# Patient Record
Sex: Male | Born: 1965 | Race: Black or African American | Hispanic: No | Marital: Single | Smoking: Former smoker
Health system: Southern US, Community
[De-identification: ages and names within clinical notes are randomized; demographics above are authoritative.]

## PROBLEM LIST (undated history)

## (undated) DIAGNOSIS — S82892A Other fracture of left lower leg, initial encounter for closed fracture: Secondary | ICD-10-CM

## (undated) DIAGNOSIS — Z7901 Long term (current) use of anticoagulants: Secondary | ICD-10-CM

## (undated) DIAGNOSIS — I10 Essential (primary) hypertension: Secondary | ICD-10-CM

## (undated) DIAGNOSIS — I639 Cerebral infarction, unspecified: Secondary | ICD-10-CM

## (undated) DIAGNOSIS — E109 Type 1 diabetes mellitus without complications: Secondary | ICD-10-CM

## (undated) DIAGNOSIS — I6930 Unspecified sequelae of cerebral infarction: Secondary | ICD-10-CM

## (undated) DIAGNOSIS — Z972 Presence of dental prosthetic device (complete) (partial): Secondary | ICD-10-CM

## (undated) DIAGNOSIS — N183 Chronic kidney disease, stage 3 unspecified: Secondary | ICD-10-CM

## (undated) DIAGNOSIS — M86162 Other acute osteomyelitis, left tibia and fibula: Secondary | ICD-10-CM

## (undated) DIAGNOSIS — E785 Hyperlipidemia, unspecified: Secondary | ICD-10-CM

## (undated) DIAGNOSIS — K08109 Complete loss of teeth, unspecified cause, unspecified class: Secondary | ICD-10-CM

## (undated) HISTORY — PX: MULTIPLE TOOTH EXTRACTIONS: SHX2053

---

## 2001-06-13 HISTORY — PX: CLOSED REDUCTION FINGER WITH PERCUTANEOUS PINNING: SHX5612

## 2008-06-13 DIAGNOSIS — I6930 Unspecified sequelae of cerebral infarction: Secondary | ICD-10-CM

## 2008-06-13 HISTORY — DX: Unspecified sequelae of cerebral infarction: I69.30

## 2008-09-09 DIAGNOSIS — I69959 Hemiplegia and hemiparesis following unspecified cerebrovascular disease affecting unspecified side: Secondary | ICD-10-CM | POA: Insufficient documentation

## 2010-07-14 ENCOUNTER — Encounter (INDEPENDENT_AMBULATORY_CARE_PROVIDER_SITE_OTHER): Payer: Self-pay | Admitting: Internal Medicine

## 2010-07-14 ENCOUNTER — Encounter: Payer: Self-pay | Admitting: Internal Medicine

## 2010-07-14 DIAGNOSIS — I10 Essential (primary) hypertension: Secondary | ICD-10-CM | POA: Insufficient documentation

## 2010-07-14 DIAGNOSIS — N189 Chronic kidney disease, unspecified: Secondary | ICD-10-CM | POA: Insufficient documentation

## 2010-07-14 DIAGNOSIS — E785 Hyperlipidemia, unspecified: Secondary | ICD-10-CM | POA: Insufficient documentation

## 2010-07-14 DIAGNOSIS — E109 Type 1 diabetes mellitus without complications: Secondary | ICD-10-CM | POA: Insufficient documentation

## 2010-07-14 LAB — CONVERTED CEMR LAB
Creatinine, Urine: 114.7 mg/dL
Microalb Creat Ratio: 33.1 mg/g — ABNORMAL HIGH (ref 0.0–30.0)

## 2010-07-20 ENCOUNTER — Encounter (INDEPENDENT_AMBULATORY_CARE_PROVIDER_SITE_OTHER): Payer: Self-pay | Admitting: Internal Medicine

## 2010-07-20 LAB — CONVERTED CEMR LAB
ALT: 39 units/L (ref 0–53)
Basophils Absolute: 0.1 10*3/uL (ref 0.0–0.1)
CO2: 23 meq/L (ref 19–32)
Calcium: 9.9 mg/dL (ref 8.4–10.5)
Chloride: 101 meq/L (ref 96–112)
Cholesterol: 229 mg/dL — ABNORMAL HIGH (ref 0–200)
Creatinine, Ser: 1.82 mg/dL — ABNORMAL HIGH (ref 0.40–1.50)
Hemoglobin: 12.5 g/dL — ABNORMAL LOW (ref 13.0–17.0)
Lymphocytes Relative: 18 % (ref 12–46)
Lymphs Abs: 1.6 10*3/uL (ref 0.7–4.0)
Monocytes Absolute: 0.6 10*3/uL (ref 0.1–1.0)
Monocytes Relative: 7 % (ref 3–12)
Neutro Abs: 6.7 10*3/uL (ref 1.7–7.7)
RBC: 4.46 M/uL (ref 4.22–5.81)
RDW: 13.6 % (ref 11.5–15.5)
Total CHOL/HDL Ratio: 4.2
WBC: 9.1 10*3/uL (ref 4.0–10.5)

## 2010-07-28 ENCOUNTER — Telehealth (INDEPENDENT_AMBULATORY_CARE_PROVIDER_SITE_OTHER): Payer: Self-pay | Admitting: Internal Medicine

## 2010-07-28 DIAGNOSIS — E875 Hyperkalemia: Secondary | ICD-10-CM | POA: Insufficient documentation

## 2010-07-28 DIAGNOSIS — D649 Anemia, unspecified: Secondary | ICD-10-CM | POA: Insufficient documentation

## 2010-08-02 ENCOUNTER — Encounter (INDEPENDENT_AMBULATORY_CARE_PROVIDER_SITE_OTHER): Payer: Self-pay | Admitting: Internal Medicine

## 2010-08-02 ENCOUNTER — Encounter: Payer: Self-pay | Admitting: Internal Medicine

## 2010-08-02 LAB — CONVERTED CEMR LAB
Folate: 15 ng/mL
Glucose, Bld: 169 mg/dL — ABNORMAL HIGH (ref 70–99)
Potassium: 5.5 meq/L — ABNORMAL HIGH (ref 3.5–5.3)
Sodium: 134 meq/L — ABNORMAL LOW (ref 135–145)
TIBC: 364 ug/dL (ref 215–435)
UIBC: 292 ug/dL
Vitamin B-12: 649 pg/mL (ref 211–911)

## 2010-08-04 NOTE — Progress Notes (Signed)
Summary: anemia and hyperkalemia follow up  Phone Note Outgoing Call   Summary of Call: Please schedule pt. to come in for repeat BMET for hyperkalemia and for iron studies--Iron, TIBC and %sat plus Vitamin B12 and folic acid.  Would also like him to get a guaiac card packet to return in 2 weeks.  I do not want him fasting, but well hydrated for the blood draw.  Ask him to  bring in his sugars--please make a copy for me-- and check his bp for that visit as well. Check to see if he has seen or will be seeing Susie soon as well. Initial call taken by: Julieanne Manson MD,  July 28, 2010 11:13 AM  Follow-up for Phone Call        Notified pt. of above info. -- Sched. pt. appt. for 08/02/10 -- Hale Drone CMA  July 28, 2010 2:21 PM   New Problems: HYPERKALEMIA (ICD-276.7) ANEMIA (ICD-285.9)   New Problems: HYPERKALEMIA (ICD-276.7) ANEMIA (ICD-285.9)

## 2010-08-04 NOTE — Assessment & Plan Note (Signed)
Summary: New Pt.    Vital Signs:  Patient profile:   45 year old male Height:      71 inches Weight:      178 pounds BMI:     24.92 Temp:     97.4 degrees F oral Pulse rate:   88 / minute Pulse rhythm:   regular Resp:     16 per minute BP sitting:   108 / 68  (left arm) Cuff size:   regular  Vitals Entered By: Hale Drone CMA (July 14, 2010 10:06 AM) CC: Here to establish.  Is Patient Diabetic? Yes Pain Assessment Patient in pain? no      CBG Result 519 CBG Device ID A non fasting  Does patient need assistance? Functional Status Self care Ambulation Normal   CC:  Here to establish. .  History of Present Illness: 45 yo male here to establish.  1. IDDM:  diagnosed at age 16.  Last A1C was 7.1%.  Pt. states was in Sept of 2011, just before released from prison.  States his diet changed a lot after out of prison.  Has tried to get back to a better diet.  Denies peripheral neuropathy hx or symptoms, no gastroparesis.  Pt. denies hx of CAD.  May have some diabetic kidney involvement.  Last eye exam 2010, not clear if any diabetic changes, but has not required any laser treatments.  Sugars not checked much recently as was out of strips.    2.  ?CRI:  states his numbers were a bit up in recent years.  3.  Hypertension:  has been controlled with meds when he does not run out.  Dx in 2005.  Exercises regularly.  Trying to eat well.  4.  4 Strokes in 2010.  3/30, 9/28, 10/4 and 10/5 per pt.  Each time had right sided weakness.  Also with dysarthria, and mild dysphagia.  Does not sound like he had any therapy provided to improve his situation, he just sort of did this on his own.    5.  Hyperlipidemia:  States was at goal when last checked in 2011.  Current Medications (verified): 1)  Triamterene-Hctz 37.5-25 Mg Tabs (Triamterene-Hctz) .Marland Kitchen.. 1 Tab By Mouth Daily --Pr. Sami Hassan 2)  Lovastatin 20 Mg Tabs (Lovastatin) .Marland Kitchen.. 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  2003:  pin of fracture of left ring finger--[place], [place]  Family History: Mother, 19:  Stroke, DM, Htn Father, died 59:  Killed in MVA right after pt. born 37 Brothers:  1 died age 87 of brain cancer.  Others all healthy 2 Sisters:  1 died age 16 of ruptured brain aneurysm.  Other is healthy No children.  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  Occasional MJ in past only.  No IV drugs  Physical Exam  General:  NAD Lungs:  Normal respiratory effort, chest expands symmetrically. Lungs are clear to auscultation, no crackles or wheezes. Heart:  Normal rate and regular rhythm. S1 and S2 normal without gallop, murmur, click, rub or other extra sounds.  Radial pulses normal and equal Extremities:  No edema  Diabetes Management Exam:    Foot Exam (with socks and/or shoes not present):       Sensory-Monofilament:          Left foot: normal          Right foot: normal   Impression & Recommendations:  Problem # 1:  IDDM (ICD-250.01)  A1C pending His updated medication list for this problem includes:    Aspirin 325 Mg Tabs (Aspirin) .Marland Kitchen... 1 tab by mouth daily with food    Accupril 10 Mg Tabs (Quinapril hcl) .Marland Kitchen... 1 tab by mouth daily    Novolin R 100 Unit/ml Soln (Insulin regular human) ..... Before a meal subcutaneously  three times a day  200-250:  2 units   251-300:  4 units 201-350:  6 units 351-400:  8 units 401-450:  10 units etc    Novolin N 100 Unit/ml Susp (Insulin isophane human) .Marland Kitchen... 20 units subcutaneously in morning before breakfast and 10 units subcutaneously in evening before dinner  Orders: Capillary Blood Glucose/CBG (82948) Hemoglobin A1C (83036) T-Urine Microalbumin w/creat. ratio [phone]) Insulin 5 units (J1815)  Problem # 2:  HYPERTENSION (ICD-401.9) Controlled Switching to Accupril so can get free of charge His updated medication list for this problem includes:    Triamterene-hctz 37.5-25 Mg Tabs (Triamterene-hctz) .Marland Kitchen... 1 tab by mouth daily --pr. sami hassan    Accupril 10 Mg Tabs (Quinapril hcl) .Marland Kitchen... 1 tab by mouth daily  Problem # 3:  RENAL INSUFFICIENCY, CHRONIC (ICD-585.9) Return for labs  Problem # 4:  HYPERLIPIDEMIA (ICD-272.4) Switching to Lipitor--but not clear when will be able to get His updated medication list for this problem includes:    Lipitor 10 Mg Tabs (Atorvastatin calcium) .Marland Kitchen... 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 1 tab by mouth daily    Aspirin 325 Mg Tabs (Aspirin) .Marland Kitchen... 1 tab by mouth daily with food  Complete Medication List: 1)  Triamterene-hctz 37.5-25 Mg Tabs (Triamterene-hctz) .Marland Kitchen.. 1 tab by mouth daily --pr. sami hassan 2)  Lipitor 10 Mg Tabs (Atorvastatin calcium) .Marland Kitchen.. 1 tab by mouth daily 3)  Plavix 75 Mg Tabs (Clopidogrel bisulfate) .Marland Kitchen.. 1 tab by mouth daily 4)  Aspirin 325 Mg Tabs (Aspirin) .Marland Kitchen.. 1 tab by mouth daily with food 5)  Accupril 10 Mg Tabs (Quinapril hcl) .Marland Kitchen.. 1 tab by mouth daily 6)  Katheren Puller Presto Test Strp (Glucose blood) .... Two times a day blood sugar checks 7)  Lancets Misc (Lancets) .... Two times a day blood sugar checks 8)  Novolin R 100 Unit/ml Soln  (Insulin regular human) .... Before a meal subcutaneously three times a day  200-250:  2 units   251-300:  4 units 201-350:  6 units 351-400:  8 units 401-450:  10 units etc 9)  Novolin N 100 Unit/ml Susp (Insulin isophane human) .... 20 units subcutaneously in morning before breakfast and 10 units subcutaneously in evening before dinner  Other Orders: Flu Vaccine 48yrs + (29562) Admin 1st Vaccine (13086) Pneumococcal Vaccine (57846) Admin of Any Addtl Vaccine (96295)  Patient Instructions: 1)  Referral to  Susie Piper for diabetic education 2)  Schedule Retasure 3)  Follow up with Dr. Delrae Alfred in 3 months for CPE--address teeth then 4)  Schedule fasting labs in next 2 weeks:  FLP, CMET, CBC Prescriptions: NOVOLIN N 100 UNIT/ML SUSP (INSULIN ISOPHANE HUMAN) 20 units subcutaneously in morning before breakfast and 10 units subcutaneously in evening before dinner  #1 month x 11   Entered and Authorized by:   Julieanne Manson MD   Signed by:   Julieanne Manson MD on 07/14/2010   Method used:   Print then Give to Patient   RxID:   [id number] NOVOLIN R 100 UNIT/ML SOLN (INSULIN REGULAR HUMAN) Before a meal subcutaneously three times a day  200-250:  2 units   251-300:  4 units 201-350:  6 units 351-400:  8 units 401-450:  10 units etc  #1 month x 11   Entered and Authorized by:   Julieanne Manson MD   Signed by:   Julieanne Manson MD on 07/14/2010   Method used:   Print then Give to Patient   RxID:   [id number] TRIAMTERENE-HCTZ 37.5-25 MG TABS (TRIAMTERENE-HCTZ) 1 tab by mouth daily --Pr. Sami Roseanne Reno  #30 x 11   Entered and Authorized by:   Julieanne Manson MD   Signed by:   Julieanne Manson MD on 07/14/2010   Method used:   Print then Give to Patient   RxID:   [id number] LANCETS  MISC (LANCETS) two times a day blood sugar checks  #75 x 11   Entered and Authorized by:   Julieanne Manson MD   Signed by:   Julieanne Manson MD on 07/14/2010   Method used:    Print then Give to Patient   RxID:   [id number] WAVESENSE PRESTO TEST  STRP (GLUCOSE BLOOD) two times a day blood sugar checks  #75 x 11   Entered and Authorized by:   Julieanne Manson MD   Signed by:   Julieanne Manson MD on 07/14/2010   Method used:   Print then Give to Patient   RxID:   [id number] ACCUPRIL 10 MG TABS (QUINAPRIL HCL) 1 tab by mouth daily  #30 x 11   Entered and Authorized by:   Julieanne Manson MD   Signed by:   Julieanne Manson MD on 07/14/2010   Method used:   Print then Give to Patient   RxID:   [id number] ASPIRIN 325 MG TABS (ASPIRIN) 1 tab by mouth daily with food  #30 x 11   Entered and Authorized by:   Julieanne Manson MD   Signed by:   Julieanne Manson MD on 07/14/2010   Method used:   Print then Give to Patient   RxID:   [id number] LIPITOR 10 MG TABS (ATORVASTATIN CALCIUM) 1 tab by mouth daily  #30 x 11   Entered and Authorized by:   Julieanne Manson MD   Signed by:   Julieanne Manson MD on 07/14/2010   Method used:   Print then Give to Patient   RxID:   [id number] PLAVIX 75 MG TABS (CLOPIDOGREL BISULFATE) 1 tab by mouth daily  #30 x 11   Entered and Authorized by:   Julieanne Manson MD   Signed by:   Julieanne Manson MD on 07/14/2010   Method used:   Print then Give to Patient   RxID:   [id number]    Medication Administration  Injection # 1:    Medication: Insulin 5 units    Diagnosis: IDDM (ICD-250.01)    Route: SQ  Site: R thigh    Comments: Pt. gave himself 10 units of Novalin while in the office.     Patient tolerated injection without complications  Orders Added: 1)  Flu Vaccine 56yrs + [90658] 2)  Admin 1st Vaccine [90471] 3)  New Patient Level III [98119] 4)  Capillary Blood Glucose/CBG [82948] 5)  Hemoglobin A1C [83036] 6)  T-Urine Microalbumin w/creat. ratio [82043-[address]] 7)  Insulin 5 units [J1815] 8)  Pneumococcal Vaccine [90732] 9)  Admin of Any Addtl Vaccine  [90472]   Immunization History:  Tetanus/Td Immunization History:    Tetanus/Td:  historical (06/13/2006)  Immunizations Administered:  Influenza Vaccine # 1:    Vaccine Type: Fluvax 3+    Site: left deltoid    Mfr: GlaxoSmithKline    Dose: 0.5 ml    Route: IM    Given by: Hale Drone CMA    Exp. Date: 12/11/2010    Lot #: [id number]    VIS given: 01/05/10 version given July 14, 2010.  Pneumonia Vaccine:    Vaccine Type: Pneumovax    Site: right deltoid    Mfr: Merck    Dose: 0.5 ml    Route: IM    Given by: Hale Drone CMA    Exp. Date: 10/23/2011    Lot #: [id number]    VIS given: 05/18/09 version given July 14, 2010.  Flu Vaccine Consent Questions:    Do you have a history of severe allergic reactions to this vaccine? no    Any prior history of allergic reactions to egg and/or gelatin? no    Do you have a sensitivity to the preservative Thimersol? no    Do you have a past history of Guillan-Barre Syndrome? no    Do you currently have an acute febrile illness? no    Have you ever had a severe reaction to latex? no    Vaccine information given and explained to patient? yes   Immunization History:  Tetanus/Td Immunization History:    Tetanus/Td:  Historical (06/13/2006)  Immunizations Administered:  Influenza Vaccine # 1:    Vaccine Type: Fluvax 3+    Site: left deltoid    Mfr: GlaxoSmithKline    Dose: 0.5 ml    Route: IM    Given by: Hale Drone CMA    Exp. Date: 12/11/2010    Lot #: [id number]    VIS given: 01/05/10 version given July 14, 2010.  Pneumonia Vaccine:    Vaccine Type: Pneumovax    Site: right deltoid    Mfr: Merck    Dose: 0.5 ml    Route: IM    Given by: Hale Drone CMA    Exp. Date: 10/23/2011    Lot #: [id number]    VIS given: 05/18/09 version given July 14, 2010.     Diabetic Foot Exam    10-g (5.07) Semmes-Weinstein Monofilament Test Performed by: Hale Drone CMA          Right Foot          Left Foot Visual  Inspection     normal         normal Test Control      normal         normal Site 1         normal         normal Site 2         normal         normal Site 3  normal         normal Site 4         normal         normal Site 5         normal         normal Site 6         normal         normal Site 7         normal         normal Site 8         normal         normal Site 9         normal         normal Site 10         normal         normal  Impression      normal         normal  Laboratory Results   Blood Tests   Date/Time Received: July 14, 2010 12:05 PM   HGBA1C: 13.3%   (Normal Range: Non-Diabetic - 3-6%   Control Diabetic - 6-8%) CBG Random:: 519mg /dL

## 2010-08-26 ENCOUNTER — Telehealth (INDEPENDENT_AMBULATORY_CARE_PROVIDER_SITE_OTHER): Payer: Self-pay | Admitting: Internal Medicine

## 2010-08-31 NOTE — Assessment & Plan Note (Signed)
Summary: BMET, Iron, TIBC %sat, Vitamin B12, Folic Acid  Nurse Visit   Vital Signs:  Patient profile:   45 year old male BP sitting:   118 / 82  (left arm)  Vitals Entered By: Hale Drone CMA (August 02, 2010 9:35 AM) CC: Pt. came in. Takes his BP med daily in the morning. Did not take it this morning when BP read today. Did not bring in his sugars. He states he saw Sussie on 07/20/10. Has no f/u w/her but states they just talked about eating healthier which he knew for the most part.    Allergies: No Known Drug Allergies  Orders Added: 1)  Est. Patient Level I [99211] 2)  T-Basic Metabolic Panel [80048-22910] 3)  T-Folic Acid; RBC [82747-23360] 4)  T-Iron Binding Capacity (TIBC) [[address]] 5)  T-Vitamin B12 [82607-23330]

## 2010-09-09 NOTE — Progress Notes (Signed)
Summary: Change to Amlodipine  Phone Note Outgoing Call   Summary of Call: Let pt. know that his potassium is better, but still a bit higher than I would like to see it.   Would like to switch out his Accupril for another blood pressure medication and see if it makes a difference with both his kidney function and his potassium.  May also need to consider a change in his water pill--but one change at a time.  Have him stop Accupril and get started on Amlodipine ASAP Schedule bp check with BMET 2 weeks after the change in meds. Make sure he is not using salt substitute that has potassium in it or any other potassium supplement.. Needs to get stool cards in. Also, his cholesterol was a bit high--will address that once bp and potassium is regulated  Initial call taken by: Julieanne Manson MD,  August 26, 2010 4:08 PM  Follow-up for Phone Call        Pt. notified of lab results, change in meds, new Rx, provider recommendations/instructions.  Verbalized understanding and agreement.  Lab appt., scheduled 09/21/10. Follow-up by: Dutch Quint RN,  September 01, 2010 12:18 PM    New/Updated Medications: AMLODIPINE BESYLATE 10 MG TABS (AMLODIPINE BESYLATE) 1 tab by mouth daily Prescriptions: AMLODIPINE BESYLATE 10 MG TABS (AMLODIPINE BESYLATE) 1 tab by mouth daily  #30 x 11   Entered and Authorized by:   Julieanne Manson MD   Signed by:   Julieanne Manson MD on 08/26/2010   Method used:   Printed then faxed to ...       [facility] Department (retail)       [address] [place], [place]  [address]       Ph: [phone]       Fax: [phone]   RxID:   [id number]

## 2011-03-03 ENCOUNTER — Other Ambulatory Visit: Payer: Self-pay | Admitting: Nephrology

## 2011-03-07 ENCOUNTER — Ambulatory Visit
Admission: RE | Admit: 2011-03-07 | Discharge: 2011-03-07 | Disposition: A | Discharge status: Home / Self Care | Payer: Medicaid Other | Source: Ambulatory Visit | Attending: Nephrology | Admitting: Nephrology

## 2011-03-16 ENCOUNTER — Encounter (HOSPITAL_COMMUNITY)
Admission: RE | Admit: 2011-03-16 | Discharge: 2011-03-16 | Disposition: A | Discharge status: Home / Self Care | Payer: Medicaid Other | Source: Ambulatory Visit | Attending: Oral Surgery | Admitting: Oral Surgery

## 2011-03-16 ENCOUNTER — Ambulatory Visit (HOSPITAL_COMMUNITY)
Admission: RE | Admit: 2011-03-16 | Discharge: 2011-03-16 | Disposition: A | Discharge status: Home / Self Care | Payer: Medicaid Other | Source: Ambulatory Visit | Attending: Oral Surgery | Admitting: Oral Surgery

## 2011-03-16 ENCOUNTER — Other Ambulatory Visit (HOSPITAL_COMMUNITY): Payer: Self-pay | Admitting: Oral Surgery

## 2011-03-16 DIAGNOSIS — K0889 Other specified disorders of teeth and supporting structures: Secondary | ICD-10-CM

## 2011-03-16 DIAGNOSIS — Z01811 Encounter for preprocedural respiratory examination: Secondary | ICD-10-CM | POA: Insufficient documentation

## 2011-03-16 DIAGNOSIS — Z01810 Encounter for preprocedural cardiovascular examination: Secondary | ICD-10-CM | POA: Insufficient documentation

## 2011-03-16 DIAGNOSIS — Z01812 Encounter for preprocedural laboratory examination: Secondary | ICD-10-CM | POA: Insufficient documentation

## 2011-03-16 DIAGNOSIS — I10 Essential (primary) hypertension: Secondary | ICD-10-CM | POA: Insufficient documentation

## 2011-03-16 LAB — BASIC METABOLIC PANEL
BUN: 25 mg/dL — ABNORMAL HIGH (ref 6–23)
Calcium: 9.6 mg/dL (ref 8.4–10.5)
Creatinine, Ser: 2.03 mg/dL — ABNORMAL HIGH (ref 0.50–1.35)
GFR calc Af Amer: 44 mL/min — ABNORMAL LOW (ref >90)
GFR calc non Af Amer: 38 mL/min — ABNORMAL LOW (ref >90)

## 2011-03-16 LAB — DIFFERENTIAL
Basophils Absolute: 0.1 10*3/uL (ref 0.0–0.1)
Lymphocytes Relative: 23 % (ref 12–46)
Monocytes Relative: 8 % (ref 3–12)
Neutrophils Relative %: 67 % (ref 43–77)

## 2011-03-16 LAB — CBC
HCT: 36.5 % — ABNORMAL LOW (ref 39.0–52.0)
MCHC: 34 g/dL (ref 30.0–36.0)
MCV: 84.1 fL (ref 78.0–100.0)
Platelets: 317 10*3/uL (ref 150–400)
RDW: 14.1 % (ref 11.5–15.5)

## 2011-03-22 ENCOUNTER — Ambulatory Visit (HOSPITAL_COMMUNITY)
Admission: RE | Admit: 2011-03-22 | Discharge: 2011-03-22 | Disposition: A | Discharge status: Home / Self Care | Payer: Medicaid Other | Source: Ambulatory Visit | Attending: Oral Surgery | Admitting: Oral Surgery

## 2011-03-22 DIAGNOSIS — I129 Hypertensive chronic kidney disease with stage 1 through stage 4 chronic kidney disease, or unspecified chronic kidney disease: Secondary | ICD-10-CM | POA: Insufficient documentation

## 2011-03-22 DIAGNOSIS — E119 Type 2 diabetes mellitus without complications: Secondary | ICD-10-CM | POA: Insufficient documentation

## 2011-03-22 DIAGNOSIS — N189 Chronic kidney disease, unspecified: Secondary | ICD-10-CM | POA: Insufficient documentation

## 2011-03-22 DIAGNOSIS — M278 Other specified diseases of jaws: Secondary | ICD-10-CM | POA: Insufficient documentation

## 2011-03-22 DIAGNOSIS — K069 Disorder of gingiva and edentulous alveolar ridge, unspecified: Secondary | ICD-10-CM | POA: Insufficient documentation

## 2011-03-22 DIAGNOSIS — K056 Periodontal disease, unspecified: Secondary | ICD-10-CM | POA: Insufficient documentation

## 2011-03-22 DIAGNOSIS — I69959 Hemiplegia and hemiparesis following unspecified cerebrovascular disease affecting unspecified side: Secondary | ICD-10-CM | POA: Insufficient documentation

## 2011-03-22 NOTE — Op Note (Signed)
NAME:  Cesar Ali, Cesar Ali NO.:  [id number]  MEDICAL RECORD NO.:  [id number]  LOCATION:  [facility]                         FACILITY:  [facility]  PHYSICIAN:  Georgia Lopes, M.D.  DATE OF BIRTH:  05-18-1966  DATE OF PROCEDURE:  03/22/2011 DATE OF DISCHARGE:                              OPERATIVE REPORT   PREOPERATIVE DIAGNOSIS:  Nonrestorable teeth  numbers 6, 7, 8, 10, 11, H, 17, 21, 22, 23, 26, 27, 28, 29, 30, right mandibular torus.  POSTOPERATIVE DIAGNOSIS:  Nonrestorable teeth numbers 6, 7, 8, 10, 11, H, 17, 21, 22, 23, 26, 27, 28, 29, 30, right mandibular torus.  PROCEDURES:  Removal of teeth numbers per above, alveoplasty maxilla mandible, removal of right mandibular torus.  SURGEON:  Georgia Lopes, MD  ANESTHESIA:  General, Dr. Sondra Come attending nasal intubation.  ASSISTANTS:  Genia Harold.  INDICATIONS FOR PROCEDURE:  The patient is a 45 year old male who is referred to me by his general dentist for removal of all remaining teeth secondary to severe periodontal disease.  Cesar Ali has multiple nonrestorable teeth and is diabetic with status post CVA with renal disease.  Because of the edematous and inflamed condition of the gingiva and multiple teeth to be removed as well as mandibular torus.  General anesthesia was recommended in hospital was chosen because of the patient's medical comorbidities.  PROCEDURE:  The patient was taken to the operating room, placed on the table in supine position.  General anesthesia was administered intravenously and a nasal endotracheal tube was placed and secured.  The eyes were protected.  The patient was draped for the procedure.  The posterior pharynx was suctioned and a throat pack was placed.  2% lidocaine 1:100,000 epinephrine was infiltrated in the inferior and alveolar block on the right and left side and buccal and palatal infiltration in the maxilla and in buccal anterior, filtration in mandible total of 17 mL was  utilized.  A bite block was placed to the right side of the mouth and the left side was operated first.  A 15 blade was used to make a full-thickness incision around tooth #17 extending anteriorly to teeth #21, 22, 23.  Incisions around these teeth included approximately 4-5 mm of edematous inflamed gingival tissue. The periosteum was reflected and then the teeth were removed with the lower universal and Ash forceps.  The sockets were then curetted, gross edematous and inflamed material was removed with curettes and rongeur, then the periosteum was reflected buccally and lingually and an alveoplasty was performed with the egg-shaped bur and a bone file. Then, the area was irrigated and closed with 3-0 chromic in the left maxilla.  A 15 blade was used to make a full-thickness incision around teeth #10, 11, and H.  An elliptical incision was made which included approximately 4-5 mm gingival tissue which was severely inflamed.  This incision was carried out to the other side of the mouth around teeth #6, 7, 8, and the tissue associated with them was inflamed and necrotic. The periosteum was reflected.  The teeth were removed with the rongeur as was the inflamed gingival tissue.  The sockets were curetted.  The alveoplasty was performed with an  egg-shaped bur and the bone file, then the areas were irrigated and closed with 3-0 chromic.  The bite block was exchanged for a larger bite block and placed in the left side of the mouth and sweetheart was replaced to retract the tongue.  A 15 blade was used to make an incision around teeth #26, 27, 28, 29, 30 including the edematous inflammatory gingiva approximately 4-5 mm below the margins of the teeth on the buccal and lingual surfaces.  Then, the teeth were removed with the universal and Ash forceps.  No elevation was necessary for any of the teeth as they were all grossly mobile, with the exception of the 2 lower molars.  The inflamed gingival  tissue was removed.  The sockets were curetted.  The periosteum was further reflected to allow access to the lingual torus and the alveolar bone.  The egg-shaped bur and bone file were used to remove the torus using the Seldin elevator to reflect the lingual tissue to avoid injury and the alveoplasty was performed with the egg-shaped bur and the bone file as well.  The sockets were then irrigated and closed with 3-0 chromic.  The oral cavity was then inspected, found to have good closure contour and hemostasis.  The patient was irrigated and suctioned.  Throat pack was removed.  The patient was awaken and taken to the recovery room, breathing spontaneously in good condition.  EBL:  Minimum.  COMPLICATIONS:  None.  SPECIMENS:  None.  FINDINGS:  Grossly edematous inflamed gingival tissue.     Georgia Lopes, M.D.     SMJ/MEDQ  D:  03/22/2011  T:  03/22/2011  Job:  [id number]  Electronically Signed by Ocie Doyne M.D. on 03/22/2011 08:24:26 PM

## 2011-10-02 ENCOUNTER — Emergency Department (HOSPITAL_COMMUNITY)
Admission: EM | Admit: 2011-10-02 | Discharge: 2011-10-02 | Disposition: A | Discharge status: Home / Self Care | Payer: Medicaid Other | Attending: Emergency Medicine | Admitting: Emergency Medicine

## 2011-10-02 ENCOUNTER — Encounter (HOSPITAL_COMMUNITY): Payer: Self-pay | Admitting: Emergency Medicine

## 2011-10-02 DIAGNOSIS — Z794 Long term (current) use of insulin: Secondary | ICD-10-CM | POA: Insufficient documentation

## 2011-10-02 DIAGNOSIS — E119 Type 2 diabetes mellitus without complications: Secondary | ICD-10-CM | POA: Insufficient documentation

## 2011-10-02 DIAGNOSIS — H539 Unspecified visual disturbance: Secondary | ICD-10-CM | POA: Insufficient documentation

## 2011-10-02 DIAGNOSIS — H4312 Vitreous hemorrhage, left eye: Secondary | ICD-10-CM

## 2011-10-02 DIAGNOSIS — I10 Essential (primary) hypertension: Secondary | ICD-10-CM | POA: Insufficient documentation

## 2011-10-02 DIAGNOSIS — H4310 Vitreous hemorrhage, unspecified eye: Secondary | ICD-10-CM | POA: Insufficient documentation

## 2011-10-02 HISTORY — DX: Essential (primary) hypertension: I10

## 2011-10-02 HISTORY — DX: Cerebral infarction, unspecified: I63.9

## 2011-10-02 NOTE — ED Notes (Signed)
C/o blurred vision to L eye since Friday.

## 2011-10-02 NOTE — ED Provider Notes (Signed)
History     CSN: [id number]  Arrival date & time 10/02/11  2000   First MD Initiated Contact with Patient 10/02/11 2044      Chief Complaint  Patient presents with  . Eye Problem    (Consider location/radiation/quality/duration/timing/severity/associated sxs/prior treatment) HPI Comments: Pt presents with a 3 day hx of worsening, gradual vision loss to left eye.  Says that he feels like his vision is cloudy and sees strands in eye.  No pain.  No drainage.  No known injury to eye.  No headache or vomiting.  Has hx of laser surgery for diabetic retinopathy to both eyes.  The history is provided by the patient.    Past Medical History  Diagnosis Date  . Hypertension   . Stroke   . High cholesterol   . Diabetes mellitus     Past Surgical History  Procedure Date  . Eye surgery   . Multiple tooth extractions     No family history on file.  History  Substance Use Topics  . Smoking status: Former Games developer  . Smokeless tobacco: Not on file  . Alcohol Use: No      Review of Systems  Constitutional: Negative for fever, chills, diaphoresis and fatigue.  HENT: Negative for congestion, rhinorrhea and sneezing.   Eyes: Positive for visual disturbance. Negative for photophobia, pain, discharge, redness and itching.  Respiratory: Negative for cough, chest tightness and shortness of breath.   Cardiovascular: Negative for chest pain and leg swelling.  Gastrointestinal: Negative for nausea, vomiting, abdominal pain, diarrhea and blood in stool.  Genitourinary: Negative for frequency, hematuria, flank pain and difficulty urinating.  Musculoskeletal: Negative for back pain and arthralgias.  Skin: Negative for rash.  Neurological: Negative for dizziness, speech difficulty, weakness, numbness and headaches.    Allergies  Review of patient's allergies indicates no known allergies.  Home Medications   Current Outpatient Rx  Name Route Sig Dispense Refill  . AMLODIPINE BESYLATE 10  MG PO TABS Oral Take 10 mg by mouth daily.    . ATORVASTATIN CALCIUM 10 MG PO TABS Oral Take 10 mg by mouth daily.    Marland Kitchen CLOPIDOGREL BISULFATE 75 MG PO TABS Oral Take 75 mg by mouth daily.    . FUROSEMIDE 40 MG PO TABS Oral Take 40 mg by mouth daily.    . INSULIN ISOPHANE HUMAN 100 UNIT/ML [place] SUSP Subcutaneous Inject 20-25 Units into the skin 2 (two) times daily. Inject 25 units in the morning, and 20 units in the evening.    . INSULIN ISOPHANE HUMAN 100 UNIT/ML [place] SUSP Subcutaneous Inject 2-8 Units into the skin as needed. 200-250= 2 units 251-300= 4 units 301-350= 6 units 351-400= 8 units    . LISINOPRIL 10 MG PO TABS Oral Take 10 mg by mouth daily.      BP 146/79  Pulse 76  Temp(Src) 97.6 F (36.4 C) (Oral)  Resp 18  SpO2 100%  Physical Exam  Constitutional: He is oriented to person, place, and time. He appears well-developed and well-nourished.  Eyes: Conjunctivae and EOM are normal. Pupils are equal, round, and reactive to light.       Pressure 19 to left eye.  Visual acuity 20/400 in left. 20/30 right  Neurological: He is alert and oriented to person, place, and time.  Skin: Skin is warm and dry.    ED Course  Procedures (including critical care time)  Labs Reviewed - No data to display No results found.   1. Vitreous hemorrhage  of right eye       MDM  Pt with gradual loss of vision/cloudiness to left eye over last 3 days.  Normal pressures.  Spoke with Dr. Randon Goldsmith who felt that this is likely vitreous hemorrhage.  Will need to f/u with his eye doctor or Dr. Randon Goldsmith within the next 1-3 days.  Advised pt of this and to sleep with his head elevated.        Rolan Bucco, MD 10/03/11 [phone]

## 2011-10-02 NOTE — ED Notes (Addendum)
The patient states that he has vision loss in his left eye.  He denies getting a foreign body in his eye.  Denies getting any chemicals, grooming products, etc. In his eye.  The patient denies any known trauma.  States he had corrective laser eye surgery about 4-6 months ago on both eyes and that his vision has been "normal" since after his surgical recovery period.  The patient states that he had four strokes in 2010 which were all located on the right side of his brain.

## 2011-12-29 ENCOUNTER — Ambulatory Visit: Payer: Medicaid Other | Attending: Internal Medicine | Admitting: Physical Therapy

## 2012-01-25 ENCOUNTER — Ambulatory Visit: Payer: Medicaid Other | Attending: Internal Medicine | Admitting: Occupational Therapy

## 2012-01-25 DIAGNOSIS — M6281 Muscle weakness (generalized): Secondary | ICD-10-CM | POA: Insufficient documentation

## 2012-01-25 DIAGNOSIS — R279 Unspecified lack of coordination: Secondary | ICD-10-CM | POA: Insufficient documentation

## 2012-01-25 DIAGNOSIS — IMO0001 Reserved for inherently not codable concepts without codable children: Secondary | ICD-10-CM | POA: Insufficient documentation

## 2012-01-25 DIAGNOSIS — I69998 Other sequelae following unspecified cerebrovascular disease: Secondary | ICD-10-CM | POA: Insufficient documentation

## 2012-01-30 ENCOUNTER — Other Ambulatory Visit: Payer: Self-pay | Admitting: Ophthalmology

## 2012-01-30 ENCOUNTER — Encounter (HOSPITAL_COMMUNITY): Payer: Self-pay | Admitting: Pharmacy Technician

## 2012-01-31 ENCOUNTER — Encounter (HOSPITAL_COMMUNITY): Payer: Self-pay

## 2012-01-31 MED ORDER — PREDNISOLONE ACETATE 1 % OP SUSP
1.0000 [drp] | OPHTHALMIC | Status: AC
  Administered 2012-02-01: 1 [drp] via OPHTHALMIC
  Filled 2012-01-31: qty 5

## 2012-01-31 MED ORDER — PHENYLEPHRINE HCL 2.5 % OP SOLN
1.0000 [drp] | OPHTHALMIC | Status: AC | PRN
  Administered 2012-02-01 (×3): 1 [drp] via OPHTHALMIC
  Filled 2012-01-31: qty 3

## 2012-01-31 MED ORDER — GATIFLOXACIN 0.5 % OP SOLN
1.0000 [drp] | OPHTHALMIC | Status: AC | PRN
  Administered 2012-02-01 (×3): 1 [drp] via OPHTHALMIC
  Filled 2012-01-31: qty 2.5

## 2012-01-31 MED ORDER — TETRACAINE HCL 0.5 % OP SOLN
2.0000 [drp] | OPHTHALMIC | Status: AC
  Administered 2012-02-01: 2 [drp] via OPHTHALMIC
  Filled 2012-01-31: qty 2

## 2012-01-31 NOTE — Progress Notes (Signed)
Contacted Dr. Clarisa Kindred regarding patients plavix, stated patient to stop plavix on day of surgery and first week following surgery.  Patient given instructions per Dr. Clarisa Kindred.

## 2012-02-01 ENCOUNTER — Encounter (HOSPITAL_COMMUNITY): Payer: Self-pay | Admitting: Certified Registered Nurse Anesthetist

## 2012-02-01 ENCOUNTER — Ambulatory Visit (HOSPITAL_COMMUNITY): Payer: Medicaid Other | Admitting: Certified Registered Nurse Anesthetist

## 2012-02-01 ENCOUNTER — Ambulatory Visit (HOSPITAL_COMMUNITY)
Admission: RE | Admit: 2012-02-01 | Discharge: 2012-02-01 | Disposition: A | Discharge status: Home / Self Care | Payer: Medicaid Other | Source: Ambulatory Visit | Attending: Ophthalmology | Admitting: Ophthalmology

## 2012-02-01 ENCOUNTER — Encounter (HOSPITAL_COMMUNITY): Payer: Self-pay | Admitting: *Deleted

## 2012-02-01 ENCOUNTER — Encounter (HOSPITAL_COMMUNITY)
Admission: RE | Disposition: A | Discharge status: Home / Self Care | Payer: Self-pay | Source: Ambulatory Visit | Attending: Ophthalmology

## 2012-02-01 DIAGNOSIS — Z8673 Personal history of transient ischemic attack (TIA), and cerebral infarction without residual deficits: Secondary | ICD-10-CM | POA: Insufficient documentation

## 2012-02-01 DIAGNOSIS — E7800 Pure hypercholesterolemia, unspecified: Secondary | ICD-10-CM | POA: Insufficient documentation

## 2012-02-01 DIAGNOSIS — I10 Essential (primary) hypertension: Secondary | ICD-10-CM | POA: Insufficient documentation

## 2012-02-01 DIAGNOSIS — E1139 Type 2 diabetes mellitus with other diabetic ophthalmic complication: Secondary | ICD-10-CM | POA: Insufficient documentation

## 2012-02-01 DIAGNOSIS — Z794 Long term (current) use of insulin: Secondary | ICD-10-CM | POA: Insufficient documentation

## 2012-02-01 DIAGNOSIS — E11359 Type 2 diabetes mellitus with proliferative diabetic retinopathy without macular edema: Secondary | ICD-10-CM | POA: Insufficient documentation

## 2012-02-01 HISTORY — PX: PARS PLANA VITRECTOMY: SHX2166

## 2012-02-01 LAB — CBC
HCT: 38.5 % — ABNORMAL LOW (ref 39.0–52.0)
MCH: 28 pg (ref 26.0–34.0)
MCV: 83.7 fL (ref 78.0–100.0)
Platelets: 329 10*3/uL (ref 150–400)
RDW: 13.5 % (ref 11.5–15.5)

## 2012-02-01 LAB — BASIC METABOLIC PANEL
BUN: 20 mg/dL (ref 6–23)
CO2: 27 mEq/L (ref 19–32)
Calcium: 9.8 mg/dL (ref 8.4–10.5)
Chloride: 106 mEq/L (ref 96–112)
Creatinine, Ser: 1.87 mg/dL — ABNORMAL HIGH (ref 0.50–1.35)
Glucose, Bld: 180 mg/dL — ABNORMAL HIGH (ref 70–99)

## 2012-02-01 LAB — GLUCOSE, CAPILLARY
Glucose-Capillary: 168 mg/dL — ABNORMAL HIGH (ref 70–99)
Glucose-Capillary: 134 mg/dL — ABNORMAL HIGH (ref 70–99)

## 2012-02-01 SURGERY — PARS PLANA VITRECTOMY WITH 23 GAUGE
Anesthesia: General | Site: Eye | Laterality: Left | Wound class: Clean Contaminated

## 2012-02-01 MED ORDER — EPINEPHRINE HCL 1 MG/ML IJ SOLN
INTRAMUSCULAR | Status: AC
  Filled 2012-02-01: qty 1

## 2012-02-01 MED ORDER — LIDOCAINE HCL (CARDIAC) 20 MG/ML IV SOLN
INTRAVENOUS | Status: DC | PRN
  Administered 2012-02-01: 100 mg via INTRAVENOUS

## 2012-02-01 MED ORDER — EPINEPHRINE HCL 1 MG/ML IJ SOLN
INTRAMUSCULAR | Status: DC | PRN
  Administered 2012-02-01: .3 mL

## 2012-02-01 MED ORDER — BSS IO SOLN
INTRAOCULAR | Status: DC | PRN
  Administered 2012-02-01: 15 mL via INTRAOCULAR

## 2012-02-01 MED ORDER — PROPOFOL 10 MG/ML IV EMUL
INTRAVENOUS | Status: DC | PRN
  Administered 2012-02-01: 140 mg via INTRAVENOUS

## 2012-02-01 MED ORDER — BSS PLUS IO SOLN
INTRAOCULAR | Status: DC | PRN
  Administered 2012-02-01: 1 via INTRAOCULAR

## 2012-02-01 MED ORDER — FENTANYL CITRATE 0.05 MG/ML IJ SOLN
INTRAMUSCULAR | Status: DC | PRN
  Administered 2012-02-01: 100 ug via INTRAVENOUS

## 2012-02-01 MED ORDER — BSS PLUS IO SOLN
INTRAOCULAR | Status: AC
  Filled 2012-02-01: qty 500

## 2012-02-01 MED ORDER — CEFAZOLIN SUBCONJUNCTIVAL INJECTION 100 MG/0.5 ML
200.0000 mg | INJECTION | Freq: Once | SUBCONJUNCTIVAL | Status: DC
  Filled 2012-02-01: qty 1

## 2012-02-01 MED ORDER — LIDOCAINE HCL 2 % IJ SOLN
INTRAMUSCULAR | Status: AC
  Filled 2012-02-01: qty 1

## 2012-02-01 MED ORDER — BUPIVACAINE HCL 0.75 % IJ SOLN
INTRAMUSCULAR | Status: DC | PRN
  Administered 2012-02-01: 20 mL

## 2012-02-01 MED ORDER — BUPIVACAINE HCL 0.75 % IJ SOLN
INTRAMUSCULAR | Status: AC
  Filled 2012-02-01: qty 10

## 2012-02-01 MED ORDER — BACITRACIN-POLYMYXIN B 500-10000 UNIT/GM OP OINT
TOPICAL_OINTMENT | OPHTHALMIC | Status: DC | PRN
  Administered 2012-02-01: 1 via OPHTHALMIC

## 2012-02-01 MED ORDER — DEXAMETHASONE SODIUM PHOSPHATE 10 MG/ML IJ SOLN
INTRAMUSCULAR | Status: AC
  Filled 2012-02-01: qty 1

## 2012-02-01 MED ORDER — MUPIROCIN 2 % EX OINT
TOPICAL_OINTMENT | Freq: Two times a day (BID) | CUTANEOUS | Status: DC
  Administered 2012-02-01: 1 via NASAL
  Filled 2012-02-01: qty 22

## 2012-02-01 MED ORDER — ONDANSETRON HCL 4 MG/2ML IJ SOLN
INTRAMUSCULAR | Status: DC | PRN
  Administered 2012-02-01: 4 mg via INTRAVENOUS

## 2012-02-01 MED ORDER — CEFAZOLIN SUBCONJUNCTIVAL INJECTION 100 MG/0.5 ML
INJECTION | SUBCONJUNCTIVAL | Status: DC | PRN
  Administered 2012-02-01: 200 mg via SUBCONJUNCTIVAL

## 2012-02-01 MED ORDER — HYDROMORPHONE HCL PF 1 MG/ML IJ SOLN: 0.2500 mg | INTRAMUSCULAR | Status: DC | PRN

## 2012-02-01 MED ORDER — SODIUM CHLORIDE 0.9 % IV SOLN
INTRAVENOUS | Status: DC | PRN
  Administered 2012-02-01: 15:00:00 via INTRAVENOUS

## 2012-02-01 MED ORDER — BSS IO SOLN
INTRAOCULAR | Status: AC
  Filled 2012-02-01: qty 15

## 2012-02-01 MED ORDER — HYPROMELLOSE (GONIOSCOPIC) 2.5 % OP SOLN
OPHTHALMIC | Status: AC
  Filled 2012-02-01: qty 15

## 2012-02-01 MED ORDER — ONDANSETRON HCL 4 MG/2ML IJ SOLN: 4.0000 mg | Freq: Once | INTRAMUSCULAR | Status: DC | PRN

## 2012-02-01 MED ORDER — SODIUM CHLORIDE 0.9 % IV SOLN: INTRAVENOUS | Status: DC

## 2012-02-01 MED ORDER — HYPROMELLOSE (GONIOSCOPIC) 2.5 % OP SOLN
OPHTHALMIC | Status: DC | PRN
  Administered 2012-02-01: 2 [drp] via OPHTHALMIC

## 2012-02-01 MED ORDER — SODIUM CHLORIDE 0.9 % IV SOLN
INTRAVENOUS | Status: DC
  Administered 2012-02-01: 14:00:00 via INTRAVENOUS

## 2012-02-01 MED ORDER — LIDOCAINE HCL 2 % IJ SOLN
INTRAMUSCULAR | Status: DC | PRN
  Administered 2012-02-01: 10 mL

## 2012-02-01 MED ORDER — BACITRACIN-POLYMYXIN B 500-10000 UNIT/GM OP OINT
TOPICAL_OINTMENT | OPHTHALMIC | Status: AC
  Filled 2012-02-01: qty 3.5

## 2012-02-01 MED ORDER — MIDAZOLAM HCL 5 MG/5ML IJ SOLN: INTRAMUSCULAR | Status: DC | PRN

## 2012-02-01 MED ORDER — MIDAZOLAM HCL 5 MG/5ML IJ SOLN
INTRAMUSCULAR | Status: DC | PRN
  Administered 2012-02-01: 2 mg via INTRAVENOUS

## 2012-02-01 MED ORDER — DEXAMETHASONE SODIUM PHOSPHATE 10 MG/ML IJ SOLN
INTRAMUSCULAR | Status: DC | PRN
  Administered 2012-02-01: 10 mg via INTRAVENOUS

## 2012-02-01 SURGICAL SUPPLY — 72 items
ACCESSORY FRAGMATOME (MISCELLANEOUS) IMPLANT
APPLICATOR COTTON TIP 6IN STRL (MISCELLANEOUS) ×2 IMPLANT
APPLICATOR DR MATTHEWS STRL (MISCELLANEOUS) IMPLANT
BAG MINI COLL DRAIN (WOUND CARE) ×2 IMPLANT
BLADE EYE MINI 60D BEAVER (BLADE) ×2 IMPLANT
BLADE KERATOME 2.75 (BLADE) ×2 IMPLANT
BLADE MVR KNIFE 19G (BLADE) ×2 IMPLANT
BLADE STAB KNIFE 15DEG (BLADE) ×2 IMPLANT
CANNULA DUAL BORE 23G (CANNULA) IMPLANT
CANNULA FLEX TIP 23G (CANNULA) ×2 IMPLANT
CLOTH BEACON ORANGE TIMEOUT ST (SAFETY) ×2 IMPLANT
CORDS BIPOLAR (ELECTRODE) IMPLANT
DRAPE OPHTHALMIC 77X100 STRL (CUSTOM PROCEDURE TRAY) ×2 IMPLANT
DRAPE POUCH INSTRU U-SHP 10X18 (DRAPES) ×2 IMPLANT
DRSG TEGADERM 4X4.75 (GAUZE/BANDAGES/DRESSINGS) ×2 IMPLANT
ERASER HMR WETFIELD 23G BP (MISCELLANEOUS) IMPLANT
FILTER BLUE MILLIPORE (MISCELLANEOUS) ×2 IMPLANT
GAS OPHTHALMIC (MISCELLANEOUS) IMPLANT
GLOVE SS BIOGEL STRL SZ 6.5 (GLOVE) ×2 IMPLANT
GLOVE SUPERSENSE BIOGEL SZ 6.5 (GLOVE) ×2
GOWN SRG XL XLNG 56XLVL 4 (GOWN DISPOSABLE) ×1 IMPLANT
GOWN STRL NON-REIN LRG LVL3 (GOWN DISPOSABLE) ×2 IMPLANT
GOWN STRL NON-REIN XL XLG LVL4 (GOWN DISPOSABLE) ×1
ILLUMINATOR WIDEFIELD DIFF (MISCELLANEOUS) ×2 IMPLANT
KIT BASIN OR (CUSTOM PROCEDURE TRAY) ×2 IMPLANT
KIT ROOM TURNOVER OR (KITS) ×2 IMPLANT
KNIFE CRESCENT 1.75 EDGEAHEAD (BLADE) ×2 IMPLANT
KNIFE GRIESHABER SHARP 2.5MM (MISCELLANEOUS) ×2 IMPLANT
MARKER SKIN DUAL TIP RULER LAB (MISCELLANEOUS) IMPLANT
MASK EYE SHIELD (GAUZE/BANDAGES/DRESSINGS) ×2 IMPLANT
NEEDLE 18GX1X1/2 (RX/OR ONLY) (NEEDLE) ×2 IMPLANT
NEEDLE 22X1 1/2 (OR ONLY) (NEEDLE) IMPLANT
NEEDLE 25GX 5/8IN NON SAFETY (NEEDLE) ×2 IMPLANT
NEEDLE FILTER BLUNT 18X 1/2SAF (NEEDLE) ×1
NEEDLE FILTER BLUNT 18X1 1/2 (NEEDLE) ×1 IMPLANT
NEEDLE HYPO 30X.5 LL (NEEDLE) ×4 IMPLANT
NS IRRIG 1000ML POUR BTL (IV SOLUTION) ×2 IMPLANT
PACK COMBINED CATERACT/VIT 23G (OPHTHALMIC RELATED) IMPLANT
PACK VITRECTOMY CUSTOM (CUSTOM PROCEDURE TRAY) ×2 IMPLANT
PACK VITRECTOMY PIC MCHSVP (PACKS) IMPLANT
PAD ARMBOARD 7.5X6 YLW CONV (MISCELLANEOUS) ×2 IMPLANT
PAD EYE OVAL STERILE LF (GAUZE/BANDAGES/DRESSINGS) ×2 IMPLANT
PAK VITRECTOMY PIK  23GA (OPHTHALMIC RELATED) ×2 IMPLANT
PENCIL BIPOLAR 25GA STR DISP (OPHTHALMIC RELATED) IMPLANT
PHACO TIP KELMAN 45DEG (TIP) IMPLANT
PROBE DIRECTIONAL LASER (MISCELLANEOUS) ×2 IMPLANT
ROLLS DENTAL (MISCELLANEOUS) ×4 IMPLANT
SCRAPER DIAMOND DUST MEMBRANE (MISCELLANEOUS) ×2 IMPLANT
SET FLUID INJECTOR (SET/KITS/TRAYS/PACK) IMPLANT
SET VGFI TUBING 8065808002 (SET/KITS/TRAYS/PACK) IMPLANT
SHUTTLE MONARCH TYPE A (NEEDLE) IMPLANT
SOLUTION ANTI FOG 6CC (MISCELLANEOUS) ×2 IMPLANT
SPEAR EYE SURG WECK-CEL (MISCELLANEOUS) ×6 IMPLANT
SUT ETHILON 10 0 CS140 6 (SUTURE) IMPLANT
SUT ETHILON 4 0 P 3 18 (SUTURE) IMPLANT
SUT ETHILON 5 0 P 3 18 (SUTURE)
SUT ETHILON 9 0 TG140 8 (SUTURE) IMPLANT
SUT NYLON 10.0 BLK (SUTURE) IMPLANT
SUT NYLON ETHILON 5-0 P-3 1X18 (SUTURE) IMPLANT
SUT PLAIN 6 0 TG1408 (SUTURE) IMPLANT
SUT POLY NON ABSORB 10-0 8 STR (SUTURE) IMPLANT
SUT VICRYL 7 0 TG140 8 (SUTURE) IMPLANT
SUT VICRYL ABS 6-0 S29 18IN (SUTURE) IMPLANT
SYR 20CC LL (SYRINGE) ×2 IMPLANT
SYR 5ML LL (SYRINGE) IMPLANT
SYR TB 1ML LUER SLIP (SYRINGE) ×2 IMPLANT
SYRINGE 10CC LL (SYRINGE) IMPLANT
TAPE CLOTH 1X10 TAN NS (GAUZE/BANDAGES/DRESSINGS) ×2 IMPLANT
TOWEL OR 17X24 6PK STRL BLUE (TOWEL DISPOSABLE) ×6 IMPLANT
TUBE EXTENSION HAMMER (TUBING) IMPLANT
WATER STERILE IRR 1000ML POUR (IV SOLUTION) ×2 IMPLANT
WIPE INSTRUMENT VISIWIPE 73X73 (MISCELLANEOUS) ×2 IMPLANT

## 2012-02-01 NOTE — H&P (Signed)
  Pre-operative History and Physical for Ophthalmic Surgery  Cesar Ali 02/01/2012                  Chief Complaint: Decreased vision left eye  Diagnosis: Proliferative Diabetic Retinopathy, Vitreous Hemorrhage Left eye  No Known Allergies   Prior to Admission medications   Medication Sig Start Date End Date Taking? Authorizing Provider  amLODipine (NORVASC) 10 MG tablet Take 10 mg by mouth daily.   Yes Historical Provider, MD  atorvastatin (LIPITOR) 10 MG tablet Take 10 mg by mouth daily.   Yes Historical Provider, MD  clopidogrel (PLAVIX) 75 MG tablet Take 75 mg by mouth daily.   Yes Historical Provider, MD  furosemide (LASIX) 40 MG tablet Take 40 mg by mouth daily.   Yes Historical Provider, MD  insulin NPH (HUMULIN N,NOVOLIN N) 100 UNIT/ML injection Inject 20-25 Units into the skin 2 (two) times daily. Inject 20 units in the morning, and 25 units in the evening.   Yes Historical Provider, MD  insulin regular (NOVOLIN R,HUMULIN R) 100 units/mL injection Inject 2-8 Units into the skin as needed. Per sliding scale. 200-250= 2 units, 251-300= 4 units, 301-350= 6 units, 351-400= 8 units.   Yes Historical Provider, MD  lisinopril (PRINIVIL,ZESTRIL) 10 MG tablet Take 10 mg by mouth daily.   Yes Historical Provider, MD    Planned Procedure: Pars Plana Vitrectomy, Membrane Peeling and Endolaser Left Eye                          Filed Vitals:   02/01/12 1215  BP: 130/87  Pulse: 79  Temp: 97.8 F (36.6 C)  Resp: 18    Past Medical History  Diagnosis Date  . Stroke   . High cholesterol   . Diabetes mellitus   . Hypertension     [facility]    Past Surgical History  Procedure Date  . Eye surgery   . Multiple tooth extractions      History   Social History  . Marital Status: Single    Spouse Name: N/A    Number of Children: N/A  . Years of Education: N/A   Occupational History  . Not on file.   Social History Main Topics  . Smoking status: Former  Games developer  . Smokeless tobacco: Not on file  . Alcohol Use: No  . Drug Use: No  . Sexually Active:    Other Topics Concern  . Not on file   Social History Narrative  . No narrative on file     The following examination is for anesthesia clearance for minimally invasive Ophthalmic surgery. It is primarily to document heart and lung findings and is not intended to elucidate unknown general medical conditions inclusive of abdominal masses, lung lesions, etc.   General Constitution:  within normal limits   Alertness/Orientation:  Person, time place     yes   HEENT:  Eye Findings: Vitreous Hemorrhage OS, Proliferative Diabetic Retinopathy OS                   left eye  Neck: supple without masses  Chest/Lungs: clear to auscultation  Cardiac: Normal S1 and S2 without Murmur, S3 or S4  Neuro: non-focal  Impression: Proliferative Diabetic Retinopathy,  Vitreous Hemorrhage OS   Planned Procedure:  Pars Plana Vitrectomy, Membrane Peeling, Yancey Flemings, MD

## 2012-02-01 NOTE — Transfer of Care (Signed)
Immediate Anesthesia Transfer of Care Note  Patient: Cesar Ali  Procedure(s) Performed: Procedure(s) (LRB): PARS PLANA VITRECTOMY WITH 23 GAUGE (Left)  Patient Location: PACU  Anesthesia Type: General  Level of Consciousness: awake, alert , oriented and patient cooperative  Airway & Oxygen Therapy: Patient Spontanous Breathing and Patient connected to nasal cannula oxygen  Post-op Assessment: Report given to PACU RN, Post -op Vital signs reviewed and stable and Patient moving all extremities X 4  Post vital signs: Reviewed and stable  Complications: No apparent anesthesia complications

## 2012-02-01 NOTE — Op Note (Signed)
Cesar Ali 02/01/2012 Proliferative Diabetic Retinopathy,  Vitreous Hemorrhage  Procedure: Pars Plana Vitrectomy, Membrane Peeling, Endolaser and Fluid Gas Exchange Operative Eye:  left eye  Surgeon: Shade Flood Estimated Blood Loss: minimal Specimens for Pathology:  None Complications: none   The  patient was prepped and draped in the usual fashion for ocular surgery on the  left eye .  A solid lid speculum was placed. The conjunctiva was displaced with a cotton tipped applicator at the  4:30  meridian. A trocar/cannula was placed 3.5 mm from the surgical limbus. The cannula was visualized in the vitreous cavity. The infusion line was allowed to run and then clamped when placed at the cannula opening. The line was inserted and secured to the drape with an adhesive strip. Trocar/Cannulas were then placed at the 9:30 and 2:30 meridian. The light pipe and vitreous cutter were inserted into the vitreous cavity and the wide field lens was placed. The patient had multiple areas of NVE with multiple, white occluded vessels. He had moderate vitreous hemorrhage. Core vitrectomy was carried out using the vitreous cutter to transect fibrovascular bands. The vitreous cutter was then used to delaminate fibrovascular tissue from the retina in all quadrants. Total air fluid exchange was then carried out to help minimize post op bleeding and to provide tamponade againstt any small retinal breaks that might occur in his friable retina after membrane peeling.  After completion of PRP, 20 SF6 gas was passed through the infusion cannula to insure uniform concentration.  The infusion cannula was removed along with the other cannulas. Final IOP was less than allowing for expansion.   Subconjunctival injections of Ancef 100mg /0.61ml and Dexamethasone 4mg /3ml were placed in the infero-medial quadrant to avoid proximity to the cannula sites.   The speculum and drapes were removed and the eye was patched with  Polymixin/Bacitracin ophthalmic ointment. An eye shield was placed and the patient was transferred  with stable vital signs to the post operative recovery area.  Shade Flood MD

## 2012-02-01 NOTE — Anesthesia Preprocedure Evaluation (Addendum)
Anesthesia Evaluation  Patient identified by MRN, date of birth, ID band Patient awake    Reviewed: Allergy & Precautions, H&P , NPO status , Patient's Chart, lab work & pertinent test results  Airway Mallampati: I TM Distance: >3 FB Neck ROM: Full    Dental  (+) Edentulous Upper and Lower Dentures   Pulmonary          Cardiovascular hypertension, Pt. on medications     Neuro/Psych CVA, Residual Symptoms    GI/Hepatic   Endo/Other  Insulin Dependent  Renal/GU Renal disease     Musculoskeletal   Abdominal   Peds  Hematology   Anesthesia Other Findings   Reproductive/Obstetrics                           Anesthesia Physical Anesthesia Plan  ASA: III  Anesthesia Plan: General   Post-op Pain Management:    Induction: Intravenous  Airway Management Planned: LMA  Additional Equipment:   Intra-op Plan:   Post-operative Plan: Extubation in OR  Informed Consent: I have reviewed the patients History and Physical, chart, labs and discussed the procedure including the risks, benefits and alternatives for the proposed anesthesia with the patient or authorized representative who has indicated his/her understanding and acceptance.     Plan Discussed with: CRNA and Surgeon  Anesthesia Plan Comments:         Anesthesia Quick Evaluation

## 2012-02-02 ENCOUNTER — Encounter (HOSPITAL_COMMUNITY): Payer: Self-pay | Admitting: Ophthalmology

## 2012-02-02 NOTE — Anesthesia Postprocedure Evaluation (Signed)
Anesthesia Post Note  Patient: Cesar Ali  Procedure(s) Performed: Procedure(s) (LRB): PARS PLANA VITRECTOMY WITH 23 GAUGE (Left)  Anesthesia type: general  Patient location: PACU  Post pain: Pain level controlled  Post assessment: Patient's Cardiovascular Status Stable  Last Vitals:  Filed Vitals:   02/01/12 1845  BP: 132/90  Pulse:   Temp: 36.7 C  Resp:     Post vital signs: Reviewed and stable  Level of consciousness: sedated  Complications: No apparent anesthesia complications

## 2012-02-07 ENCOUNTER — Encounter (HOSPITAL_COMMUNITY): Payer: Self-pay

## 2012-02-08 ENCOUNTER — Ambulatory Visit: Payer: Medicaid Other | Admitting: Occupational Therapy

## 2012-02-08 ENCOUNTER — Encounter: Payer: Medicaid Other | Admitting: Occupational Therapy

## 2012-02-15 ENCOUNTER — Encounter: Payer: Medicaid Other | Admitting: Occupational Therapy

## 2012-02-21 ENCOUNTER — Encounter: Payer: Medicaid Other | Admitting: Occupational Therapy

## 2012-03-01 ENCOUNTER — Ambulatory Visit: Payer: Medicaid Other | Attending: Internal Medicine | Admitting: Occupational Therapy

## 2012-03-01 DIAGNOSIS — IMO0001 Reserved for inherently not codable concepts without codable children: Secondary | ICD-10-CM | POA: Insufficient documentation

## 2012-03-01 DIAGNOSIS — M6281 Muscle weakness (generalized): Secondary | ICD-10-CM | POA: Insufficient documentation

## 2012-03-01 DIAGNOSIS — R279 Unspecified lack of coordination: Secondary | ICD-10-CM | POA: Insufficient documentation

## 2012-03-01 DIAGNOSIS — I69998 Other sequelae following unspecified cerebrovascular disease: Secondary | ICD-10-CM | POA: Insufficient documentation

## 2012-03-06 ENCOUNTER — Ambulatory Visit: Payer: Medicaid Other | Admitting: Occupational Therapy

## 2012-03-13 ENCOUNTER — Ambulatory Visit: Payer: Medicaid Other | Attending: Internal Medicine | Admitting: Occupational Therapy

## 2012-03-13 DIAGNOSIS — I69998 Other sequelae following unspecified cerebrovascular disease: Secondary | ICD-10-CM | POA: Insufficient documentation

## 2012-03-13 DIAGNOSIS — IMO0001 Reserved for inherently not codable concepts without codable children: Secondary | ICD-10-CM | POA: Insufficient documentation

## 2012-03-13 DIAGNOSIS — M6281 Muscle weakness (generalized): Secondary | ICD-10-CM | POA: Insufficient documentation

## 2012-03-13 DIAGNOSIS — R279 Unspecified lack of coordination: Secondary | ICD-10-CM | POA: Insufficient documentation

## 2012-03-20 ENCOUNTER — Ambulatory Visit: Payer: Medicaid Other | Admitting: Occupational Therapy

## 2012-09-24 ENCOUNTER — Other Ambulatory Visit (HOSPITAL_COMMUNITY): Payer: Self-pay | Admitting: *Deleted

## 2012-09-25 ENCOUNTER — Encounter (HOSPITAL_COMMUNITY)
Admission: RE | Admit: 2012-09-25 | Discharge: 2012-09-25 | Disposition: A | Discharge status: Home / Self Care | Payer: Medicaid Other | Source: Ambulatory Visit | Attending: Nephrology | Admitting: Nephrology

## 2012-09-25 VITALS — BP 110/77 | HR 75

## 2012-09-25 DIAGNOSIS — N189 Chronic kidney disease, unspecified: Secondary | ICD-10-CM

## 2012-09-25 DIAGNOSIS — D638 Anemia in other chronic diseases classified elsewhere: Secondary | ICD-10-CM | POA: Insufficient documentation

## 2012-09-25 DIAGNOSIS — N183 Chronic kidney disease, stage 3 unspecified: Secondary | ICD-10-CM | POA: Insufficient documentation

## 2012-09-25 MED ORDER — EPOETIN ALFA 10000 UNIT/ML IJ SOLN
5000.0000 [IU] | INTRAMUSCULAR | Status: DC
  Administered 2012-09-25: 5000 [IU] via SUBCUTANEOUS

## 2012-09-25 MED ORDER — EPOETIN ALFA 10000 UNIT/ML IJ SOLN
INTRAMUSCULAR | Status: AC
  Filled 2012-09-25: qty 1

## 2012-10-02 ENCOUNTER — Encounter (HOSPITAL_COMMUNITY)
Admission: RE | Admit: 2012-10-02 | Discharge: 2012-10-02 | Disposition: A | Discharge status: Home / Self Care | Payer: Medicaid Other | Source: Ambulatory Visit | Attending: Nephrology | Admitting: Nephrology

## 2012-10-02 VITALS — BP 122/75 | HR 87 | Temp 98.4°F | Resp 18

## 2012-10-02 DIAGNOSIS — N189 Chronic kidney disease, unspecified: Secondary | ICD-10-CM

## 2012-10-02 MED ORDER — EPOETIN ALFA 10000 UNIT/ML IJ SOLN
5000.0000 [IU] | INTRAMUSCULAR | Status: DC
  Administered 2012-10-02: 5000 [IU] via SUBCUTANEOUS

## 2012-10-02 MED ORDER — EPOETIN ALFA 10000 UNIT/ML IJ SOLN
INTRAMUSCULAR | Status: AC
  Filled 2012-10-02: qty 1

## 2012-10-09 ENCOUNTER — Encounter (HOSPITAL_COMMUNITY)
Admission: RE | Admit: 2012-10-09 | Discharge: 2012-10-09 | Disposition: A | Discharge status: Home / Self Care | Payer: Medicaid Other | Source: Ambulatory Visit | Attending: Nephrology | Admitting: Nephrology

## 2012-10-09 VITALS — BP 130/85 | HR 63 | Temp 98.7°F | Resp 18

## 2012-10-09 DIAGNOSIS — N189 Chronic kidney disease, unspecified: Secondary | ICD-10-CM

## 2012-10-09 LAB — POCT HEMOGLOBIN-HEMACUE: Hemoglobin: 10 g/dL — ABNORMAL LOW (ref 13.0–17.0)

## 2012-10-09 MED ORDER — EPOETIN ALFA 10000 UNIT/ML IJ SOLN
5000.0000 [IU] | INTRAMUSCULAR | Status: DC
  Administered 2012-10-09: 5000 [IU] via SUBCUTANEOUS

## 2012-10-09 MED ORDER — EPOETIN ALFA 10000 UNIT/ML IJ SOLN
INTRAMUSCULAR | Status: AC
  Filled 2012-10-09: qty 1

## 2012-10-16 ENCOUNTER — Encounter (HOSPITAL_COMMUNITY)
Admission: RE | Admit: 2012-10-16 | Discharge: 2012-10-16 | Disposition: A | Discharge status: Home / Self Care | Payer: Medicaid Other | Source: Ambulatory Visit | Attending: Nephrology | Admitting: Nephrology

## 2012-10-16 VITALS — BP 142/84 | HR 88 | Temp 98.4°F | Resp 18

## 2012-10-16 DIAGNOSIS — N189 Chronic kidney disease, unspecified: Secondary | ICD-10-CM | POA: Insufficient documentation

## 2012-10-16 MED ORDER — EPOETIN ALFA 10000 UNIT/ML IJ SOLN
INTRAMUSCULAR | Status: AC
  Filled 2012-10-16: qty 1

## 2012-10-16 MED ORDER — EPOETIN ALFA 10000 UNIT/ML IJ SOLN
5000.0000 [IU] | INTRAMUSCULAR | Status: DC
  Administered 2012-10-16: 5000 [IU] via SUBCUTANEOUS

## 2013-01-21 ENCOUNTER — Other Ambulatory Visit (HOSPITAL_COMMUNITY): Payer: Self-pay | Admitting: *Deleted

## 2013-01-22 ENCOUNTER — Encounter (HOSPITAL_COMMUNITY): Payer: Medicaid Other

## 2013-01-24 ENCOUNTER — Encounter (HOSPITAL_COMMUNITY)
Admission: RE | Admit: 2013-01-24 | Discharge: 2013-01-24 | Disposition: A | Discharge status: Home / Self Care | Payer: Medicaid Other | Source: Ambulatory Visit | Attending: Nephrology | Admitting: Nephrology

## 2013-01-24 VITALS — BP 133/88 | HR 84 | Temp 98.0°F | Resp 18

## 2013-01-24 DIAGNOSIS — N183 Chronic kidney disease, stage 3 unspecified: Secondary | ICD-10-CM | POA: Insufficient documentation

## 2013-01-24 DIAGNOSIS — N189 Chronic kidney disease, unspecified: Secondary | ICD-10-CM

## 2013-01-24 DIAGNOSIS — D638 Anemia in other chronic diseases classified elsewhere: Secondary | ICD-10-CM | POA: Insufficient documentation

## 2013-01-24 LAB — IRON AND TIBC
Saturation Ratios: 22 % (ref 20–55)
TIBC: 305 ug/dL (ref 215–435)
UIBC: 237 ug/dL (ref 125–400)

## 2013-01-24 LAB — POCT HEMOGLOBIN-HEMACUE: Hemoglobin: 13.8 g/dL (ref 13.0–17.0)

## 2013-01-24 LAB — FERRITIN: Ferritin: 186 ng/mL (ref 22–322)

## 2013-01-24 MED ORDER — EPOETIN ALFA 10000 UNIT/ML IJ SOLN: 5000.0000 [IU] | INTRAMUSCULAR | Status: DC

## 2013-02-07 ENCOUNTER — Encounter (HOSPITAL_COMMUNITY)
Admission: RE | Admit: 2013-02-07 | Discharge: 2013-02-07 | Disposition: A | Discharge status: Home / Self Care | Payer: Medicaid Other | Source: Ambulatory Visit | Attending: Nephrology | Admitting: Nephrology

## 2013-02-07 VITALS — BP 153/96 | HR 81 | Temp 98.0°F | Resp 18

## 2013-02-07 DIAGNOSIS — N189 Chronic kidney disease, unspecified: Secondary | ICD-10-CM

## 2013-02-07 LAB — POCT HEMOGLOBIN-HEMACUE: Hemoglobin: 12.4 g/dL — ABNORMAL LOW (ref 13.0–17.0)

## 2013-02-07 MED ORDER — EPOETIN ALFA 10000 UNIT/ML IJ SOLN
5000.0000 [IU] | INTRAMUSCULAR | Status: DC
Start: 2013-02-07 — End: 2013-02-08

## 2013-02-21 ENCOUNTER — Encounter (HOSPITAL_COMMUNITY)
Admission: RE | Admit: 2013-02-21 | Discharge: 2013-02-21 | Disposition: A | Discharge status: Home / Self Care | Payer: Medicaid Other | Source: Ambulatory Visit | Attending: Nephrology | Admitting: Nephrology

## 2013-02-21 VITALS — BP 131/83 | HR 86 | Temp 98.3°F | Resp 18

## 2013-02-21 DIAGNOSIS — N189 Chronic kidney disease, unspecified: Secondary | ICD-10-CM

## 2013-02-21 LAB — IRON AND TIBC
Iron: 81 ug/dL (ref 42–135)
TIBC: 278 ug/dL (ref 215–435)
UIBC: 197 ug/dL (ref 125–400)

## 2013-02-21 LAB — FERRITIN: Ferritin: 101 ng/mL (ref 22–322)

## 2013-02-21 MED ORDER — EPOETIN ALFA 10000 UNIT/ML IJ SOLN: 5000.0000 [IU] | INTRAMUSCULAR | Status: DC

## 2013-03-07 ENCOUNTER — Encounter (HOSPITAL_COMMUNITY)
Admission: RE | Admit: 2013-03-07 | Discharge: 2013-03-07 | Disposition: A | Discharge status: Home / Self Care | Payer: Medicaid Other | Source: Ambulatory Visit | Attending: Nephrology | Admitting: Nephrology

## 2013-03-07 VITALS — BP 137/90 | HR 81 | Temp 98.4°F | Resp 18

## 2013-03-07 DIAGNOSIS — N189 Chronic kidney disease, unspecified: Secondary | ICD-10-CM

## 2013-03-07 MED ORDER — EPOETIN ALFA 10000 UNIT/ML IJ SOLN: 5000.0000 [IU] | INTRAMUSCULAR | Status: DC

## 2013-03-20 ENCOUNTER — Other Ambulatory Visit (HOSPITAL_COMMUNITY): Payer: Self-pay | Admitting: *Deleted

## 2013-03-21 ENCOUNTER — Encounter (HOSPITAL_COMMUNITY)
Admission: RE | Admit: 2013-03-21 | Discharge: 2013-03-21 | Disposition: A | Discharge status: Home / Self Care | Payer: Medicaid Other | Source: Ambulatory Visit | Attending: Nephrology | Admitting: Nephrology

## 2013-03-21 VITALS — BP 121/81 | HR 80 | Temp 98.1°F | Resp 20

## 2013-03-21 DIAGNOSIS — N189 Chronic kidney disease, unspecified: Secondary | ICD-10-CM | POA: Insufficient documentation

## 2013-03-21 LAB — FERRITIN: Ferritin: 87 ng/mL (ref 22–322)

## 2013-03-21 LAB — POCT HEMOGLOBIN-HEMACUE: Hemoglobin: 12.8 g/dL — ABNORMAL LOW (ref 13.0–17.0)

## 2013-03-21 LAB — IRON AND TIBC
Iron: 78 ug/dL (ref 42–135)
UIBC: 198 ug/dL (ref 125–400)

## 2013-03-21 MED ORDER — EPOETIN ALFA 10000 UNIT/ML IJ SOLN: 5000.0000 [IU] | INTRAMUSCULAR | Status: DC

## 2013-04-04 ENCOUNTER — Encounter (HOSPITAL_COMMUNITY)
Admission: RE | Admit: 2013-04-04 | Discharge: 2013-04-04 | Disposition: A | Discharge status: Home / Self Care | Payer: Medicaid Other | Source: Ambulatory Visit | Attending: Nephrology | Admitting: Nephrology

## 2013-04-04 VITALS — BP 131/80 | HR 88 | Temp 97.7°F

## 2013-04-04 DIAGNOSIS — N189 Chronic kidney disease, unspecified: Secondary | ICD-10-CM

## 2013-04-04 LAB — POCT HEMOGLOBIN-HEMACUE: Hemoglobin: 13.6 g/dL (ref 13.0–17.0)

## 2013-04-04 MED ORDER — EPOETIN ALFA 10000 UNIT/ML IJ SOLN: 5000.0000 [IU] | INTRAMUSCULAR | Status: DC

## 2013-04-18 ENCOUNTER — Encounter (HOSPITAL_COMMUNITY)
Admission: RE | Admit: 2013-04-18 | Discharge: 2013-04-18 | Disposition: A | Discharge status: Home / Self Care | Payer: Medicaid Other | Source: Ambulatory Visit | Attending: Nephrology | Admitting: Nephrology

## 2013-04-18 VITALS — BP 128/82 | HR 83 | Temp 98.0°F | Resp 18

## 2013-04-18 DIAGNOSIS — N189 Chronic kidney disease, unspecified: Secondary | ICD-10-CM | POA: Insufficient documentation

## 2013-04-18 LAB — IRON AND TIBC
Saturation Ratios: 35 % (ref 20–55)
TIBC: 281 ug/dL (ref 215–435)
UIBC: 184 ug/dL (ref 125–400)

## 2013-04-18 MED ORDER — EPOETIN ALFA 10000 UNIT/ML IJ SOLN: 5000.0000 [IU] | INTRAMUSCULAR | Status: DC

## 2013-04-19 LAB — POCT HEMOGLOBIN-HEMACUE: Hemoglobin: 12.9 g/dL — ABNORMAL LOW (ref 13.0–17.0)

## 2015-09-30 ENCOUNTER — Encounter (HOSPITAL_COMMUNITY): Payer: Self-pay | Admitting: Emergency Medicine

## 2015-09-30 DIAGNOSIS — Y998 Other external cause status: Secondary | ICD-10-CM | POA: Diagnosis not present

## 2015-09-30 DIAGNOSIS — E7800 Pure hypercholesterolemia, unspecified: Secondary | ICD-10-CM | POA: Insufficient documentation

## 2015-09-30 DIAGNOSIS — Y92009 Unspecified place in unspecified non-institutional (private) residence as the place of occurrence of the external cause: Secondary | ICD-10-CM | POA: Diagnosis not present

## 2015-09-30 DIAGNOSIS — Z7902 Long term (current) use of antithrombotics/antiplatelets: Secondary | ICD-10-CM | POA: Diagnosis not present

## 2015-09-30 DIAGNOSIS — Z794 Long term (current) use of insulin: Secondary | ICD-10-CM | POA: Diagnosis not present

## 2015-09-30 DIAGNOSIS — Y9389 Activity, other specified: Secondary | ICD-10-CM | POA: Insufficient documentation

## 2015-09-30 DIAGNOSIS — I10 Essential (primary) hypertension: Secondary | ICD-10-CM | POA: Insufficient documentation

## 2015-09-30 DIAGNOSIS — Z8673 Personal history of transient ischemic attack (TIA), and cerebral infarction without residual deficits: Secondary | ICD-10-CM | POA: Diagnosis not present

## 2015-09-30 DIAGNOSIS — Z79899 Other long term (current) drug therapy: Secondary | ICD-10-CM | POA: Insufficient documentation

## 2015-09-30 DIAGNOSIS — S29001A Unspecified injury of muscle and tendon of front wall of thorax, initial encounter: Secondary | ICD-10-CM | POA: Diagnosis present

## 2015-09-30 DIAGNOSIS — S2231XA Fracture of one rib, right side, initial encounter for closed fracture: Secondary | ICD-10-CM | POA: Diagnosis not present

## 2015-09-30 DIAGNOSIS — Z87891 Personal history of nicotine dependence: Secondary | ICD-10-CM | POA: Insufficient documentation

## 2015-09-30 DIAGNOSIS — E119 Type 2 diabetes mellitus without complications: Secondary | ICD-10-CM | POA: Diagnosis not present

## 2015-09-30 DIAGNOSIS — W01198A Fall on same level from slipping, tripping and stumbling with subsequent striking against other object, initial encounter: Secondary | ICD-10-CM | POA: Diagnosis not present

## 2015-09-30 NOTE — ED Notes (Signed)
Pt. slipped and fell last Saturday , denies LOC /ambulatory , reports right lateral ribcage pain worse with movement , palpation and deep inspiration. Respirations unlabored.

## 2015-10-01 ENCOUNTER — Emergency Department (HOSPITAL_COMMUNITY)
Admission: EM | Admit: 2015-10-01 | Discharge: 2015-10-01 | Disposition: A | Discharge status: Home / Self Care | Payer: Medicaid Other | Attending: Emergency Medicine | Admitting: Emergency Medicine

## 2015-10-01 ENCOUNTER — Emergency Department (HOSPITAL_COMMUNITY): Payer: Medicaid Other

## 2015-10-01 DIAGNOSIS — S2231XA Fracture of one rib, right side, initial encounter for closed fracture: Secondary | ICD-10-CM

## 2015-10-01 DIAGNOSIS — Y92009 Unspecified place in unspecified non-institutional (private) residence as the place of occurrence of the external cause: Secondary | ICD-10-CM

## 2015-10-01 DIAGNOSIS — W19XXXA Unspecified fall, initial encounter: Secondary | ICD-10-CM

## 2015-10-01 MED ORDER — OXYCODONE-ACETAMINOPHEN 5-325 MG PO TABS: 1.0000 | ORAL_TABLET | ORAL | Status: DC | PRN

## 2015-10-01 NOTE — ED Provider Notes (Signed)
CSN: [id number]     Arrival date & time 09/30/15  2340 History  By signing my name below, I, Cesar Ali, attest that this documentation has been prepared under the direction and in the presence of Dione Booze, MD. Electronically Signed: Bethel Ali, ED Scribe. 10/01/2015. 3:34 AM   Chief Complaint  Patient presents with  . Fall   The history is provided by the patient. No language interpreter was used.   Cesar Ali is a 50 y.o. male who presents to the Emergency Department complaining of a fall 5 days ago at home.  Pt states that he slipped and fell striking his back on the concrete.  Associated symptoms include 7-8/10 in severity right sided rib pain with movement and breathing. He took nothing for pain at home.  Pt denies head injury and LOC.   Past Medical History  Diagnosis Date  . Stroke (HCC)   . High cholesterol   . Diabetes mellitus   . Hypertension     [facility]   Past Surgical History  Procedure Laterality Date  . Eye surgery    . Multiple tooth extractions    . Pars plana vitrectomy  02/01/2012    Procedure: PARS PLANA VITRECTOMY WITH 23 GAUGE;  Surgeon: Shade Flood, MD;  Location: [facility] OR;  Service: Ophthalmology;  Laterality: Left;   No family history on file. Social History  Substance Use Topics  . Smoking status: Former Games developer  . Smokeless tobacco: None  . Alcohol Use: No    Review of Systems  Musculoskeletal:       Right sided rib pain  Skin: Negative for wound.  Neurological: Negative for syncope.  All other systems reviewed and are negative.  Allergies  Review of patient's allergies indicates no known allergies.  Home Medications   Prior to Admission medications   Medication Sig Start Date End Date Taking? Authorizing Provider  amLODipine (NORVASC) 10 MG tablet Take 10 mg by mouth daily.    Historical Provider, MD  atorvastatin (LIPITOR) 10 MG tablet Take 10 mg by mouth daily.    Historical Provider, MD  clopidogrel  (PLAVIX) 75 MG tablet Take 75 mg by mouth daily.    Historical Provider, MD  furosemide (LASIX) 40 MG tablet Take 40 mg by mouth daily.    Historical Provider, MD  insulin NPH (HUMULIN N,NOVOLIN N) 100 UNIT/ML injection Inject 20-25 Units into the skin 2 (two) times daily. Inject 20 units in the morning, and 25 units in the evening.    Historical Provider, MD  insulin regular (NOVOLIN R,HUMULIN R) 100 units/mL injection Inject 2-8 Units into the skin as needed. Per sliding scale. 200-250= 2 units, 251-300= 4 units, 301-350= 6 units, 351-400= 8 units.    Historical Provider, MD  lisinopril (PRINIVIL,ZESTRIL) 10 MG tablet Take 10 mg by mouth daily.    Historical Provider, MD   BP 135/83 mmHg  Pulse 72  Temp(Src) 98.2 F (36.8 C) (Oral)  Resp 20  Ht 6' (1.829 m)  Wt 185 lb 8 oz (84.142 kg)  BMI 25.15 kg/m2  SpO2 98% Physical Exam  Constitutional: He is oriented to person, place, and time. He appears well-developed and well-nourished.  HENT:  Head: Normocephalic and atraumatic.  Eyes: EOM are normal. Pupils are equal, round, and reactive to light.  Neck: Normal range of motion. Neck supple. No JVD present.  Cardiovascular: Normal rate, regular rhythm, normal heart sounds and intact distal pulses.   No murmur heard. Pulmonary/Chest: Effort  normal and breath sounds normal. He has no wheezes. He has no rales. He exhibits tenderness.  Marked tenderness at right lateral rib cage   Abdominal: Soft. Bowel sounds are normal. He exhibits no distension and no mass. There is no tenderness.  Musculoskeletal: Normal range of motion. He exhibits no edema.  Lymphadenopathy:    He has no cervical adenopathy.  Neurological: He is alert and oriented to person, place, and time. No cranial nerve deficit. He exhibits normal muscle tone. Coordination normal.  Skin: Skin is warm and dry. No rash noted.  Psychiatric: He has a normal mood and affect. His behavior is normal. Judgment and thought content normal.   Nursing note and vitals reviewed.   ED Course  Procedures (including critical care time) DIAGNOSTIC STUDIES: Oxygen Saturation is 98% on RA,  normal by my interpretation.    COORDINATION OF CARE: 3:32 AM Discussed treatment plan which includes XR of the right ribs with pt at bedside and pt agreed to plan.  Imaging Review Dg Ribs Unilateral W/chest Right  10/01/2015  CLINICAL DATA:  Larey SeatFell 5 days ago, RIGHT rib pain. EXAM: RIGHT RIBS AND CHEST - 3+ VIEW COMPARISON:  Chest radiograph March 16, 2011 FINDINGS: Acute nondisplaced RIGHT lateral ninth rib fracture. There is no evidence of pneumothorax or pleural effusion. RIGHT lung base strandy densities. Heart size and mediastinal contours are within normal limits. IMPRESSION: Acute nondisplaced RIGHT lateral ninth rib fracture. RIGHT lung base atelectasis. Electronically Signed   By: Awilda Metroourtnay  Bloomer M.D.   On: 10/01/2015 00:33   I have personally reviewed and evaluated these images as part of my medical decision-making.   MDM   Final diagnoses:  Fall at home, initial encounter  Closed fracture of rib, right, initial encounter    Fall with fracture of right ninth rib. He is discharged with prescription for oxycodone-acetaminophen, advised to use acetaminophen for less severe pain. I have reviewed his old records and he does have renal insufficiency, so NSAIDs are contraindicated.   I personally performed the services described in this documentation, which was scribed in my presence. The recorded information has been reviewed and is accurate.      Dione Boozeavid Jadrian Bulman, MD 10/01/15 [phone]

## 2015-10-01 NOTE — Discharge Instructions (Signed)
Take acetaminophen (Tyleonl) as needed for less severe pain.   Rib Fracture A rib fracture is a break or crack in one of the bones of the ribs. The ribs are a group of long, curved bones that wrap around your chest and attach to your spine. They protect your lungs and other organs in the chest cavity. A broken or cracked rib is often painful, but most do not cause other problems. Most rib fractures heal on their own over time. However, rib fractures can be more serious if multiple ribs are broken or if broken ribs move out of place and push against other structures. CAUSES   A direct blow to the chest. For example, this could happen during contact sports, a car accident, or a fall against a hard object.  Repetitive movements with high force, such as pitching a baseball or having severe coughing spells. SYMPTOMS   Pain when you breathe in or cough.  Pain when someone presses on the injured area. DIAGNOSIS  Your caregiver will perform a physical exam. Various imaging tests may be ordered to confirm the diagnosis and to look for related injuries. These tests may include a chest X-ray, computed tomography (CT), magnetic resonance imaging (MRI), or a bone scan. TREATMENT  Rib fractures usually heal on their own in 1-3 months. The longer healing period is often associated with a continued cough or other aggravating activities. During the healing period, pain control is very important. Medication is usually given to control pain. Hospitalization or surgery may be needed for more severe injuries, such as those in which multiple ribs are broken or the ribs have moved out of place.  HOME CARE INSTRUCTIONS   Avoid strenuous activity and any activities or movements that cause pain. Be careful during activities and avoid bumping the injured rib.  Gradually increase activity as directed by your caregiver.  Only take over-the-counter or prescription medications as directed by your caregiver. Do not take  other medications without asking your caregiver first.  Apply ice to the injured area for the first 1-2 days after you have been treated or as directed by your caregiver. Applying ice helps to reduce inflammation and pain.  Put ice in a plastic bag.  Place a towel between your skin and the bag.   Leave the ice on for 15-20 minutes at a time, every 2 hours while you are awake.  Perform deep breathing as directed by your caregiver. This will help prevent pneumonia, which is a common complication of a broken rib. Your caregiver may instruct you to:  Take deep breaths several times a day.  Try to cough several times a day, holding a pillow against the injured area.  Use a device called an incentive spirometer to practice deep breathing several times a day.  Drink enough fluids to keep your urine clear or pale yellow. This will help you avoid constipation.   Do not wear a rib belt or binder. These restrict breathing, which can lead to pneumonia.  SEEK IMMEDIATE MEDICAL CARE IF:   You have a fever.   You have difficulty breathing or shortness of breath.   You develop a continual cough, or you cough up thick or bloody sputum.  You feel sick to your stomach (nausea), throw up (vomit), or have abdominal pain.   You have worsening pain not controlled with medications.  MAKE SURE YOU:  Understand these instructions.  Will watch your condition.  Will get help right away if you are not doing well  or get worse.   This information is not intended to replace advice given to you by your health care provider. Make sure you discuss any questions you have with your health care provider.   Document Released: 05/30/2005 Document Revised: 01/30/2013 Document Reviewed: 08/01/2012 Elsevier Interactive Patient Education 2016 Elsevier [employer].  Acetaminophen; Oxycodone tablets What is this medicine? ACETAMINOPHEN; OXYCODONE (a set a MEE noe fen; ox i KOE done) is a pain reliever. It is used  to treat moderate to severe pain. This medicine may be used for other purposes; ask your health care provider or pharmacist if you have questions. What should I tell my health care provider before I take this medicine? They need to know if you have any of these conditions: -brain tumor -Crohn's disease, inflammatory bowel disease, or ulcerative colitis -drug abuse or addiction -head injury -heart or circulation problems -if you often drink alcohol -kidney disease or problems going to the bathroom -liver disease -lung disease, asthma, or breathing problems -an unusual or allergic reaction to acetaminophen, oxycodone, other opioid analgesics, other medicines, foods, dyes, or preservatives -pregnant or trying to get pregnant -breast-feeding How should I use this medicine? Take this medicine by mouth with a full glass of water. Follow the directions on the prescription label. You can take it with or without food. If it upsets your stomach, take it with food. Take your medicine at regular intervals. Do not take it more often than directed. Talk to your pediatrician regarding the use of this medicine in children. Special care may be needed. Patients over 33 years old may have a stronger reaction and need a smaller dose. Overdosage: If you think you have taken too much of this medicine contact a poison control center or emergency room at once. NOTE: This medicine is only for you. Do not share this medicine with others. What if I miss a dose? If you miss a dose, take it as soon as you can. If it is almost time for your next dose, take only that dose. Do not take double or extra doses. What may interact with this medicine? -alcohol -antihistamines -barbiturates like amobarbital, butalbital, butabarbital, methohexital, pentobarbital, phenobarbital, thiopental, and secobarbital -benztropine -drugs for bladder problems like solifenacin, trospium, oxybutynin, tolterodine, hyoscyamine, and  methscopolamine -drugs for breathing problems like ipratropium and tiotropium -drugs for certain stomach or intestine problems like propantheline, homatropine methylbromide, glycopyrrolate, atropine, belladonna, and dicyclomine -general anesthetics like etomidate, ketamine, nitrous oxide, propofol, desflurane, enflurane, halothane, isoflurane, and sevoflurane -medicines for depression, anxiety, or psychotic disturbances -medicines for sleep -muscle relaxants -naltrexone -narcotic medicines (opiates) for pain -phenothiazines like perphenazine, thioridazine, chlorpromazine, mesoridazine, fluphenazine, prochlorperazine, promazine, and trifluoperazine -scopolamine -tramadol -trihexyphenidyl This list may not describe all possible interactions. Give your health care provider a list of all the medicines, herbs, non-prescription drugs, or dietary supplements you use. Also tell them if you smoke, drink alcohol, or use illegal drugs. Some items may interact with your medicine. What should I watch for while using this medicine? Tell your doctor or health care professional if your pain does not go away, if it gets worse, or if you have new or a different type of pain. You may develop tolerance to the medicine. Tolerance means that you will need a higher dose of the medication for pain relief. Tolerance is normal and is expected if you take this medicine for a long time. Do not suddenly stop taking your medicine because you may develop a severe reaction. Your body becomes used to the medicine.  This does NOT mean you are addicted. Addiction is a behavior related to getting and using a drug for a non-medical reason. If you have pain, you have a medical reason to take pain medicine. Your doctor will tell you how much medicine to take. If your doctor wants you to stop the medicine, the dose will be slowly lowered over time to avoid any side effects. You may get drowsy or dizzy. Do not drive, use machinery, or do  anything that needs mental alertness until you know how this medicine affects you. Do not stand or sit up quickly, especially if you are an older patient. This reduces the risk of dizzy or fainting spells. Alcohol may interfere with the effect of this medicine. Avoid alcoholic drinks. There are different types of narcotic medicines (opiates) for pain. If you take more than one type at the same time, you may have more side effects. Give your health care provider a list of all medicines you use. Your doctor will tell you how much medicine to take. Do not take more medicine than directed. Call emergency for help if you have problems breathing. The medicine will cause constipation. Try to have a bowel movement at least every 2 to 3 days. If you do not have a bowel movement for 3 days, call your doctor or health care professional. Do not take Tylenol (acetaminophen) or medicines that have acetaminophen with this medicine. Too much acetaminophen can be very dangerous. Many nonprescription medicines contain acetaminophen. Always read the labels carefully to avoid taking more acetaminophen. What side effects may I notice from receiving this medicine? Side effects that you should report to your doctor or health care professional as soon as possible: -allergic reactions like skin rash, itching or hives, swelling of the face, lips, or tongue -breathing difficulties, wheezing -confusion -light headedness or fainting spells -severe stomach pain -unusually weak or tired -yellowing of the skin or the whites of the eyes Side effects that usually do not require medical attention (report to your doctor or health care professional if they continue or are bothersome): -dizziness -drowsiness -nausea -vomiting This list may not describe all possible side effects. Call your doctor for medical advice about side effects. You may report side effects to FDA at 1-800-FDA-1088. Where should I keep my medicine? Keep out of  the reach of children. This medicine can be abused. Keep your medicine in a safe place to protect it from theft. Do not share this medicine with anyone. Selling or giving away this medicine is dangerous and against the law. This medicine may cause accidental overdose and death if it taken by other adults, children, or pets. Mix any unused medicine with a substance like cat litter or coffee grounds. Then throw the medicine away in a sealed container like a sealed bag or a coffee can with a lid. Do not use the medicine after the expiration date. Store at room temperature between 20 and 25 degrees C (68 and 77 degrees F). NOTE: This sheet is a summary. It may not cover all possible information. If you have questions about this medicine, talk to your doctor, pharmacist, or health care provider.    2016, Elsevier/Gold Standard. (2014-04-30 15:18:46)

## 2018-03-26 ENCOUNTER — Other Ambulatory Visit: Payer: Self-pay

## 2018-03-26 ENCOUNTER — Emergency Department (HOSPITAL_COMMUNITY): Payer: Medicaid Other

## 2018-03-26 ENCOUNTER — Encounter (HOSPITAL_COMMUNITY): Payer: Self-pay | Admitting: Emergency Medicine

## 2018-03-26 ENCOUNTER — Emergency Department (HOSPITAL_COMMUNITY)
Admission: EM | Admit: 2018-03-26 | Discharge: 2018-03-27 | Disposition: A | Discharge status: Home / Self Care | Payer: Medicaid Other | Attending: Emergency Medicine | Admitting: Emergency Medicine

## 2018-03-26 DIAGNOSIS — S8252XA Displaced fracture of medial malleolus of left tibia, initial encounter for closed fracture: Secondary | ICD-10-CM | POA: Insufficient documentation

## 2018-03-26 DIAGNOSIS — Z794 Long term (current) use of insulin: Secondary | ICD-10-CM | POA: Diagnosis not present

## 2018-03-26 DIAGNOSIS — S82392A Other fracture of lower end of left tibia, initial encounter for closed fracture: Secondary | ICD-10-CM

## 2018-03-26 DIAGNOSIS — Z87891 Personal history of nicotine dependence: Secondary | ICD-10-CM | POA: Diagnosis not present

## 2018-03-26 DIAGNOSIS — S82832A Other fracture of upper and lower end of left fibula, initial encounter for closed fracture: Secondary | ICD-10-CM

## 2018-03-26 DIAGNOSIS — Y929 Unspecified place or not applicable: Secondary | ICD-10-CM | POA: Diagnosis not present

## 2018-03-26 DIAGNOSIS — I10 Essential (primary) hypertension: Secondary | ICD-10-CM | POA: Insufficient documentation

## 2018-03-26 DIAGNOSIS — X501XXA Overexertion from prolonged static or awkward postures, initial encounter: Secondary | ICD-10-CM | POA: Insufficient documentation

## 2018-03-26 DIAGNOSIS — Y939 Activity, unspecified: Secondary | ICD-10-CM | POA: Insufficient documentation

## 2018-03-26 DIAGNOSIS — E119 Type 2 diabetes mellitus without complications: Secondary | ICD-10-CM | POA: Diagnosis not present

## 2018-03-26 DIAGNOSIS — Y999 Unspecified external cause status: Secondary | ICD-10-CM | POA: Insufficient documentation

## 2018-03-26 NOTE — ED Notes (Signed)
Ice pack applied at triage 

## 2018-03-26 NOTE — ED Triage Notes (Signed)
Patient slipped and twisted his left ankle with swelling this evening .

## 2018-03-26 NOTE — ED Provider Notes (Signed)
[facility] EMERGENCY DEPARTMENT Provider Note   CSN: [id number] Arrival date & time: 03/26/18  1932     History   Chief Complaint Chief Complaint  Patient presents with  . Ankle Injury    HPI Cesar Ali is a 52 y.o. male with history of diabetes mellitus, hypertension, HLD, CVA currently on Plavix, no residual deficits presents for evaluation of acute onset, constant left ankle pain which began secondary to fall earlier this evening.  He states that at around 7 or 8 PM he slipped on some wet grass and everted his left ankle.  Denies head injury or loss of consciousness.  Denies any prodrome leading up to the fall including shortness of breath, chest pain or lightheadedness.  He states that his ankle was initially deformed but he was able to reduce the deformity himself.  He states that his ankle feels stiff and is throbbing, pain worsens with any attempts to ambulate but he has been able to bear weight.  Pain radiates down to the toes.  Denies numbness or weakness.  No medications prior to arrival.  The history is provided by the patient.    Past Medical History:  Diagnosis Date  . Diabetes mellitus   . High cholesterol   . Hypertension    [facility]. [facility]  . Stroke [facility])     Patient Active Problem List   Diagnosis Date Noted  . HYPERKALEMIA 07/28/2010  . ANEMIA 07/28/2010  . IDDM 07/14/2010  . HYPERLIPIDEMIA 07/14/2010  . HYPERTENSION 07/14/2010  . Chronic kidney disease 07/14/2010  . 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 EYE SURGERY    . MULTIPLE TOOTH EXTRACTIONS    . PARS PLANA VITRECTOMY  02/01/2012   Procedure: PARS PLANA VITRECTOMY WITH 23 GAUGE;  Surgeon: Shade Flood, MD;  Location: [facility] OR;  Service: Ophthalmology;  Laterality: Left;        Home Medications    Prior to Admission medications   Medication Sig Start Date End Date Taking? Authorizing Provider    amLODipine (NORVASC) 10 MG tablet Take 10 mg by mouth daily.    [provider]  atorvastatin (LIPITOR) 10 MG tablet Take 10 mg by mouth daily.    [provider]  clopidogrel (PLAVIX) 75 MG tablet Take 75 mg by mouth daily.    [provider]  furosemide (LASIX) 40 MG tablet Take 40 mg by mouth daily.    [provider]  HYDROcodone-acetaminophen (NORCO/VICODIN) 5-325 MG tablet Take 1 tablet by mouth every 6 (six) hours as needed for severe pain. 03/27/18   Glendal Cassaday A, PA-C  insulin NPH (HUMULIN N,NOVOLIN N) 100 UNIT/ML injection Inject 20-25 Units into the skin 2 (two) times daily. Inject 20 units in the morning, and 25 units in the evening.    [provider]  insulin regular (NOVOLIN R,HUMULIN R) 100 units/mL injection Inject 2-8 Units into the skin as needed. Per sliding scale. 200-250= 2 units, 251-300= 4 units, 301-350= 6 units, 351-400= 8 units.    [provider]  lisinopril (PRINIVIL,ZESTRIL) 10 MG tablet Take 10 mg by mouth daily.    [provider]  oxyCODONE-acetaminophen (PERCOCET) 5-325 MG tablet Take 1 tablet by mouth every 4 (four) hours as needed for moderate pain. 10/01/15   Dione Booze, MD    Family History No family history on file.  Social History Social History   Tobacco Use  .  Smoking status: Former Games developer  . Smokeless tobacco: Never Used  Substance Use Topics  . Alcohol use: No  . Drug use: No     Allergies   Patient has no known allergies.   Review of Systems Review of Systems  Constitutional: Negative for fever.  Respiratory: Negative for shortness of breath.   Cardiovascular: Negative for chest pain.  Musculoskeletal: Positive for arthralgias and joint swelling.  Neurological: Negative for syncope, weakness, light-headedness, numbness and headaches.     Physical Exam Updated Vital Signs BP 128/90 (BP Location: Left Arm)   Pulse 83   Temp 98.1 F (36.7 C) (Oral)   Resp 16    SpO2 100%   Physical Exam  Constitutional: He appears well-developed and well-nourished. No distress.  HENT:  Head: Normocephalic and atraumatic.  Eyes: Conjunctivae are normal. Right eye exhibits no discharge. Left eye exhibits no discharge.  Neck: No JVD present. No tracheal deviation present.  Cardiovascular: Normal rate and intact distal pulses.  Distal pulses faint, but DP/PT pulses dopplerable.  Extremities are warm equally  Pulmonary/Chest: Effort normal.  Abdominal: He exhibits no distension.  Musculoskeletal: He exhibits edema and tenderness.  Significant swelling around the left ankle with diffuse tenderness to palpation.  Limited range of motion of the ankle with flexion, extension, inversion, and eversion however 5/5 strength of BLE major muscle groups.  Neurological: He is alert.  Fluent speech, no facial droop, sensation intact to soft touch of bilateral lower extremities.  Ambulates with an antalgic gait  Skin: Skin is warm and dry. No erythema.  Psychiatric: He has a normal mood and affect. His behavior is normal.  Nursing note and vitals reviewed.    ED Treatments / Results  Labs (all labs ordered are listed, but only abnormal results are displayed) Labs Reviewed - No data to display  EKG None  Radiology Dg Ankle Complete Left  Result Date: 03/26/2018 CLINICAL DATA:  Slipped with ankle swelling EXAM: LEFT ANKLE COMPLETE - 3+ VIEW COMPARISON:  None. FINDINGS: Acute fracture involving the distal metaphysis of the fibula with extension of lucency to the superolateral joint space. About 1/3 bone with lateral displacement of distal fracture fragment. Small mildly displaced posterior malleolar fracture. Multiple suspected fracture fragments between the medial malleolus and the talus. There may be additional small fracture fragments superficial to the medial malleolus. There is marked soft tissue swelling. Vascular calcification. IMPRESSION: 1. Acute mildly displaced  distal fibular fracture with additional minimally displaced posterior malleolar fracture 2. Multiple suspected osseous/fracture fragments between the medial malleolus of the tibia and the medial aspect of the talus bone. Electronically Signed   By: Jasmine Pang M.D.   On: 03/26/2018 21:21    Procedures Procedures (including critical care time)  Medications Ordered in ED Medications - No data to display   Initial Impression / Assessment and Plan / ED Course  I have reviewed the triage vital signs and the nursing notes.  Pertinent labs & imaging results that were available during my care of the patient were reviewed by me and considered in my medical decision making (see chart for details).     Patient with left ankle pain secondary to mechanical fall earlier today.  He is afebrile, vital signs are stable.  He is nontoxic in appearance.  He is neurovascularly intact.  Compartments are soft. Patient X-Ray with mildly displaced distal fibular fracture with minimally displaced posterior malleolar fracture and multiple osseous fragments between the medial malleolus of the tibia and medial aspect of  the talus bone. Pt advised to follow up with orthopedics for further evaluation and treatment.  Pain managed in the department. Patient placed in short leg splint and given crutches while in ED, conservative therapy recommended and discussed.  He was given a small amount of hydrocodone for severe breakthrough pain.  [place] controlled substance registry was queried with no inconsistencies.  Discussed strict ED return precautions.  Patient and patient's neighbor verbalized understanding of and agreement with plan and patient is stable for discharge home at this time.  Patient seen and evaluated by Dr. Elesa Massed who agrees with assessment and plan at this time.   Final Clinical Impressions(s) / ED Diagnoses   Final diagnoses:  Closed fracture of posterior malleolus of left tibia, initial encounter    Other closed fracture of distal end of left fibula, initial encounter    ED Discharge Orders         Ordered    HYDROcodone-acetaminophen (NORCO/VICODIN) 5-325 MG tablet  Every 6 hours PRN     03/27/18 0008           Jeanie Sewer, PA-C 03/27/18 0133    Ward, Layla Maw, DO 03/27/18 0340

## 2018-03-27 MED ORDER — HYDROCODONE-ACETAMINOPHEN 5-325 MG PO TABS: 1.0000 | ORAL_TABLET | Freq: Four times a day (QID) | ORAL | 0 refills | Status: DC | PRN

## 2018-03-27 NOTE — ED Notes (Signed)
Paged ortho at this time.

## 2018-03-27 NOTE — Discharge Instructions (Addendum)
1. Medications: Take [phone] mg of Tylenol every 6 hours as needed for pain. Do not exceed 4000 mg of Tylenol daily.  You can also take ibuprofen occasionally for pain.  I usually recommend 400 mg every 6 hours.  Take ibuprofen with food to avoid upset stomach issues.  You can take hydrocodone as needed for severe pain but do not drive, drink alcohol, operate heavy machinery, or make important decisions while taking this medicine as it may make you drowsy.  You can also cut these tablets in half.  Be aware this medicine also contains Tylenol. 2. Treatment: rest, ice, elevate, drink plenty of fluids.  Wear the splint at all times and keep it clean and dry.  You can cover it with a bag when you are showering and tape over the bag so that it does not get wet. 3. Follow Up: Please followup with orthopedics within 1 to 2 weeks for reevaluation.; Please return to the ER for worsening symptoms or other concerns such as worsening swelling, redness of the skin, fevers, loss of pulses, or loss of feeling

## 2018-03-27 NOTE — ED Provider Notes (Signed)
Medical screening examination/treatment/procedure(s) were conducted as a shared visit with non-physician practitioner(s) and myself.  I personally evaluated the patient during the encounter.  None   Patient is a 52 year old male who presents to the emergency department with left ankle fracture.  X-ray shows acute mildly displaced distal fibular fracture with displaced posterior malleolar fracture.  Will place him in a short posterior splint with stirrups.  Keep him nonweightbearing and follow-up with orthopedics.  Patient had dopplerable pulse on examination.  Extremities warm.  No signs of compartment syndrome.  No tenderness over the proximal fibular head.  No other injury.   Adekunle Rohrbach, Layla Maw, DO 03/27/18 [phone]

## 2018-03-29 ENCOUNTER — Encounter (HOSPITAL_BASED_OUTPATIENT_CLINIC_OR_DEPARTMENT_OTHER): Payer: Self-pay | Admitting: *Deleted

## 2018-03-29 ENCOUNTER — Other Ambulatory Visit: Payer: Self-pay

## 2018-03-29 NOTE — Progress Notes (Addendum)
Spoke w/ pt via phone for pre-op interview.  Pt verbalized understanding npo after mn and arrive at 0700.  Needs istat 8 and ekg.  Will take only lipitor and norvasc am dos w/ sips of water.  Pt verbalized understanding to do half dose NPH insuling night before surgery and no insulin morning of surgery.  Requested lov note from nephrologist, dr webb.    ADDENDUM:  Received lov note and currently lab results dated 03-27-2018, placed with chart.  Pt will not need istat 8.  Hg and CMP done.

## 2018-03-30 NOTE — H&P (Signed)
[facility]  [address]   [address] [place] [address] [phone]   A Division of [facility]   RE: [place], [place]   [id number]      DOB: 05/03/1966  PROGRESS NOTE: 03-28-18  REASON FOR VISIT: Left ankle fracture suffered on 03-26-18.   HPI:   He slipped and suffered a mechanical fall. He has a trimalleolar equivalent ankle fracture with likely syndesmosis injury. The pain has been well controlled. No complaints at this time. He does have a history of a stroke several years ago. He is on Plavix and aspirin.   EXAMINATION: Well appearing male in no apparent distress. Neurovascularly intact. Swelling is well controlled.   IMAGES: X-rays reviewed by me:   X-rays on [place] demonstrate a trimalleolar equivalent ankle fracture with syndesmosis injury.   ASSESSMENT & PLAN: I recommend an open reduction and internal fixation of his trimalleolar ankle fracture,  primarily fixation of the lateral malleolus and syndesmosis open reduction and internal fixation. We will attempt to obtain clearance and see if we can hold his Plavix and aspirin. If not we will just continue that through surgery and use a tourniquet.   Jewel Baize.  Eulah Pont, M.D.  Electronically verified by Jewel Baize. Eulah Pont, M.D. TDMNorman Herrlich D:   03-29-18 T:   03-30-18

## 2018-04-03 ENCOUNTER — Other Ambulatory Visit: Payer: Self-pay

## 2018-04-03 ENCOUNTER — Encounter (HOSPITAL_BASED_OUTPATIENT_CLINIC_OR_DEPARTMENT_OTHER): Payer: Self-pay | Admitting: Anesthesiology

## 2018-04-03 ENCOUNTER — Ambulatory Visit (HOSPITAL_BASED_OUTPATIENT_CLINIC_OR_DEPARTMENT_OTHER): Payer: Medicaid Other | Admitting: Anesthesiology

## 2018-04-03 ENCOUNTER — Ambulatory Visit (HOSPITAL_BASED_OUTPATIENT_CLINIC_OR_DEPARTMENT_OTHER)
Admission: RE | Admit: 2018-04-03 | Discharge: 2018-04-03 | Disposition: A | Discharge status: Home / Self Care | Payer: Medicaid Other | Source: Ambulatory Visit | Attending: Orthopedic Surgery | Admitting: Orthopedic Surgery

## 2018-04-03 ENCOUNTER — Encounter (HOSPITAL_BASED_OUTPATIENT_CLINIC_OR_DEPARTMENT_OTHER)
Admission: RE | Disposition: A | Discharge status: Home / Self Care | Payer: Self-pay | Source: Ambulatory Visit | Attending: Orthopedic Surgery

## 2018-04-03 DIAGNOSIS — W010XXA Fall on same level from slipping, tripping and stumbling without subsequent striking against object, initial encounter: Secondary | ICD-10-CM | POA: Diagnosis not present

## 2018-04-03 DIAGNOSIS — S82892A Other fracture of left lower leg, initial encounter for closed fracture: Secondary | ICD-10-CM

## 2018-04-03 DIAGNOSIS — Z79899 Other long term (current) drug therapy: Secondary | ICD-10-CM | POA: Insufficient documentation

## 2018-04-03 DIAGNOSIS — S93431A Sprain of tibiofibular ligament of right ankle, initial encounter: Secondary | ICD-10-CM | POA: Insufficient documentation

## 2018-04-03 DIAGNOSIS — Z8673 Personal history of transient ischemic attack (TIA), and cerebral infarction without residual deficits: Secondary | ICD-10-CM | POA: Diagnosis not present

## 2018-04-03 DIAGNOSIS — Z794 Long term (current) use of insulin: Secondary | ICD-10-CM | POA: Diagnosis not present

## 2018-04-03 DIAGNOSIS — I10 Essential (primary) hypertension: Secondary | ICD-10-CM | POA: Diagnosis not present

## 2018-04-03 DIAGNOSIS — Z87891 Personal history of nicotine dependence: Secondary | ICD-10-CM | POA: Diagnosis not present

## 2018-04-03 DIAGNOSIS — Z7902 Long term (current) use of antithrombotics/antiplatelets: Secondary | ICD-10-CM | POA: Diagnosis not present

## 2018-04-03 DIAGNOSIS — S82852A Displaced trimalleolar fracture of left lower leg, initial encounter for closed fracture: Secondary | ICD-10-CM | POA: Diagnosis not present

## 2018-04-03 DIAGNOSIS — Z7982 Long term (current) use of aspirin: Secondary | ICD-10-CM | POA: Insufficient documentation

## 2018-04-03 DIAGNOSIS — E119 Type 2 diabetes mellitus without complications: Secondary | ICD-10-CM | POA: Insufficient documentation

## 2018-04-03 HISTORY — DX: Hyperlipidemia, unspecified: E78.5

## 2018-04-03 HISTORY — PX: ORIF ANKLE FRACTURE: SHX5408

## 2018-04-03 HISTORY — DX: Chronic kidney disease, stage 3 unspecified: N18.30

## 2018-04-03 HISTORY — DX: Presence of dental prosthetic device (complete) (partial): Z97.2

## 2018-04-03 HISTORY — DX: Unspecified sequelae of cerebral infarction: I69.30

## 2018-04-03 HISTORY — DX: Type 1 diabetes mellitus without complications: E10.9

## 2018-04-03 HISTORY — DX: Chronic kidney disease, stage 3 (moderate): N18.3

## 2018-04-03 HISTORY — DX: Long term (current) use of anticoagulants: Z79.01

## 2018-04-03 HISTORY — DX: Other fracture of left lower leg, initial encounter for closed fracture: S82.892A

## 2018-04-03 HISTORY — DX: Presence of dental prosthetic device (complete) (partial): K08.109

## 2018-04-03 LAB — GLUCOSE, CAPILLARY
GLUCOSE-CAPILLARY: 240 mg/dL — AB (ref 70–99)
GLUCOSE-CAPILLARY: 215 mg/dL — AB (ref 70–99)

## 2018-04-03 SURGERY — OPEN REDUCTION INTERNAL FIXATION (ORIF) ANKLE FRACTURE
Anesthesia: General | Site: Ankle | Laterality: Left

## 2018-04-03 MED ORDER — ACETAMINOPHEN 500 MG PO TABS
ORAL_TABLET | ORAL | Status: AC
  Filled 2018-04-03: qty 2

## 2018-04-03 MED ORDER — SODIUM CHLORIDE 0.9 % IV SOLN
INTRAVENOUS | Status: DC
  Administered 2018-04-03 (×2): via INTRAVENOUS
  Filled 2018-04-03: qty 1000

## 2018-04-03 MED ORDER — ACETAMINOPHEN 500 MG PO TABS
1000.0000 mg | ORAL_TABLET | Freq: Once | ORAL | Status: AC
  Administered 2018-04-03: 1000 mg via ORAL
  Filled 2018-04-03: qty 2

## 2018-04-03 MED ORDER — ROPIVACAINE HCL 5 MG/ML IJ SOLN
INTRAMUSCULAR | Status: DC | PRN
  Administered 2018-04-03: 30 mL via PERINEURAL

## 2018-04-03 MED ORDER — OXYCODONE-ACETAMINOPHEN 5-325 MG PO TABS: 1.0000 | ORAL_TABLET | ORAL | 0 refills | Status: AC | PRN

## 2018-04-03 MED ORDER — OXYCODONE HCL 5 MG PO TABS
5.0000 mg | ORAL_TABLET | Freq: Once | ORAL | Status: DC | PRN
  Filled 2018-04-03: qty 1

## 2018-04-03 MED ORDER — FENTANYL CITRATE (PF) 100 MCG/2ML IJ SOLN
INTRAMUSCULAR | Status: AC
  Filled 2018-04-03: qty 2

## 2018-04-03 MED ORDER — CEFAZOLIN SODIUM-DEXTROSE 2-4 GM/100ML-% IV SOLN
2.0000 g | INTRAVENOUS | Status: AC
  Administered 2018-04-03: 2 g via INTRAVENOUS
  Filled 2018-04-03: qty 100

## 2018-04-03 MED ORDER — FENTANYL CITRATE (PF) 100 MCG/2ML IJ SOLN
INTRAMUSCULAR | Status: DC | PRN
  Administered 2018-04-03 (×4): 25 ug via INTRAVENOUS

## 2018-04-03 MED ORDER — LIDOCAINE 2% (20 MG/ML) 5 ML SYRINGE
INTRAMUSCULAR | Status: AC
  Filled 2018-04-03: qty 5

## 2018-04-03 MED ORDER — ONDANSETRON HCL 4 MG/2ML IJ SOLN
INTRAMUSCULAR | Status: DC | PRN
  Administered 2018-04-03: 4 mg via INTRAVENOUS

## 2018-04-03 MED ORDER — DOCUSATE SODIUM 100 MG PO CAPS: 100.0000 mg | ORAL_CAPSULE | Freq: Two times a day (BID) | ORAL | 0 refills | Status: DC

## 2018-04-03 MED ORDER — PROPOFOL 10 MG/ML IV BOLUS
INTRAVENOUS | Status: DC | PRN
  Administered 2018-04-03: 40 mg via INTRAVENOUS
  Administered 2018-04-03: 160 mg via INTRAVENOUS

## 2018-04-03 MED ORDER — KETOROLAC TROMETHAMINE 30 MG/ML IJ SOLN
INTRAMUSCULAR | Status: AC
  Filled 2018-04-03: qty 1

## 2018-04-03 MED ORDER — MIDAZOLAM HCL 2 MG/2ML IJ SOLN
INTRAMUSCULAR | Status: AC
  Filled 2018-04-03: qty 2

## 2018-04-03 MED ORDER — OXYCODONE HCL 5 MG/5ML PO SOLN
5.0000 mg | Freq: Once | ORAL | Status: DC | PRN
  Filled 2018-04-03: qty 5

## 2018-04-03 MED ORDER — DEXAMETHASONE SODIUM PHOSPHATE 4 MG/ML IJ SOLN
INTRAMUSCULAR | Status: DC | PRN
  Administered 2018-04-03: 5 mg via INTRAVENOUS

## 2018-04-03 MED ORDER — ONDANSETRON HCL 4 MG/2ML IJ SOLN
INTRAMUSCULAR | Status: AC
  Filled 2018-04-03: qty 2

## 2018-04-03 MED ORDER — METHOCARBAMOL 500 MG PO TABS: 500.0000 mg | ORAL_TABLET | Freq: Three times a day (TID) | ORAL | 0 refills | Status: AC | PRN

## 2018-04-03 MED ORDER — DEXAMETHASONE SODIUM PHOSPHATE 10 MG/ML IJ SOLN
INTRAMUSCULAR | Status: AC
  Filled 2018-04-03: qty 1

## 2018-04-03 MED ORDER — CEFAZOLIN SODIUM-DEXTROSE 2-4 GM/100ML-% IV SOLN
INTRAVENOUS | Status: AC
  Filled 2018-04-03: qty 100

## 2018-04-03 MED ORDER — LACTATED RINGERS IV SOLN
INTRAVENOUS | Status: DC
  Filled 2018-04-03: qty 1000

## 2018-04-03 MED ORDER — KETOROLAC TROMETHAMINE 30 MG/ML IJ SOLN
INTRAMUSCULAR | Status: DC | PRN
  Administered 2018-04-03: 30 mg via INTRAVENOUS

## 2018-04-03 MED ORDER — HYDROMORPHONE HCL 1 MG/ML IJ SOLN
0.2500 mg | INTRAMUSCULAR | Status: DC | PRN
  Filled 2018-04-03: qty 0.5

## 2018-04-03 MED ORDER — ONDANSETRON HCL 4 MG PO TABS: 4.0000 mg | ORAL_TABLET | Freq: Three times a day (TID) | ORAL | 0 refills | Status: AC | PRN

## 2018-04-03 MED ORDER — GABAPENTIN 300 MG PO CAPS: 300.0000 mg | ORAL_CAPSULE | Freq: Two times a day (BID) | ORAL | 0 refills | Status: AC

## 2018-04-03 MED ORDER — GABAPENTIN 300 MG PO CAPS
ORAL_CAPSULE | ORAL | Status: AC
  Filled 2018-04-03: qty 1

## 2018-04-03 MED ORDER — PROPOFOL 10 MG/ML IV BOLUS
INTRAVENOUS | Status: AC
  Filled 2018-04-03: qty 20

## 2018-04-03 MED ORDER — CHLORHEXIDINE GLUCONATE 4 % EX LIQD
60.0000 mL | Freq: Once | CUTANEOUS | Status: DC
  Filled 2018-04-03: qty 118

## 2018-04-03 MED ORDER — GABAPENTIN 300 MG PO CAPS
300.0000 mg | ORAL_CAPSULE | Freq: Once | ORAL | Status: AC
  Administered 2018-04-03: 300 mg via ORAL
  Filled 2018-04-03: qty 1

## 2018-04-03 MED ORDER — PROMETHAZINE HCL 25 MG/ML IJ SOLN
6.2500 mg | INTRAMUSCULAR | Status: DC | PRN
  Filled 2018-04-03: qty 1

## 2018-04-03 MED ORDER — MEPERIDINE HCL 25 MG/ML IJ SOLN
6.2500 mg | INTRAMUSCULAR | Status: DC | PRN
  Filled 2018-04-03: qty 1

## 2018-04-03 SURGICAL SUPPLY — 76 items
BANDAGE ACE 4X5 VEL STRL LF (GAUZE/BANDAGES/DRESSINGS) ×3 IMPLANT
BANDAGE ACE 6X5 VEL STRL LF (GAUZE/BANDAGES/DRESSINGS) ×3 IMPLANT
BANDAGE ELASTIC 4 VELCRO ST LF (GAUZE/BANDAGES/DRESSINGS) ×3 IMPLANT
BANDAGE ELASTIC 6 VELCRO ST LF (GAUZE/BANDAGES/DRESSINGS) ×3 IMPLANT
BANDAGE ESMARK 6X9 LF (GAUZE/BANDAGES/DRESSINGS) ×1 IMPLANT
BIT DRILL 2.5 CANN STRL (BIT) ×3 IMPLANT
BIT DRILL 3.5 CANN STRL (BIT) ×3 IMPLANT
BLADE SURG 15 STRL LF DISP TIS (BLADE) ×2 IMPLANT
BLADE SURG 15 STRL SS (BLADE) ×4
BNDG COHESIVE 4X5 TAN STRL (GAUZE/BANDAGES/DRESSINGS) ×3 IMPLANT
BNDG ESMARK 6X9 LF (GAUZE/BANDAGES/DRESSINGS) ×3
CHLORAPREP W/TINT 26ML (MISCELLANEOUS) ×3 IMPLANT
CLOSURE STERI-STRIP 1/2X4 (GAUZE/BANDAGES/DRESSINGS) ×1
CLSR STERI-STRIP ANTIMIC 1/2X4 (GAUZE/BANDAGES/DRESSINGS) ×2 IMPLANT
COVER BACK TABLE 60X90IN (DRAPES) ×3 IMPLANT
COVER MAYO STAND STRL (DRAPES) ×3 IMPLANT
COVER SURGICAL LIGHT HANDLE (MISCELLANEOUS) ×3 IMPLANT
CUFF TOURNIQUET SINGLE 24IN (TOURNIQUET CUFF) IMPLANT
CUFF TOURNIQUET SINGLE 34IN LL (TOURNIQUET CUFF) ×3 IMPLANT
DECANTER SPIKE VIAL GLASS SM (MISCELLANEOUS) IMPLANT
DRAPE EXTREMITY T 121X128X90 (DRAPE) ×3 IMPLANT
DRAPE IMP U-DRAPE 54X76 (DRAPES) ×3 IMPLANT
DRAPE OEC MINIVIEW 54X84 (DRAPES) ×3 IMPLANT
DRAPE U-SHAPE 47X51 STRL (DRAPES) ×3 IMPLANT
DRSG EMULSION OIL 3X3 NADH (GAUZE/BANDAGES/DRESSINGS) ×3 IMPLANT
DRSG PAD ABDOMINAL 8X10 ST (GAUZE/BANDAGES/DRESSINGS) ×3 IMPLANT
ELECT REM PT RETURN 9FT ADLT (ELECTROSURGICAL) ×3
ELECTRODE REM PT RTRN 9FT ADLT (ELECTROSURGICAL) ×1 IMPLANT
GAUZE SPONGE 4X4 12PLY STRL (GAUZE/BANDAGES/DRESSINGS) ×3 IMPLANT
GLOVE BIO SURGEON STRL SZ7.5 (GLOVE) ×6 IMPLANT
GLOVE BIOGEL PI IND STRL 8 (GLOVE) ×2 IMPLANT
GLOVE BIOGEL PI INDICATOR 8 (GLOVE) ×4
GOWN STRL REUS W/ TWL LRG LVL3 (GOWN DISPOSABLE) ×2 IMPLANT
GOWN STRL REUS W/ TWL XL LVL3 (GOWN DISPOSABLE) ×1 IMPLANT
GOWN STRL REUS W/TWL LRG LVL3 (GOWN DISPOSABLE) ×4
GOWN STRL REUS W/TWL XL LVL3 (GOWN DISPOSABLE) ×2
IMPL TIGHTROP W/DRV K-LESS (Anchor) ×1 IMPLANT
IMPLANT TIGHTROPE W/DRV K-LESS (Anchor) ×3 IMPLANT
NEEDLE HYPO 22GX1.5 SAFETY (NEEDLE) IMPLANT
NS IRRIG 1000ML POUR BTL (IV SOLUTION) ×3 IMPLANT
PACK BASIN DAY SURGERY FS (CUSTOM PROCEDURE TRAY) ×3 IMPLANT
PAD ABD 8X10 STRL (GAUZE/BANDAGES/DRESSINGS) ×3 IMPLANT
PAD CAST 4YDX4 CTTN HI CHSV (CAST SUPPLIES) ×1 IMPLANT
PADDING CAST ABS 4INX4YD NS (CAST SUPPLIES) ×4
PADDING CAST ABS COTTON 4X4 ST (CAST SUPPLIES) ×2 IMPLANT
PADDING CAST COTTON 4X4 STRL (CAST SUPPLIES) ×2
PADDING CAST COTTON 6X4 STRL (CAST SUPPLIES) ×3 IMPLANT
PENCIL BUTTON HOLSTER BLD 10FT (ELECTRODE) ×3 IMPLANT
PLATE THIRD TUBULAR 7 HOLE (Plate) ×3 IMPLANT
SCREW CANC T15 FT 12X4XST LCK (Screw) ×1 IMPLANT
SCREW CANCELLOUS 4.0X12MM (Screw) ×2 IMPLANT
SCREW CANCELLOUS 4X14 (Screw) ×3 IMPLANT
SCREW NLOCK T15 FT 18X3.5XST (Screw) ×1 IMPLANT
SCREW NON LOCK 3.5X18MM (Screw) ×2 IMPLANT
SCREW NON-LOCKING 3.5X12MM (Screw) ×12 IMPLANT
SLEEVE SCD COMPRESS KNEE MED (MISCELLANEOUS) IMPLANT
SPLINT FAST PLASTER 5X30 (CAST SUPPLIES) ×2
SPLINT PLASTER CAST FAST 5X30 (CAST SUPPLIES) ×1 IMPLANT
SPONGE LAP 4X18 RFD (DISPOSABLE) ×3 IMPLANT
SUCTION FRAZIER HANDLE 10FR (MISCELLANEOUS) ×2
SUCTION TUBE FRAZIER 10FR DISP (MISCELLANEOUS) ×1 IMPLANT
SUT ETHILON 3 0 PS 1 (SUTURE) ×3 IMPLANT
SUT MNCRL AB 4-0 PS2 18 (SUTURE) IMPLANT
SUT MON AB 2-0 CT1 36 (SUTURE) IMPLANT
SUT MON AB 3-0 SH 27 (SUTURE)
SUT MON AB 3-0 SH27 (SUTURE) IMPLANT
SUT VIC AB 0 SH 27 (SUTURE) ×6 IMPLANT
SUT VIC AB 2-0 SH 27 (SUTURE)
SUT VIC AB 2-0 SH 27XBRD (SUTURE) IMPLANT
SYR BULB 3OZ (MISCELLANEOUS) ×3 IMPLANT
SYR CONTROL 10ML LL (SYRINGE) IMPLANT
TOWEL OR 17X24 6PK STRL BLUE (TOWEL DISPOSABLE) ×6 IMPLANT
TOWEL OR NON WOVEN STRL DISP B (DISPOSABLE) ×3 IMPLANT
TUBE CONNECTING 12'X1/4 (SUCTIONS) ×1
TUBE CONNECTING 12X1/4 (SUCTIONS) ×2 IMPLANT
UNDERPAD 30X30 (UNDERPADS AND DIAPERS) ×3 IMPLANT

## 2018-04-03 NOTE — Anesthesia Preprocedure Evaluation (Signed)
Anesthesia Evaluation  Patient identified by MRN, date of birth, ID band Patient awake    Reviewed: Allergy & Precautions, H&P , NPO status , Patient's Chart, lab work & pertinent test results  Airway Mallampati: I  TM Distance: >3 FB Neck ROM: Full    Dental no notable dental hx. (+) Edentulous Upper, Lower Dentures   Pulmonary former smoker,    Pulmonary exam normal breath sounds clear to auscultation       Cardiovascular hypertension, Pt. on medications Normal cardiovascular exam Rhythm:Regular Rate:Normal     Neuro/Psych CVA, Residual Symptoms    GI/Hepatic   Endo/Other  diabetes, Insulin Dependent  Renal/GU Renal disease     Musculoskeletal   Abdominal   Peds  Hematology   Anesthesia Other Findings   Reproductive/Obstetrics                             Anesthesia Physical  Anesthesia Plan  ASA: III  Anesthesia Plan: General   Post-op Pain Management:  Regional for Post-op pain   Induction: Intravenous  PONV Risk Score and Plan: 2 and Ondansetron and Midazolam  Airway Management Planned: LMA  Additional Equipment:   Intra-op Plan:   Post-operative Plan: Extubation in OR  Informed Consent: I have reviewed the patients History and Physical, chart, labs and discussed the procedure including the risks, benefits and alternatives for the proposed anesthesia with the patient or authorized representative who has indicated his/her understanding and acceptance.   Dental advisory given  Plan Discussed with: CRNA and Surgeon  Anesthesia Plan Comments:         Anesthesia Quick Evaluation

## 2018-04-03 NOTE — Transfer of Care (Signed)
Immediate Anesthesia Transfer of Care Note  Patient: Cesar Ali  Procedure(s) Performed: Procedure(s) (LRB): OPEN REDUCTION INTERNAL FIXATION (ORIF)  LEFT ANKLE FRACTURE (Left)  Patient Location: PACU  Anesthesia Type: General  Level of Consciousness: awake, sedated, patient cooperative and responds to stimulation  Airway & Oxygen Therapy: Patient Spontanous Breathing and Patient connected to [place] oxygen  Post-op Assessment: Report given to PACU RN, Post -op Vital signs reviewed and stable and Patient moving all extremities  Post vital signs: Reviewed and stable  Complications: No apparent anesthesia complications

## 2018-04-03 NOTE — Anesthesia Procedure Notes (Signed)
Anesthesia Regional Block: Popliteal block   Pre-Anesthetic Checklist: ,, timeout performed, Correct Patient, Correct Site, Correct Laterality, Correct Procedure, Correct Position, site marked, Risks and benefits discussed,  Surgical consent,  Pre-op evaluation,  At surgeon's request and post-op pain management  Laterality: Left  Prep: chloraprep       Needles:  Injection technique: Single-shot  Needle Type: Stimiplex     Needle Length: 9cm  Needle Gauge: 21     Additional Needles:   Procedures:,,,, ultrasound used (permanent image in chart),,,,  Narrative:  Start time: 04/03/2018 8:45 AM End time: 04/03/2018 8:50 AM Injection made incrementally with aspirations every 5 mL.  Performed by: Personally  Anesthesiologist: Lowella Curb, MD

## 2018-04-03 NOTE — Discharge Instructions (Signed)
Elevate leg - Toes above nose as much as possible to reduce pain / swelling. If needed, you may increase breakthrough pain medication (percocet) for the first few days post op to 2 tablets every 4 hours.  Reduce then Stop this medicine as soon as you are able.  You may loosen and re-apply ace wrap if it feels too tight.  Weight Bearing:  Non weight bearing affected leg.  Diet: As you were doing prior to hospitalization   Shower:  You have a splint on, leave the splint in place and keep the splint dry with a plastic bag.  Dressing:  You have a splint. Leave the splint in place and we will change your bandages during your first follow-up appointment.  Do not walk on splint.  Activity:  Increase activity slowly as tolerated, but follow the weight bearing instructions below.  The rules on driving is that you can not be taking narcotics while you drive, and you must feel in control of the vehicle.    To prevent constipation:  Narcotic medicines cause constipation.  Wean these as soon as is appropriate.   You may use a stool softener such as -  Colace (over the counter) 100 mg by mouth twice a day  Drink plenty of fluids (prune juice may be helpful) and high fiber foods Miralax (over the counter) for constipation as needed.    Itching:  If you experience itching with your medications, try taking only a single pain pill, or even half a pain pill at a time.  You can also use benadryl over the counter for itching or also to help with sleep.   Precautions:  If you experience chest pain or shortness of breath - call 911 immediately for transfer to the hospital emergency department!!  If you develop a fever greater that 101 F, purulent drainage from wound, increased redness or drainage from wound, or calf pain -- Call the office at [phone]                                                 Follow- Up Appointment:  Please call for an appointment to be seen in 2 weeks [place] - [phone]  [phone]  No advil, aleve, motrin, ibuprofen until 3:45 pm today   Cast or Splint Care, Adult Casts and splints are supports that are worn to protect broken bones and other injuries. A cast or splint may hold a bone still and in the correct position while it heals. Casts and splints may also help ease pain, swelling, and muscle spasms. A cast is a hardened support that is usually made of fiberglass or plaster. It is custom-fit to the body and it offers more protection than a splint. It cannot be taken off and put back on. A splint is a type of soft support that is usually made from cloth and elastic. It can be adjusted or taken off as needed. You may need a cast or a splint if you:  Have a broken bone.  Have a soft-tissue injury.  Need to keep an injured body part from moving (keep it immobile) after surgery.  How is this treated? If you have a cast:  Do not stick anything inside the cast to scratch your skin. Sticking something in the cast increases your risk of infection.  Check the skin around the cast every  day. Tell your health care provider about any concerns.  You may put lotion on dry skin around the edges of the cast. Do not put lotion on the skin underneath the cast.  Keep the cast clean.  If the cast is not waterproof: ? Do not let it get wet. ? Cover it with a watertight covering when you take a bath or a shower. If you have a splint:  Wear it as told by your health care provider. Remove it only as told by your health care provider.  Loosen the splint if your fingers or toes tingle, become numb, or turn cold and blue.  Keep the splint clean.  If the splint is not waterproof: ? Do not let it get wet. ? Cover it with a watertight covering when you take a bath or a shower. Bathing  Do not take baths or swim until your health care provider approves. Ask your health care provider if you can take showers. You may only be allowed to take sponge baths for bathing.  If  your cast or splint is not waterproof, cover it with a watertight covering when you take a bath or shower. Managing pain, stiffness, and swelling  Move your fingers or toes often to avoid stiffness and to lessen swelling.  Raise (elevate) the injured area above the level of your heart while sitting or lying down. Safety  Do not use the injured limb to support your body weight until your health care provider says that it is okay.  Use crutches or other assistive devices as told by your health care provider. General instructions  Do not put pressure on any part of the cast or splint until it is fully hardened. This may take several hours.  Return to your normal activities as told by your health care provider. Ask your health care provider what activities are safe for you.  Take over-the-counter and prescription medicines only as told by your health care provider.  Keep all follow-up visits as told by your health care provider. This is important. Contact a health care provider if:  Your cast or splint gets damaged.  The skin around the cast gets red or raw.  The skin under the cast is extremely itchy or painful.  Your cast or splint feels very uncomfortable.  Your cast or splint is too tight or too loose.  Your cast becomes wet or it develops a soft spot or area.  You get an object stuck under your cast. Get help right away if:  Your pain is getting worse.  The injured area tingles, becomes numb, or turns cold and blue.  The part of your body above or below the cast is swollen and discolored.  You cannot feel or move your fingers or toes.  There is fluid leaking through the cast.  You have severe pain or pressure under the cast.  You have trouble breathing.  You have shortness of breath.  You have chest pain. This information is not intended to replace advice given to you by your health care provider. Make sure you discuss any questions you have with your health  care provider. Document Released: 05/27/2000 Document Revised: 12/19/2015 Document Reviewed: 11/21/2015 Elsevier Interactive Patient Education  2018 [employer].  Regional Anesthesia Blocks  1. Numbness or the inability to move the "blocked" extremity may last from 3-48 hours after placement. The length of time depends on the medication injected and your individual response to the medication. If the numbness is not going  away after 48 hours, call your surgeon.  2. The extremity that is blocked will need to be protected until the numbness is gone and the  Strength has returned. Because you cannot feel it, you will need to take extra care to avoid injury. Because it may be weak, you may have difficulty moving it or using it. You may not know what position it is in without looking at it while the block is in effect.  3. For blocks in the legs and feet, returning to weight bearing and walking needs to be done carefully. You will need to wait until the numbness is entirely gone and the strength has returned. You should be able to move your leg and foot normally before you try and bear weight or walk. You will need someone to be with you when you first try to ensure you do not fall and possibly risk injury.  4. Bruising and tenderness at the needle site are common side effects and will resolve in a few days.  5. Persistent numbness or new problems with movement should be communicated to the surgeon or the [facility] [phone] [facility] [phone]).  Post Anesthesia Home Care Instructions  Activity: Get plenty of rest for the remainder of the day. A responsible adult should stay with you for 24 hours following the procedure.  For the next 24 hours, DO NOT: -Drive a car -Advertising copywriter -Drink alcoholic beverages -Take any medication unless instructed by your physician -Make any legal decisions or sign important papers.  Meals: Start with liquid foods such  as gelatin or soup. Progress to regular foods as tolerated. Avoid greasy, spicy, heavy foods. If nausea and/or vomiting occur, drink only clear liquids until the nausea and/or vomiting subsides. Call your physician if vomiting continues.  Special Instructions/Symptoms: Your throat may feel dry or sore from the anesthesia or the breathing tube placed in your throat during surgery. If this causes discomfort, gargle with warm salt water. The discomfort should disappear within 24 hours.  If you had a scopolamine patch placed behind your ear for the management of post- operative nausea and/or vomiting:  1. The medication in the patch is effective for 72 hours, after which it should be removed.  Wrap patch in a tissue and discard in the trash. Wash hands thoroughly with soap and water. 2. You may remove the patch earlier than 72 hours if you experience unpleasant side effects which may include dry mouth, dizziness or visual disturbances. 3. Avoid touching the patch. Wash your hands with soap and water after contact with the patch.

## 2018-04-03 NOTE — Anesthesia Procedure Notes (Signed)
Procedure Name: LMA Insertion Date/Time: 04/03/2018 9:11 AM Performed by: Jessica Priest, CRNA Pre-anesthesia Checklist: Patient identified, Emergency Drugs available, Suction available and Patient being monitored Patient Re-evaluated:Patient Re-evaluated prior to induction Oxygen Delivery Method: Circle system utilized Preoxygenation: Pre-oxygenation with 100% oxygen Induction Type: IV induction Ventilation: Mask ventilation without difficulty LMA: LMA inserted LMA Size: 5.0 Number of attempts: 1 Airway Equipment and Method: Bite block Placement Confirmation: positive ETCO2 and breath sounds checked- equal and bilateral Tube secured with: Tape Dental Injury: Teeth and Oropharynx as per pre-operative assessment  Comments: Dentures removed, large amount old adhesive orally suctioned prior to LMA placement

## 2018-04-03 NOTE — Progress Notes (Signed)
1610 time out for block with Dr. Hyacinth Meeker and Nurse Versed 2 and Fentanyl 2 ml given for sedation. Tolerated well. 0850 block completed

## 2018-04-03 NOTE — Op Note (Signed)
04/03/2018  10:00 AM  PATIENT:  Cesar Ali    PRE-OPERATIVE DIAGNOSIS:  LEFT ANKLE FRACURE  POST-OPERATIVE DIAGNOSIS:  Same  PROCEDURE:  OPEN REDUCTION INTERNAL FIXATION (ORIF)  LEFT ANKLE FRACTURE  SURGEON:  Sheral Apley, MD  ASSISTANT: Aquilla Hacker, PA-C, he was present and scrubbed throughout the case, critical for completion in a timely fashion, and for retraction, instrumentation, and closure.   ANESTHESIA:   gen   PREOPERATIVE INDICATIONS:  Cesar Ali is a  52 y.o. male with a diagnosis of LEFT ANKLE FRACURE who failed conservative measures and elected for surgical management.    The risks benefits and alternatives were discussed with the patient preoperatively including but not limited to the risks of infection, bleeding, nerve injury, cardiopulmonary complications, the need for revision surgery, among others, and the patient was willing to proceed.  OPERATIVE IMPLANTS: arthrex 1/3 plate and tight rope  OPERATIVE FINDINGS: Unstable ankle fracture. Stable syndesmosis post op  BLOOD LOSS: min  COMPLICATIONS: none  TOURNIQUET TIME:  OPERATIVE PROCEDURE:  Patient was identified in the preoperative holding area and site was marked by me He was transported to the operating theater and placed on the table in supine position taking care to pad all bony prominences. After a preincinduction time out anesthesia was induced. The left lower extremity was prepped and draped in normal sterile fashion and a pre-incision timeout was performed. Cesar Ali received ancef for preoperative antibiotics.   I made a lateral incision of roughly 7 cm dissection was carried down sharply to the distal fibula and then spreading dissection was used proximally to protect the superficial peroneal nerve. I sharply incised the periosteum and took care to protect the peroneal tendons. I then debrided the fracture site and performed a reduction maneuver which was held in place  with a clamp.   I placed a lag screw across the fracture  I then selected a 7-hole one third tubular plate and placed in a neutralization fashion care was taken distally so as not to penetrate the joint with the cancellus screws.  I then stressed the syndesmosis and it was unstable for syndesmotic fixation I performed a reduction maneuver with a clamp and placed a tight rope  The wound was then thoroughly irrigated and closed using a 0 Vicryl and absorbable Monocryl sutures. He was placed in a short leg splint.   POST OPERATIVE PLAN: Non-weightbearing. DVT prophylaxis will consist of mobilization and chemical px

## 2018-04-03 NOTE — Anesthesia Postprocedure Evaluation (Signed)
Anesthesia Post Note  Patient: Cesar Ali  Procedure(s) Performed: OPEN REDUCTION INTERNAL FIXATION (ORIF)  LEFT ANKLE FRACTURE (Left Ankle)     Patient location during evaluation: PACU Anesthesia Type: General Level of consciousness: awake and alert Pain management: pain level controlled Vital Signs Assessment: post-procedure vital signs reviewed and stable Respiratory status: spontaneous breathing, nonlabored ventilation and respiratory function stable Cardiovascular status: blood pressure returned to baseline and stable Postop Assessment: no apparent nausea or vomiting Anesthetic complications: no    Last Vitals:  Vitals:   04/03/18 1123 04/03/18 1130  BP:  (!) 154/103  Pulse: 81 78  Resp: (!) 9 14  Temp:    SpO2: 100% 99%    Last Pain:  Vitals:   04/03/18 1130  TempSrc:   PainSc: 0-No pain                 Lowella Curb

## 2018-04-04 ENCOUNTER — Encounter (HOSPITAL_BASED_OUTPATIENT_CLINIC_OR_DEPARTMENT_OTHER): Payer: Self-pay | Admitting: Orthopedic Surgery

## 2018-04-05 NOTE — Addendum Note (Signed)
Addendum  created 04/05/18 1022 by Francie Massing, CRNA   Charge Capture section accepted

## 2018-05-01 ENCOUNTER — Encounter: Payer: Medicaid Other | Attending: Physician Assistant | Admitting: Physician Assistant

## 2018-05-01 DIAGNOSIS — Y838 Other surgical procedures as the cause of abnormal reaction of the patient, or of later complication, without mention of misadventure at the time of the procedure: Secondary | ICD-10-CM | POA: Insufficient documentation

## 2018-05-01 DIAGNOSIS — Z8673 Personal history of transient ischemic attack (TIA), and cerebral infarction without residual deficits: Secondary | ICD-10-CM | POA: Diagnosis not present

## 2018-05-01 DIAGNOSIS — Z794 Long term (current) use of insulin: Secondary | ICD-10-CM | POA: Diagnosis not present

## 2018-05-01 DIAGNOSIS — E1022 Type 1 diabetes mellitus with diabetic chronic kidney disease: Secondary | ICD-10-CM | POA: Insufficient documentation

## 2018-05-01 DIAGNOSIS — L97322 Non-pressure chronic ulcer of left ankle with fat layer exposed: Secondary | ICD-10-CM | POA: Diagnosis not present

## 2018-05-01 DIAGNOSIS — E10622 Type 1 diabetes mellitus with other skin ulcer: Secondary | ICD-10-CM | POA: Insufficient documentation

## 2018-05-01 DIAGNOSIS — Z87891 Personal history of nicotine dependence: Secondary | ICD-10-CM | POA: Insufficient documentation

## 2018-05-01 DIAGNOSIS — N183 Chronic kidney disease, stage 3 (moderate): Secondary | ICD-10-CM | POA: Diagnosis not present

## 2018-05-01 DIAGNOSIS — T8131XA Disruption of external operation (surgical) wound, not elsewhere classified, initial encounter: Secondary | ICD-10-CM | POA: Diagnosis present

## 2018-05-01 DIAGNOSIS — Z8249 Family history of ischemic heart disease and other diseases of the circulatory system: Secondary | ICD-10-CM | POA: Insufficient documentation

## 2018-05-04 NOTE — Progress Notes (Signed)
Cesar Ali, Cesar D. ([id number]) Visit Report for 05/01/2018 Abuse/Suicide Risk Screen Details Patient Name: Cesar Ali, Cesar D. Date of Service: 05/01/2018 2:30 PM Medical Record Number: [id number] Patient Account Number: [id number] Date of Birth/Sex: 09/20/1965 (52 y.o. M) Treating RN: Huel CoventryWoody, Kim Primary Care Ryanna Teschner: Corine ShelterKILPATRICK, GEORGE Other Clinician: Referring Randal Yepiz: Ruben GottronMARTENSEN III, [name] Treating Micca Matura/Extender: Linwood DibblesSTONE III, HOYT Weeks in Treatment: 0 Abuse/Suicide Risk Screen Items Answer ABUSE/SUICIDE RISK SCREEN: Has anyone close to you tried to hurt or harm you recentlyo No Do you feel uncomfortable with anyone in your familyo No Has anyone forced you do things that you didnot want to doo No Do you have any thoughts of harming yourselfo No Patient displays signs or symptoms of abuse and/or neglect. No Electronic Signature(s) Signed: 05/02/2018 5:21:06 PM By: Elliot GurneyWoody, BSN, RN, CWS, Kim RN, BSN Entered By: Elliot GurneyWoody, BSN, RN, CWS, Kim on 05/01/2018 14:50:41 Cesar Ali, Cesar D. ([id number]) -------------------------------------------------------------------------------- Activities of Daily Living Details Patient Name: Cesar Ali, Cesar D. Date of Service: 05/01/2018 2:30 PM Medical Record Number: [id number] Patient Account Number: [id number] Date of Birth/Sex: 09/20/1965 (52 y.o. M) Treating RN: Huel CoventryWoody, Kim Primary Care Adib Wahba: Corine ShelterKILPATRICK, GEORGE Other Clinician: Referring Jalyn Dutta: Ruben GottronMARTENSEN III, [name] Treating Arleigh Dicola/Extender: Linwood DibblesSTONE III, HOYT Weeks in Treatment: 0 Activities of Daily Living Items Answer Activities of Daily Living (Please select one for each item) Drive Automobile Completely Able Take Medications Completely Able Use Telephone Completely Able Care for Appearance Completely Able Use Toilet Completely Able Bath / Shower Completely Able Dress Self Completely Able Feed Self Completely Able Walk Completely Able Get In / Out Bed Completely Able Housework  Completely Able Prepare Meals Completely Able Handle Money Completely Able Shop for Self Completely Able Electronic Signature(s) Signed: 05/02/2018 5:21:06 PM By: Elliot GurneyWoody, BSN, RN, CWS, Kim RN, BSN Entered By: Elliot GurneyWoody, BSN, RN, CWS, Kim on 05/01/2018 14:50:53 Cesar Ali, Cesar D. ([id number]) -------------------------------------------------------------------------------- Education Assessment Details Patient Name: Cesar Ali, Cesar D. Date of Service: 05/01/2018 2:30 PM Medical Record Number: [id number] Patient Account Number: [id number] Date of Birth/Sex: 09/20/1965 (52 y.o. M) Treating RN: Huel CoventryWoody, Kim Primary Care Dacey Milberger: Corine ShelterKILPATRICK, GEORGE Other Clinician: Referring Briana Newman: Ruben GottronMARTENSEN III, [name] Treating Boniface Goffe/Extender: Skeet SimmerSTONE III, HOYT Weeks in Treatment: 0 Learning Preferences/Education Level/Primary Language Highest Education Level: College or Above Preferred Language: English Cognitive Barrier Assessment/Beliefs Language Barrier: No Translator Needed: No Memory Deficit: No Emotional Barrier: No Cultural/Religious Beliefs Affecting Medical Care: No Physical Barrier Assessment Impaired Vision: No Impaired Hearing: No Decreased Hand dexterity: No Knowledge/Comprehension Assessment Knowledge Level: High Comprehension Level: High Ability to understand written High instructions: Ability to understand verbal High instructions: Motivation Assessment Anxiety Level: Calm Cooperation: Cooperative Education Importance: Acknowledges Need Interest in Health Problems: Asks Questions Perception: Coherent Willingness to Engage in Self- High Management Activities: Readiness to Engage in Self- High Management Activities: Electronic Signature(s) Signed: 05/02/2018 5:21:06 PM By: Elliot GurneyWoody, BSN, RN, CWS, Kim RN, BSN Entered By: Elliot GurneyWoody, BSN, RN, CWS, Kim on 05/01/2018 14:51:17 Cesar Ali, Prince D.  ([id number]) -------------------------------------------------------------------------------- Fall Risk Assessment Details Patient Name: Cesar Ali, Cesar D. Date of Service: 05/01/2018 2:30 PM Medical Record Number: [id number] Patient Account Number: [id number] Date of Birth/Sex: 09/20/1965 (52 y.o. M) Treating RN: Huel CoventryWoody, Kim Primary Care Casimir Barcellos: Corine ShelterKILPATRICK, GEORGE Other Clinician: Referring Tanis Hensarling: Ruben GottronMARTENSEN III, [name] Treating Daylah Sayavong/Extender: Linwood DibblesSTONE III, HOYT Weeks in Treatment: 0 Fall Risk Assessment Items Have you had 2 or more falls in the last 12 monthso 0 Yes Have you had any fall that resulted in injury in the last 12 monthso 0 Yes FALL RISK ASSESSMENT: History of falling - immediate  or within 3 months 0 No Secondary diagnosis 0 No Ambulatory aid None/bed rest/wheelchair/nurse 0 No Crutches/cane/walker 15 Yes Furniture 0 No IV Access/Saline Lock 0 No Gait/Training Normal/bed rest/immobile 0 No Weak 0 No Impaired 20 Yes Mental Status Oriented to own ability 0 Yes Electronic Signature(s) Signed: 05/02/2018 5:21:06 PM By: Elliot Gurney, BSN, RN, CWS, Kim RN, BSN Entered By: Elliot Gurney, BSN, RN, CWS, Kim on 05/01/2018 14:51:40 Cesar Ali, Cesar Ali ([id number]) -------------------------------------------------------------------------------- Foot Assessment Details Patient Name: Cesar Ali, MONFORTE. Date of Service: 05/01/2018 2:30 PM Medical Record Number: [id number] Patient Account Number: [id number] Date of Birth/Sex: 07/23/1965 (52 y.o. M) Treating RN: Huel Coventry Primary Care Rober Skeels: Corine Shelter Other Clinician: Referring Darvin Dials: Ruben Gottron Treating Yago Ludvigsen/Extender: Linwood Dibbles, HOYT Weeks in Treatment: 0 Foot Assessment Items Site Locations + = Sensation present, - = Sensation absent, C = Callus, U = Ulcer R = Redness, W = Warmth, M = Maceration, PU = Pre-ulcerative lesion F = Fissure, S = Swelling, D = Dryness Assessment Right: Left: Other Deformity:  No No Prior Foot Ulcer: No No Prior Amputation: No No Charcot Joint: No No Ambulatory Status: Ambulatory With Help Assistance Device: Crutches Gait: Surveyor, mining) Signed: 05/02/2018 5:21:06 PM By: Elliot Gurney, BSN, RN, CWS, Kim RN, BSN Entered By: Elliot Gurney, BSN, RN, CWS, Kim on 05/01/2018 14:58:43 CHIBUIKE, FLEEK ([id number]) -------------------------------------------------------------------------------- Nutrition Risk Assessment Details Patient Name: CHARES, SLAYMAKER. Date of Service: 05/01/2018 2:30 PM Medical Record Number: [id number] Patient Account Number: [id number] Date of Birth/Sex: 1965/09/14 (52 y.o. M) Treating RN: Huel Coventry Primary Care Renae Mottley: Corine Shelter Other Clinician: Referring Verner Mccrone: Ruben Gottron Treating Atzel Mccambridge/Extender: Linwood Dibbles, HOYT Weeks in Treatment: 0 Height (in): 72 Weight (lbs): 178 Body Mass Index (BMI): 24.1 Nutrition Risk Assessment Items NUTRITION RISK SCREEN: I have an illness or condition that made me change the kind and/or amount of 0 No food I eat I eat fewer than two meals per day 0 No I eat few fruits and vegetables, or milk products 0 No I have three or more drinks of beer, liquor or wine almost every day 0 No I have tooth or mouth problems that make it hard for me to eat 0 No I don't always have enough money to buy the food I need 0 No I eat alone most of the time 0 No I take three or more different prescribed or over-the-counter drugs a day 0 No Without wanting to, I have lost or gained 10 pounds in the last six months 0 No I am not always physically able to shop, cook and/or feed myself 0 No Nutrition Protocols Good Risk Protocol 0 No interventions needed Moderate Risk Protocol Electronic Signature(s) Signed: 05/02/2018 5:21:06 PM By: Elliot Gurney, BSN, RN, CWS, Kim RN, BSN Entered By: Elliot Gurney, BSN, RN, CWS, Kim on 05/01/2018 14:51:59

## 2018-05-04 NOTE — Progress Notes (Signed)
Cesar Ali, Cesar D. ([id number]) Visit Report for 05/01/2018 Allergy List Details Patient Name: Cesar Ali, Cesar D. Date of Service: 05/01/2018 2:30 PM Medical Record Number: [id number] Patient Account Number: [id number] Date of Birth/Sex: 1966-02-03 (52 y.o. M) Treating RN: Cesar Ali Primary Care Declin Rajan: Cesar ShelterKILPATRICK, Ali Other Clinician: Referring Cesar Ali: Cesar GottronMARTENSEN III, Ali Treating Ellah Otte/Extender: Cesar Ali Weeks in Treatment: 0 Allergies Active Allergies No Known Allergies Allergy Notes Electronic Signature(s) Signed: 05/02/2018 5:21:06 PM By: Cesar GurneyWoody, BSN, RN, CWS, Kim RN, BSN Entered By: Cesar Ali on 05/01/2018 14:43:32 Cesar Ali, Cesar D. ([id number]) -------------------------------------------------------------------------------- Arrival Information Details Patient Name: Cesar Ali, Cesar D. Date of Service: 05/01/2018 2:30 PM Medical Record Number: [id number] Patient Account Number: [id number] Date of Birth/Sex: 1966-02-03 (52 y.o. M) Treating RN: Cesar Ali Primary Care Cesar Ali Other Clinician: Referring Cesar Ali: Cesar GottronMARTENSEN III, Ali Treating Jamiyla Ishee/Extender: Cesar Ali Weeks in Treatment: 0 Visit Information Patient Arrived: Crutches Arrival Time: 14:40 Accompanied By: self Transfer Assistance: None Patient Identification Verified: Yes Secondary Verification Process Yes Completed: Patient Has Alerts: Yes Patient Alerts: Patient on Blood Thinner Type One Diabetic Plavix 81 mg aspirin Electronic Signature(s) Signed: 05/02/2018 5:21:06 PM By: Cesar GurneyWoody, BSN, RN, CWS, Kim RN, BSN Entered By: Cesar Ali on 05/01/2018 14:41:51 Cesar Ali, Cesar D. ([id number]) -------------------------------------------------------------------------------- Clinic Level of Care Assessment Details Patient Name: Cesar Ali, Cesar D. Date of Service: 05/01/2018 2:30 PM Medical Record Number: [id number] Patient Account  Number: [id number] Date of Birth/Sex: 1966-02-03 (52 y.o. M) Treating RN: Cesar Ali Primary Care Lavetta Geier: Cesar ShelterKILPATRICK, Ali Other Clinician: Referring Cesar Ali: Cesar GottronMARTENSEN III, Ali Treating Uliana Brinker/Extender: Cesar Ali Weeks in Treatment: 0 Clinic Level of Care Assessment Items TOOL 1 Quantity Score []  - Use when EandM and Procedure is performed on INITIAL visit 0 ASSESSMENTS - Nursing Assessment / Reassessment X - General Physical Exam (combine w/ comprehensive assessment (listed just below) when 1 20 performed on new pt. evals) X- 1 25 Comprehensive Assessment (HX, ROS, Risk Assessments, Wounds Hx, etc.) ASSESSMENTS - Wound and Skin Assessment / Reassessment []  - Dermatologic / Skin Assessment (not related to wound area) 0 ASSESSMENTS - Ostomy and/or Continence Assessment and Care []  - Incontinence Assessment and Management 0 []  - 0 Ostomy Care Assessment and Management (repouching, etc.) PROCESS - Coordination of Care X - Simple Patient / Family Education for ongoing care 1 15 []  - 0 Complex (extensive) Patient / Family Education for ongoing care X- 1 10 Staff obtains ChiropractorConsents, Records, Test Results / Process Orders []  - 0 Staff telephones HHA, Nursing Homes / Clarify orders / etc []  - 0 Routine Transfer to another Facility (non-emergent condition) []  - 0 Routine Hospital Admission (non-emergent condition) X- 1 15 New Admissions / Manufacturing engineernsurance Authorizations / Ordering NPWT, Apligraf, etc. []  - 0 Emergency Hospital Admission (emergent condition) PROCESS - Special Needs []  - Pediatric / Minor Patient Management 0 []  - 0 Isolation Patient Management []  - 0 Hearing / Language / Visual special needs []  - 0 Assessment of Community assistance (transportation, D/C planning, etc.) []  - 0 Additional assistance / Altered mentation []  - 0 Support Surface(s) Assessment (bed, cushion, seat, etc.) Cesar Ali, Cesar D. ([id number]) INTERVENTIONS - Miscellaneous []  -  External ear exam 0 []  - 0 Patient Transfer (multiple staff / Nurse, adultHoyer Lift / Similar devices) []  - 0 Simple Staple / Suture removal (25 or less) []  - 0 Complex Staple / Suture removal (26 or more) []  - 0 Hypo/Hyperglycemic Management (do not check if billed separately) []  - 0 Ankle /  Brachial Index (ABI) - do not check if billed separately Has the patient been seen at the hospital within the last three years: Yes Total Score: 85 Level Of Care: New/Established - Level 3 Electronic Signature(s) Signed: 05/01/2018 4:52:40 PM By: Cesar Sites Entered By: Cesar Sites on 05/01/2018 15:30:35 Cesar Acton ([id number]) -------------------------------------------------------------------------------- Encounter Discharge Information Details Patient Name: Cesar Ali. Date of Service: 05/01/2018 2:30 PM Medical Record Number: [id number] Patient Account Number: [id number] Date of Birth/Sex: 01/11/1966 (52 y.o. M) Treating RN: Cesar Sites Primary Care Obbie Lewallen: Cesar Shelter Other Clinician: Referring Raeden Schippers: Cesar Gottron Treating Kadence Mikkelson/Extender: Cesar Dibbles, Ali Weeks in Treatment: 0 Encounter Discharge Information Items Post Procedure Vitals Discharge Condition: Stable Temperature (F): 97.6 Ambulatory Status: Ambulatory Pulse (bpm): 102 Discharge Destination: Home Respiratory Rate (breaths/min): 16 Transportation: Private Auto Blood Pressure (mmHg): 109/74 Accompanied By: self Schedule Follow-up Appointment: Yes Clinical Summary of Care: Electronic Signature(s) Signed: 05/01/2018 4:42:50 PM By: Cesar Sites Entered By: Cesar Sites on 05/01/2018 16:42:50 Cesar Acton ([id number]) -------------------------------------------------------------------------------- Lower Extremity Assessment Details Patient Name: Cesar Acton. Date of Service: 05/01/2018 2:30 PM Medical Record Number: [id number] Patient Account Number: [id number] Date  of Birth/Sex: [birth year] (52 y.o. M) Treating RN: Cesar Coventry Primary Care Zayed Griffie: Cesar Shelter Other Clinician: Referring Renato Spellman: Cesar Gottron Treating Schae Cando/Extender: Cesar Dibbles, Ali Weeks in Treatment: 0 Edema Assessment Assessed: [Left: No] [Right: No] Edema: [Left: N] [Right: o] Vascular Assessment Pulses: Dorsalis Pedis Palpable: [Left:No] Doppler Audible: [Left:Yes] Posterior Tibial Palpable: [Left:No] Doppler Audible: [Left:Yes] Extremity colors, Ali growth, and conditions: Extremity Color: [Left:Hyperpigmented] Ali Growth on Extremity: [Left:No] Temperature of Extremity: [Left:Warm] Capillary Refill: [Left:< 3 seconds] Toe Nail Assessment Left: Right: Thick: No Discolored: Yes Deformed: No Improper Length and Hygiene: Yes Electronic Signature(s) Signed: 05/02/2018 5:21:06 PM By: Cesar Gurney, BSN, RN, CWS, Kim RN, BSN Entered By: Cesar Gurney, BSN, RN, CWS, Ali on 05/01/2018 15:06:06 JAIVEER, PANAS ([id number]) -------------------------------------------------------------------------------- Multi Wound Chart Details Patient Name: Cesar Acton. Date of Service: 05/01/2018 2:30 PM Medical Record Number: [id number] Patient Account Number: [id number] Date of Birth/Sex: [birth year] (52 y.o. M) Treating RN: Cesar Sites Primary Care Dhanya Bogle: Cesar Shelter Other Clinician: Referring Roe Koffman: Cesar Gottron Treating Yliana Gravois/Extender: Cesar Dibbles, Ali Weeks in Treatment: 0 Vital Signs Height(in): 72 Pulse(bpm): 102 Weight(lbs): 178 Blood Pressure(mmHg): 109/74 Body Mass Index(BMI): 24 Temperature(F): 97.6 Respiratory Rate 16 (breaths/min): Photos: [1:No Photos] [N/A:N/A] Wound Location: [1:Left Malleolus - Lateral] [N/A:N/A] Wounding Event: [1:Surgical Injury] [N/A:N/A] Primary Etiology: [1:Trauma, Other] [N/A:N/A] Secondary Etiology: [1:Diabetic Wound/Ulcer of the Lower Extremity] [N/A:N/A] Comorbid History: [1:Type I Diabetes]  [N/A:N/A] Date Acquired: [1:04/03/2018] [N/A:N/A] Weeks of Treatment: [1:0] [N/A:N/A] Wound Status: [1:Open] [N/A:N/A] Measurements L x W x D [1:4.5x0.5x0.2] [N/A:N/A] (cm) Area (cm) : [1:1.767] [N/A:N/A] Volume (cm) : [1:0.353] [N/A:N/A] Classification: [1:Full Thickness Without Exposed Support Structures] [N/A:N/A] Exudate Amount: [1:Large] [N/A:N/A] Exudate Type: [1:Serosanguineous] [N/A:N/A] Exudate Color: [1:red, brown] [N/A:N/A] Wound Margin: [1:Epibole] [N/A:N/A] Granulation Amount: [1:None Present (0%)] [N/A:N/A] Necrotic Amount: [1:Large (67-100%)] [N/A:N/A] Necrotic Tissue: [1:Eschar, Adherent Slough] [N/A:N/A] Exposed Structures: [1:Fat Layer (Subcutaneous Tissue) Exposed: Yes Fascia: No Tendon: No Muscle: No Joint: No Bone: No] [N/A:N/A] Epithelialization: [1:None] [N/A:N/A] Periwound Skin Texture: [1:Induration: Yes Scarring: Yes] [N/A:N/A] Periwound Skin Moisture: [1:No Abnormalities Noted] [N/A:N/A] Periwound Skin Color: [1:Hemosiderin Staining: Yes] [N/A:N/A] Temperature: [1:No Abnormality] [N/A:N/A] Tenderness on Palpation: [1:Yes] [N/A:N/A] Wound Preparation: Ulcer Cleansing: N/A N/A Rinsed/Irrigated with Saline Topical Anesthetic Applied: Other: lidocaine 4% Treatment Notes Electronic Signature(s) Signed: 05/01/2018 4:52:40 PM By: Cesar Sites Entered By: Cesar Sites on 05/01/2018  15:29:08 KEAWE, MARCELLO ([id number]) -------------------------------------------------------------------------------- Multi-Disciplinary Care Plan Details Patient Name: JATERRIUS, RICKETSON. Date of Service: 05/01/2018 2:30 PM Medical Record Number: [id number] Patient Account Number: [id number] Date of Birth/Sex: [birth year] (52 y.o. M) Treating RN: Cesar Sites Primary Care Rylee Nuzum: Cesar Shelter Other Clinician: Referring Chastelyn Athens: Cesar Gottron Treating Cesar Alspaugh/Extender: Cesar Dibbles, Ali Weeks in Treatment: 0 Active Inactive ` Abuse / Safety /  Falls / Self Care Management Nursing Diagnoses: Impaired home maintenance Goals: Patient will not develop complications from immobility Date Initiated: 05/01/2018 Target Resolution Date: 07/21/2018 Goal Status: Active Interventions: Assess fall risk on admission and as needed Notes: ` Orientation to the Wound Care Program Nursing Diagnoses: Knowledge deficit related to the wound healing center program Goals: Patient/caregiver will verbalize understanding of the Wound Healing Center Program Date Initiated: 05/01/2018 Target Resolution Date: 07/21/2018 Goal Status: Active Interventions: Provide education on orientation to the wound center Notes: ` Pain, Acute or Chronic Nursing Diagnoses: Pain, acute or chronic: actual or potential Goals: Patient will verbalize adequate pain control and receive pain control interventions during procedures as needed Date Initiated: 05/01/2018 Target Resolution Date: 07/21/2018 Goal Status: Active Interventions: KEENE, GILKEY ([id number]) Complete pain assessment as per visit requirements Notes: ` Wound/Skin Impairment Nursing Diagnoses: Impaired tissue integrity Goals: Ulcer/skin breakdown will heal within 14 weeks Date Initiated: 05/01/2018 Target Resolution Date: 07/21/2018 Goal Status: Active Interventions: Assess patient/caregiver ability to obtain necessary supplies Assess patient/caregiver ability to perform ulcer/skin care regimen upon admission and as needed Assess ulceration(s) every visit Notes: Electronic Signature(s) Signed: 05/01/2018 4:52:40 PM By: Cesar Sites Entered By: Cesar Sites on 05/01/2018 15:28:51 Cesar Acton ([id number]) -------------------------------------------------------------------------------- Pain Assessment Details Patient Name: Cesar Acton. Date of Service: 05/01/2018 2:30 PM Medical Record Number: [id number] Patient Account Number: [id number] Date of Birth/Sex: 04-18-1966 (52  y.o. M) Treating RN: Cesar Coventry Primary Care Jalei Shibley: Cesar Shelter Other Clinician: Referring Artavia Jeanlouis: Cesar Gottron Treating Thos Matsumoto/Extender: Cesar Dibbles, Ali Weeks in Treatment: 0 Active Problems Location of Pain Severity and Description of Pain Patient Has Paino No Site Locations With Dressing Change: No Pain Management and Medication Current Pain Management: Electronic Signature(s) Signed: 05/02/2018 5:21:06 PM By: Cesar Gurney, BSN, RN, CWS, Kim RN, BSN Entered By: Cesar Gurney, BSN, RN, CWS, Ali on 05/01/2018 14:42:07 Cesar Acton ([id number]) -------------------------------------------------------------------------------- Patient/Caregiver Education Details Patient Name: CANNON, ARREOLA. Date of Service: 05/01/2018 2:30 PM Medical Record Number: [id number] Patient Account Number: [id number] Date of Birth/Gender: 10-Feb-1966 (52 y.o. M) Treating RN: Cesar Sites Primary Care Physician: Cesar Shelter Other Clinician: Referring Physician: Ruben Gottron Treating Physician/Extender: Skeet Simmer in Treatment: 0 Education Assessment Education Provided To: Patient Education Topics Provided Wound/Skin Impairment: Handouts: Other: wound care asordered Methods: Demonstration, Explain/Verbal Responses: State content correctly Electronic Signature(s) Signed: 05/01/2018 4:52:40 PM By: Cesar Sites Entered By: Cesar Sites on 05/01/2018 15:30:54 Cesar Acton ([id number]) -------------------------------------------------------------------------------- Wound Assessment Details Patient Name: Cesar Acton. Date of Service: 05/01/2018 2:30 PM Medical Record Number: [id number] Patient Account Number: [id number] Date of Birth/Sex: 06-Apr-1966 (52 y.o. M) Treating RN: Cesar Coventry Primary Care Ryiah Bellissimo: Cesar Shelter Other Clinician: Referring Natallia Stellmach: Cesar Gottron Treating Vuk Skillern/Extender: Cesar Dibbles, Ali Weeks in  Treatment: 0 Wound Status Wound Number: 1 Primary Etiology: Trauma, Other Wound Location: Left Malleolus - Lateral Secondary Diabetic Wound/Ulcer of the Lower Etiology: Extremity Wounding Event: Surgical Injury Wound Status: Open Date Acquired: 04/03/2018 Comorbid History: Type I Diabetes Weeks Of Treatment: 0 Clustered Wound: No Wound Measurements Length: (cm) 4.5 Width: (cm) 0.5 Depth: (  cm) 0.2 Area: (cm) 1.767 Volume: (cm) 0.353 % Reduction in Area: % Reduction in Volume: Epithelialization: None Tunneling: No Undermining: No Wound Description Full Thickness Without Exposed Support Classification: Structures Wound Margin: Epibole Exudate Large Amount: Exudate Type: Serosanguineous Exudate Color: red, brown Foul Odor After Cleansing: No Slough/Fibrino Yes Wound Bed Granulation Amount: None Present (0%) Exposed Structure Necrotic Amount: Large (67-100%) Fascia Exposed: No Necrotic Quality: Eschar, Adherent Slough Fat Layer (Subcutaneous Tissue) Exposed: Yes Tendon Exposed: No Muscle Exposed: No Joint Exposed: No Bone Exposed: No Periwound Skin Texture Texture Color No Abnormalities Noted: No No Abnormalities Noted: No Induration: Yes Hemosiderin Staining: Yes Scarring: Yes Temperature / Pain Moisture Temperature: No Abnormality No Abnormalities Noted: No Tenderness on Palpation: Yes Wound Preparation Ulcer Cleansing: Rinsed/Irrigated with Saline Topical Anesthetic Applied: Other: lidocaine 4%, CARZELL, SALDIVAR ([id number]) Electronic Signature(s) Signed: 05/02/2018 5:21:06 PM By: Cesar Gurney, BSN, RN, CWS, Kim RN, BSN Entered By: Cesar Gurney, BSN, RN, CWS, Ali on 05/01/2018 15:02:06 FRENCHIE, DANGERFIELD ([id number]) -------------------------------------------------------------------------------- Vitals Details Patient Name: JAXIN, FULFER. Date of Service: 05/01/2018 2:30 PM Medical Record Number: [id number] Patient Account Number: [id number] Date of  Birth/Sex: 1965-08-10 (52 y.o. M) Treating RN: Cesar Coventry Primary Care Miguelangel Korn: Cesar Shelter Other Clinician: Referring Kynli Chou: Cesar Gottron Treating Abryana Lykens/Extender: Cesar Dibbles, Ali Weeks in Treatment: 0 Vital Signs Time Taken: 14:42 Temperature (F): 97.6 Height (in): 72 Pulse (bpm): 102 Weight (lbs): 178 Respiratory Rate (breaths/min): 16 Body Mass Index (BMI): 24.1 Blood Pressure (mmHg): 109/74 Reference Range: 80 - 120 mg / dl Electronic Signature(s) Signed: 05/02/2018 5:21:06 PM By: Cesar Gurney, BSN, RN, CWS, Kim RN, BSN Entered By: Cesar Gurney, BSN, RN, CWS, Ali on 05/01/2018 14:42:35

## 2018-05-04 NOTE — Progress Notes (Signed)
JIE, STICKELS ([id number]) Visit Report for 05/01/2018 Chief Complaint Document Details Patient Name: Cesar Ali, Cesar Ali. Date of Service: 05/01/2018 2:30 PM Medical Record Number: [id number] Patient Account Number: [id number] Date of Birth/Sex: Feb 27, 1966 (52 y.o. M) Treating RN: Curtis Sites Primary Care Provider: Corine Shelter Other Clinician: Referring Provider: Ruben Gottron Treating Provider/Extender: Linwood Dibbles, HOYT Weeks in Treatment: 0 Information Obtained from: Patient Chief Complaint Left ankle surgical ulcer Electronic Signature(s) Signed: 05/01/2018 5:49:35 PM By: Lenda Kelp PA-C Entered By: Lenda Kelp on 05/01/2018 17:36:59 TRESTIN, VENCES ([id number]) -------------------------------------------------------------------------------- Debridement Details Patient Name: Cesar Ali. Date of Service: 05/01/2018 2:30 PM Medical Record Number: [id number] Patient Account Number: [id number] Date of Birth/Sex: 06-17-1965 (52 y.o. M) Treating RN: Curtis Sites Primary Care Provider: Corine Shelter Other Clinician: Referring Provider: Ruben Gottron Treating Provider/Extender: Linwood Dibbles, HOYT Weeks in Treatment: 0 Debridement Performed for Wound #1 Left,Lateral Malleolus Assessment: Performed By: Physician STONE III, HOYT E., PA-C Debridement Type: Debridement Severity of Tissue Pre Fat layer exposed Debridement: Level of Consciousness (Pre- Awake and Alert procedure): Pre-procedure Verification/Time Yes - 15:27 Out Taken: Start Time: 15:27 Pain Control: Lidocaine 4% Topical Solution Total Area Debrided (L x W): 4.5 (cm) x 0.5 (cm) = 2.25 (cm) Tissue and other material Viable, Non-Viable, Slough, Subcutaneous, Fibrin/Exudate, Slough debrided: Level: Skin/Subcutaneous Tissue Debridement Description: Excisional Instrument: Curette Bleeding: Minimum Hemostasis Achieved: Pressure End Time: 15:29 Procedural Pain:  0 Post Procedural Pain: 0 Response to Treatment: Procedure was tolerated well Level of Consciousness Awake and Alert (Post-procedure): Post Debridement Measurements of Total Wound Length: (cm) 4.5 Width: (cm) 0.5 Depth: (cm) 0.3 Volume: (cm) 0.53 Character of Wound/Ulcer Post Debridement: Improved Severity of Tissue Post Debridement: Fat layer exposed Post Procedure Diagnosis Same as Pre-procedure Electronic Signature(s) Signed: 05/01/2018 4:52:40 PM By: Curtis Sites Signed: 05/01/2018 5:49:35 PM By: Lenda Kelp PA-C Entered By: Curtis Sites on 05/01/2018 15:30:19 Cesar Ali ([id number]) -------------------------------------------------------------------------------- HPI Details Patient Name: Cesar Ali. Date of Service: 05/01/2018 2:30 PM Medical Record Number: [id number] Patient Account Number: [id number] Date of Birth/Sex: [birth year] (52 y.o. M) Treating RN: Curtis Sites Primary Care Provider: Corine Shelter Other Clinician: Referring Provider: Ruben Gottron Treating Provider/Extender: Linwood Dibbles, HOYT Weeks in Treatment: 0 History of Present Illness HPI Description: 05/01/18 on evaluation today patient is actually seen at the request of [facility] concerning a nonhealing wound of the left lateral malleolus status post open reduction internal fixation of the left ankle on 04/03/18 the surgery was necessitated by an injury which occurred on 04/03/18 when he failed at his home. He states he was out in the yard he did not trip but subsequently did injure his ankle badly. He does have diabetes mellitus type I but she's had since 52 years old. He also has stage III kidney failure. The patient had four strokes in 2010 the fortunately he does not have severe residual weakness he does so to some general weakness. His sutures were actually remove yesterday by Dr. Eulah Pont. Subsequently there were two concerns that were noted  during that visit on 04/25/18. One was that there was a risk of infection which she was started on antibiotics for although I do not see documentation with that antibiotic regimen was. The patient is unsure. Secondly it does appear that at that visit the sutures were actually left in for one more week although they were actually removed yesterday which is when the physician assistant [facility] contacted me to see whether or  not we would be able to evaluate the patient and see if we can help in some regard with this. Subsequently the patient does seem to have some Slough and tissue breakdown in the central portion of the wound. I did actually probe around prior to debridement and noted that there was some undermining especially on the anterior portion of the wound edge. No fevers, chills, nausea, or vomiting noted at this time. Of note we did receive documentation from the orthopedic clinic releasing the patient during the postop surgical. To [place] for evaluation and treatment of the wound signed by the provider. Electronic Signature(s) Signed: 05/01/2018 5:49:35 PM By: Lenda Kelp PA-C Entered By: Lenda Kelp on 05/01/2018 17:43:48 DESIDERIO, DOLATA ([id number]) -------------------------------------------------------------------------------- Physical Exam Details Patient Name: Cesar, Ali. Date of Service: 05/01/2018 2:30 PM Medical Record Number: [id number] Patient Account Number: [id number] Date of Birth/Sex: [birth year] (52 y.o. M) Treating RN: Curtis Sites Primary Care Provider: Cor[facility] Shelter Other Clinician: Referring Provider: Ruben Gottron Treating Provider/Extender: Linwood Dibbles, HOYT Weeks in Treatment: 0 Constitutional sitting or standing blood pressure is within target range for patient.. pulse regular and within target range for patient.Marland Kitchen respirations regular, non-labored and within target range for patient.Marland Kitchen temperature within target range for patient..  Thin and well-hydrated in no acute distress. Eyes conjunctiva clear no eyelid edema noted. pupils equal round and reactive to light and accommodation. Ears, Nose, Mouth, and Throat no gross abnormality of ear auricles or external auditory canals. normal hearing noted during conversation. mucus membranes moist. Respiratory normal breathing without difficulty. clear to auscultation bilaterally. Cardiovascular regular rate and rhythm with normal S1, S2. Absent posterior tibial and dorsalis pedis pulses bilateral lower extremities. no clubbing, cyanosis, significant edema, <3 sec cap refill. Gastrointestinal (GI) soft, non-tender, non-distended, +BS. no ventral hernia noted. Musculoskeletal normal gait and posture. no significant deformity or arthritic changes, no loss or range of motion, no clubbing. Psychiatric this patient is able to make decisions and demonstrates good insight into disease process. Alert and Oriented x 3. pleasant and cooperative. Notes Patient's wound bed currently again shows sutures to be completely removed at this point. Unfortunately there does appear to be some dehiscence of the surgical wound and there does appear to be necrotic tissue noted at this point. He does have some tenderness we applied lidocaine to the area prior to debridement and then I did debride the wound today to remove the necrotic material from the central portion of the wound. The patient tolerated this without complication. Post debridement the wound bed does appear to be quite a bit deeper than what I previously noted upon initial inspection. Nonetheless I do believe that with appropriate dressings this is going to be something that definitely has a chance to heal and fill in although the other thing that I would like to evaluate the patient for is sufficient arterial flow. The overall appearance of his extremities the warm does appear to be on the poor side as far as blood flow is concerned. He  has not had formal arterial studies at this time. Electronic Signature(s) Signed: 05/01/2018 5:49:35 PM By: Lenda Kelp PA-C Entered By: Lenda Kelp on 05/01/2018 17:46:05 Cesar Ali ([id number]) -------------------------------------------------------------------------------- Physician Orders Details Patient Name: DERRELL, MILANES. Date of Service: 05/01/2018 2:30 PM Medical Record Number: [id number] Patient Account Number: [id number] Date of Birth/Sex: 02-03-1966 (52 y.o. M) Treating RN: Curtis Sites Primary Care Provider: Cor[facility] Shelter Other Clinician: Referring Provider: Ruben Gottron Treating Provider/Extender: Linwood Dibbles,  HOYT Weeks in Treatment: 0 Verbal / Phone Orders: No Diagnosis Coding Wound Cleansing Wound #1 Left,Lateral Malleolus o Clean wound with Normal Saline. o Cleanse wound with mild soap and water o May Shower, gently pat wound dry prior to applying new dressing. Anesthetic (add to Medication List) Wound #1 Left,Lateral Malleolus o Topical Lidocaine 4% cream applied to wound bed prior to debridement (In Clinic Only). Skin Barriers/Peri-Wound Care Wound #1 Left,Lateral Malleolus o Moisturizing lotion - Please use generic Eucerin Lotion mixed half and half with triamcinolone cream and apply this to the dry skin on your leg surrounding the wound but not under the bandage o Triamcinolone Acetonide Ointment (TCA) Primary Wound Dressing Wound #1 Left,Lateral Malleolus o Silver Alginate Secondary Dressing Wound #1 Left,Lateral Malleolus o Tegaderm o Drawtex Dressing Change Frequency Wound #1 Left,Lateral Malleolus o Change dressing every other day. Follow-up Appointments Wound #1 Left,Lateral Malleolus o Return Appointment in 1 week. Edema Control Wound #1 Left,Lateral Malleolus o Elevate legs to the level of the heart and pump ankles as often as possible Off-Loading Wound #1 Left,Lateral Malleolus o  Other: - continue wearing your walking boot Cesar ActonMCALLISTER, Jaksen D. ([id number]) Home Health Wound #1 Left,Lateral Malleolus o Initiate Home Health for Skilled Nursing o Home Health Nurse may visit PRN to address patientos wound care needs. o FACE TO FACE ENCOUNTER: MEDICARE and MEDICAID PATIENTS: I certify that this patient is under my care and that I had a face-to-face encounter that meets the physician face-to-face encounter requirements with this patient on this date. The encounter with the patient was in whole or in part for the following MEDICAL CONDITION: (primary reason for Home Healthcare) MEDICAL NECESSITY: I certify, that based on my findings, NURSING services are a medically necessary home health service. HOME BOUND STATUS: I certify that my clinical findings support that this patient is homebound (i.e., Due to illness or injury, pt requires aid of supportive devices such as crutches, cane, wheelchairs, walkers, the use of special transportation or the assistance of another person to leave their place of residence. There is a normal inability to leave the home and doing so requires considerable and taxing effort. Other absences are for medical reasons / religious services and are infrequent or of short duration when for other reasons). o If current dressing causes regression in wound condition, may D/C ordered dressing product/s and apply Normal Saline Moist Dressing daily until next Wound Healing Center / Other MD appointment. Notify Wound Healing Center of regression in wound condition at [phone]. o Please direct any NON-WOUND related issues/requests for orders to patient's Primary Care Physician Services and Therapies o Arterial Studies- Bilateral Patient Medications Allergies: No Known Allergies Notifications Medication Indication Start End triamcinolone acetonide 05/01/2018 DOSE topical 0.1 % cream - cream topical mixed Half and half with OTC Eucerin cream  (or generic alternative) and applied in a thin film 2 times a day to the left leg Electronic Signature(s) Signed: 05/01/2018 3:42:55 PM By: Lenda KelpStone III, Hoyt PA-C Entered By: Lenda KelpStone III, Hoyt on 05/01/2018 15:42:55 Cesar ActonMCALLISTER, Marsalis D. ([id number]) -------------------------------------------------------------------------------- Problem List Details Patient Name: Cesar ActonMCALLISTER, Adell D. Date of Service: 05/01/2018 2:30 PM Medical Record Number: [id number] Patient Account Number: [id number] Date of Birth/Sex: [birth year] (52 y.o. 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 Date of Service: 05/01/2018 2:30 PM Medical Record Number: [id number] Patient Account Number: [id number] Date of Birth/Sex: 12-Mar-1966 (52 y.o. M) Treating RN: Curtis Sites Primary Care Provider: Corine Shelter Other Clinician: Referring Provider: Ruben Gottron Treating Provider/Extender: Linwood Dibbles, HOYT Weeks in Treatment: 0 Subjective Chief Complaint Information obtained from Patient Left  ankle surgical ulcer History of Present Illness (HPI) 05/01/18 on evaluation today patient is actually seen at the request of [facility] concerning a nonhealing wound of the left lateral malleolus status post open reduction internal fixation of the left ankle on 04/03/18 the surgery was necessitated by an injury which occurred on 04/03/18 when he failed at his home. He states he was out in the yard he did not trip but subsequently did injure his ankle badly. He does have diabetes mellitus type I but she's had since 52 years old. He also has stage III kidney failure. The patient had four strokes in 2010 the fortunately he does not have severe residual weakness he does so to some general weakness. His sutures were actually remove yesterday by Dr. Eulah Pont. Subsequently there were two concerns that were noted during that visit on 04/25/18. One was that there was a risk of infection which she was started on antibiotics for although I do not see documentation with that antibiotic regimen was. The patient is unsure. Secondly it does appear that at that visit the sutures were actually left in for one more week although they were actually removed yesterday which is when the physician assistant [facility] contacted me to see whether or not we would be able to evaluate the patient and see if we can help in some regard with this. Subsequently the patient does seem to have some Slough and tissue breakdown in the central portion of the wound. I did actually probe around prior to debridement and noted that there was some undermining especially on the anterior portion of the wound edge. No fevers, chills, nausea, or vomiting noted at this time. Of note we did receive documentation from the orthopedic clinic releasing the patient during the postop surgical. To [place] for evaluation and treatment of the wound signed by the provider. Wound History Patient presents with 1 open wound that has  been present for approximately 4 weeks. Patient has been treating wound in the following manner: outter dressing. Laboratory tests have not been performed in the last month. Patient reportedly has not tested positive for an antibiotic resistant organism. Patient reportedly has not tested positive for osteomyelitis. Patient reportedly has not had testing performed to evaluate circulation in the legs. Patient History Information obtained from Patient. Allergies No Known Allergies Family History Cancer - Siblings, Diabetes - Mother,Maternal Grandparents, Heart Disease - Maternal Grandparents, Hypertension - Maternal Grandparents, No family history of Kidney Disease, Lung Disease, Seizures, Stroke, Thyroid Problems, Tuberculosis. Social History Former smoker, Marital Status - Single, Alcohol Use - Rarely, Drug Use - Prior History, Caffeine Use - Daily. Medical History IBN, STIEF ([id number]) Eyes Denies history of Cataracts, Glaucoma, Optic Neuritis Hematologic/Lymphatic Denies history of Anemia,  Hemophilia, Human Immunodeficiency Virus, Lymphedema, Sickle Cell Disease Respiratory Denies history of Aspiration, Asthma, Chronic Obstructive Pulmonary Disease (COPD), Pneumothorax, Sleep Apnea, Tuberculosis Cardiovascular Denies history of Angina, Arrhythmia, Congestive Heart Failure, Coronary Artery Disease, Deep Vein Thrombosis, Hypertension, Hypotension, Myocardial Infarction, Peripheral Arterial Disease, Peripheral Venous Disease, Phlebitis, Vasculitis Gastrointestinal Denies history of Cirrhosis , Colitis, Crohn s, Hepatitis A, Hepatitis B, Hepatitis C Endocrine Patient has history of Type I Diabetes - since 14 Denies history of Type II Diabetes Genitourinary Denies history of End Stage Renal Disease Immunological Denies history of Lupus Erythematosus, Raynaud s, Scleroderma Integumentary (Skin) Denies history of History of Burn, History of pressure  wounds Musculoskeletal Denies history of Gout, Rheumatoid Arthritis, Osteoarthritis, Osteomyelitis Neurologic Denies history of Dementia, Neuropathy, Quadriplegia, Paraplegia, Seizure Disorder Oncologic Denies history of Received Chemotherapy, Received Radiation Psychiatric Denies history of Anorexia/bulimia, Confinement Anxiety Patient is treated with Insulin. Blood sugar is not tested. Hospitalization/Surgery History - 04/03/2018, [facility], Surgery. Medical And Surgical History Notes Constitutional Symptoms (General Health) 4 strokes in 2010 Review of Systems (ROS) Constitutional Symptoms (General Health) The patient has no complaints or symptoms. Eyes The patient has no complaints or symptoms. Ear/Nose/Mouth/Throat The patient has no complaints or symptoms. Hematologic/Lymphatic The patient has no complaints or symptoms. Respiratory The patient has no complaints or symptoms. Cardiovascular The patient has no complaints or symptoms. Gastrointestinal The patient has no complaints or symptoms. Endocrine Denies complaints or symptoms of Hepatitis, Thyroid disease, Polydypsia (Excessive Thirst). Genitourinary Complains or has symptoms of Kidney failure/ Dialysis - Stage III. Immunological The patient has no complaints or symptoms. LEELYN, JASINSKI ([id number]) Integumentary (Skin) Complains or has symptoms of Wounds, Bleeding or bruising tendency. Denies complaints or symptoms of Breakdown, Swelling. Musculoskeletal The patient has no complaints or symptoms. Neurologic The patient has no complaints or symptoms. Oncologic The patient has no complaints or symptoms. Psychiatric The patient has no complaints or symptoms. Objective Constitutional sitting or standing blood pressure is within target range for patient.. pulse regular and within target range for patient.Marland Kitchen respirations regular, non-labored and within target range for patient.Marland Kitchen temperature within target  range for patient.. Thin and well-hydrated in no acute distress. Vitals Time Taken: 2:42 PM, Height: 72 in, Weight: 178 lbs, BMI: 24.1, Temperature: 97.6 F, Pulse: 102 bpm, Respiratory Rate: 16 breaths/min, Blood Pressure: 109/74 mmHg. Eyes conjunctiva clear no eyelid edema noted. pupils equal round and reactive to light and accommodation. Ears, Nose, Mouth, and Throat no gross abnormality of ear auricles or external auditory canals. normal hearing noted during conversation. mucus membranes moist. Respiratory normal breathing without difficulty. clear to auscultation bilaterally. Cardiovascular regular rate and rhythm with normal S1, S2. Absent posterior tibial and dorsalis pedis pulses bilateral lower extremities. no clubbing, cyanosis, significant edema, Gastrointestinal (GI) soft, non-tender, non-distended, +BS. no ventral hernia noted. Musculoskeletal normal gait and posture. no significant deformity or arthritic changes, no loss or range of motion, no clubbing. Psychiatric this patient is able to make decisions and demonstrates good insight into disease process. Alert and Oriented x 3. pleasant and cooperative. General Notes: Patient's wound bed currently again shows sutures to be completely removed at this point. Unfortunately there does appear to be some dehiscence of the surgical wound and there does appear to be necrotic tissue noted at this point. He does have some tenderness we applied lidocaine to the area prior to debridement and then I did debride the wound today to remove the necrotic material from the central portion of the wound. The patient tolerated this without  complication. Post MAKYI, LEDO ([id number]) debridement the wound bed does appear to be quite a bit deeper than what I previously noted upon initial inspection. Nonetheless I do believe that with appropriate dressings this is going to be something that definitely has a chance to heal and fill in  although the other thing that I would like to evaluate the patient for is sufficient arterial flow. The overall appearance of his extremities the warm does appear to be on the poor side as far as blood flow is concerned. He has not had formal arterial studies at this time. Integumentary (Hair, Skin) Wound #1 status is Open. Original cause of wound was Surgical Injury. The wound is located on the Left,Lateral Malleolus. The wound measures 4.5cm length x 0.5cm width x 0.2cm depth; 1.767cm^2 area and 0.353cm^3 volume. There is Fat Layer (Subcutaneous Tissue) Exposed exposed. There is no tunneling or undermining noted. There is a large amount of serosanguineous drainage noted. The wound margin is epibole. There is no granulation within the wound bed. There is a large (67-100%) amount of necrotic tissue within the wound bed including Eschar and Adherent Slough. The periwound skin appearance exhibited: Induration, Scarring, Hemosiderin Staining. Periwound temperature was noted as No Abnormality. The periwound has tenderness on palpation. Assessment Active Problems ICD-10 Disruption of external operation (surgical) wound, not elsewhere classified, initial encounter Non-pressure chronic ulcer of left ankle with fat layer exposed Type 1 diabetes mellitus with other skin ulcer Chronic kidney disease, stage 3 (moderate) Procedures Wound #1 Pre-procedure diagnosis of Wound #1 is a Trauma, Other located on the Left,Lateral Malleolus .Severity of Tissue Pre Debridement is: Fat layer exposed. There was a Excisional Skin/Subcutaneous Tissue Debridement with a total area of 2.25 sq cm performed by STONE III, HOYT E., PA-C. With the following instrument(s): Curette to remove Viable and Non-Viable tissue/material. Material removed includes Subcutaneous Tissue, Slough, and Fibrin/Exudate after achieving pain control using Lidocaine 4% Topical Solution. No specimens were taken. A time out was conducted at 15:27,  prior to the start of the procedure. A Minimum amount of bleeding was controlled with Pressure. The procedure was tolerated well with a pain level of 0 throughout and a pain level of 0 following the procedure. Post Debridement Measurements: 4.5cm length x 0.5cm width x 0.3cm depth; 0.53cm^3 volume. Character of Wound/Ulcer Post Debridement is improved. Severity of Tissue Post Debridement is: Fat layer exposed. Post procedure Diagnosis Wound #1: Same as Pre-Procedure Plan Wound Cleansing: Wound #1 Left,Lateral Malleolus: Clean wound with Normal Saline. Cleanse wound with mild soap and water NIEKO, CLARIN ([id number]) May Shower, gently pat wound dry prior to applying new dressing. Anesthetic (add to Medication List): Wound #1 Left,Lateral Malleolus: Topical Lidocaine 4% cream applied to wound bed prior to debridement (In Clinic Only). Skin Barriers/Peri-Wound Care: Wound #1 Left,Lateral Malleolus: Moisturizing lotion - Please use generic Eucerin Lotion mixed half and half with triamcinolone cream and apply this to the dry skin on your leg surrounding the wound but not under the bandage Triamcinolone Acetonide Ointment (TCA) Primary Wound Dressing: Wound #1 Left,Lateral Malleolus: Silver Alginate Secondary Dressing: Wound #1 Left,Lateral Malleolus: Tegaderm Drawtex Dressing Change Frequency: Wound #1 Left,Lateral Malleolus: Change dressing every other day. Follow-up Appointments: Wound #1 Left,Lateral Malleolus: Return Appointment in 1 week. Edema Control: Wound #1 Left,Lateral Malleolus: Elevate legs to the level of the heart and pump ankles as often as possible Off-Loading: Wound #1 Left,Lateral Malleolus: Other: - continue wearing your walking boot Home Health: Wound #1 Left,Lateral Malleolus: Initiate Home  Health for Skilled Nursing Home Health Nurse may visit PRN to address patient s wound care needs. FACE TO FACE ENCOUNTER: MEDICARE and MEDICAID PATIENTS: I  certify that this patient is under my care and that I had a face-to-face encounter that meets the physician face-to-face encounter requirements with this patient on this date. The encounter with the patient was in whole or in part for the following MEDICAL CONDITION: (primary reason for Home Healthcare) MEDICAL NECESSITY: I certify, that based on my findings, NURSING services are a medically necessary home health service. HOME BOUND STATUS: I certify that my clinical findings support that this patient is homebound (i.e., Due to illness or injury, pt requires aid of supportive devices such as crutches, cane, wheelchairs, walkers, the use of special transportation or the assistance of another person to leave their place of residence. There is a normal inability to leave the home and doing so requires considerable and taxing effort. Other absences are for medical reasons / religious services and are infrequent or of short duration when for other reasons). If current dressing causes regression in wound condition, may D/C ordered dressing product/s and apply Normal Saline Moist Dressing daily until next Wound Healing Center / Other MD appointment. Notify Wound Healing Center of regression in wound condition at [phone]. Please direct any NON-WOUND related issues/requests for orders to patient's Primary Care Physician Services and Therapies ordered were: Arterial Studies- Bilateral The following medication(s) was prescribed: triamcinolone acetonide topical 0.1 % cream cream topical mixed Half and half with OTC Eucerin cream (or generic alternative) and applied in a thin film 2 times a day to the left leg starting 05/01/2018 My suggestion at this point was twofold. First and foremost in regard to the wound we are gonna pack this with a silver alginate dressing followed by Drawtex and then cover it with a tegaderm dressing. This will help protect and take care of the wound as far as that's concerned.  Subsequently the other thing that I'm going to recommend is that we go ahead and initiate treatment for the surrounding skin which is somewhat dry with a combination of Eucerin Cream mixed half-and-half with triamcinolone cream. This will hopefully help to alleviate some of the skin irritation and moisturize the area. In the meantime we will also refer the patient for arterial testing of the bilateral lower extremities to evaluate for appropriate blood flow to KRIST, ROSENBOOM ([id number]) ensure that he has the ability to heal the wound appropriately. The patient is in agreement with all of this. We will subsequently see him back for reevaluation in one week to see were things stand he will be seeing Dr. Eulah Pont following my evaluation with him next week. Please see above for specific wound care orders. We will see patient for re-evaluation in 1 week(s) here in the clinic. If anything worsens or changes patient will contact our office for additional recommendations. Electronic Signature(s) Signed: 05/01/2018 5:49:35 PM By: Lenda Kelp PA-C Entered By: Lenda Kelp on 05/01/2018 17:47:26 Drystan, Reader Beulah Gandy ([id number]) -------------------------------------------------------------------------------- ROS/PFSH Details Patient Name: NNAEMEKA, SAMSON. Date of Service: 05/01/2018 2:30 PM Medical Record Number: [id number] Patient Account Number: [id number] Date of Birth/Sex: May 26, 1966 (52 y.o. M) Treating RN: Huel Coventry Primary Care Provider: Corine Shelter Other Clinician: Referring Provider: Ruben Gottron Treating Provider/Extender: Linwood Dibbles, HOYT Weeks in Treatment: 0 Information Obtained From Patient Wound History Do you currently have one or more open woundso Yes How many open wounds do you currently haveo 1 Approximately how  long have you had your woundso 4 weeks How have you been treating your wound(s) until nowo outter dressing Has your wound(s) ever healed and  then re-openedo No Have you had any lab work done in the past montho No Have you tested positive for an antibiotic resistant organism (MRSA, VRE)o No Have you tested positive for osteomyelitis (bone infection)o No Have you had any tests for circulation on your legso No Endocrine Complaints and Symptoms: Negative for: Hepatitis; Thyroid disease; Polydypsia (Excessive Thirst) Medical History: Positive for: Type I Diabetes - since 14 Negative for: Type II Diabetes Time with diabetes: 52 years old Treated with: Insulin Blood sugar tested every day: No Genitourinary Complaints and Symptoms: Positive for: Kidney failure/ Dialysis - Stage III Medical History: Negative for: End Stage Renal Disease Integumentary (Skin) Complaints and Symptoms: Positive for: Wounds; Bleeding or bruising tendency Negative for: Breakdown; Swelling Medical History: Negative for: History of Burn; History of pressure wounds Constitutional Symptoms (General Health) Complaints and Symptoms: No Complaints or Symptoms Medical History: Past Medical History NotesMarland Kitchen HADDON, FYFE ([id number]) 4 strokes in 2010 Eyes Complaints and Symptoms: No Complaints or Symptoms Medical History: Negative for: Cataracts; Glaucoma; Optic Neuritis Ear/Nose/Mouth/Throat Complaints and Symptoms: No Complaints or Symptoms Hematologic/Lymphatic Complaints and Symptoms: No Complaints or Symptoms Medical History: Negative for: Anemia; Hemophilia; Human Immunodeficiency Virus; Lymphedema; Sickle Cell Disease Respiratory Complaints and Symptoms: No Complaints or Symptoms Medical History: Negative for: Aspiration; Asthma; Chronic Obstructive Pulmonary Disease (COPD); Pneumothorax; Sleep Apnea; Tuberculosis Cardiovascular Complaints and Symptoms: No Complaints or Symptoms Medical History: Negative for: Angina; Arrhythmia; Congestive Heart Failure; Coronary Artery Disease; Deep Vein Thrombosis; Hypertension; Hypotension;  Myocardial Infarction; Peripheral Arterial Disease; Peripheral Venous Disease; Phlebitis; Vasculitis Gastrointestinal Complaints and Symptoms: No Complaints or Symptoms Medical History: Negative for: Cirrhosis ; Colitis; Crohnos; Hepatitis A; Hepatitis B; Hepatitis C Immunological Complaints and Symptoms: No Complaints or Symptoms Medical History: Negative for: Lupus Erythematosus; Raynaudos; Scleroderma Musculoskeletal WINNIE, BARSKY ([id number]) Complaints and Symptoms: No Complaints or Symptoms Medical History: Negative for: Gout; Rheumatoid Arthritis; Osteoarthritis; Osteomyelitis Neurologic Complaints and Symptoms: No Complaints or Symptoms Medical History: Negative for: Dementia; Neuropathy; Quadriplegia; Paraplegia; Seizure Disorder Oncologic Complaints and Symptoms: No Complaints or Symptoms Medical History: Negative for: Received Chemotherapy; Received Radiation Psychiatric Complaints and Symptoms: No Complaints or Symptoms Medical History: Negative for: Anorexia/bulimia; Confinement Anxiety Immunizations Pneumococcal Vaccine: Received Pneumococcal Vaccination: No Implantable Devices Hospitalization / Surgery History Name of Hospital Purpose of Hospitalization/Surgery Date [facility] 04/03/2018 Family and Social History Cancer: Yes - Siblings; Diabetes: Yes - Mother,Maternal Grandparents; Heart Disease: Yes - Maternal Grandparents; Hypertension: Yes - Maternal Grandparents; Kidney Disease: No; Lung Disease: No; Seizures: No; Stroke: No; Thyroid Problems: No; Tuberculosis: No; Former smoker; Marital Status - Single; Alcohol Use: Rarely; Drug Use: Prior History; Caffeine Use: Daily; Advanced Directives: No; Patient does not want information on Advanced Directives; Do not resuscitate: No; Living Will: No; Medical Power of Attorney: No Electronic Signature(s) Signed: 05/01/2018 5:49:35 PM By: Lenda Kelp PA-C Signed: 05/02/2018 5:21:06 PM By:  Elliot Gurney, BSN, RN, CWS, Kim RN, BSN Entered By: Elliot Gurney, BSN, RN, CWS, Kim on 05/01/2018 14:50:33 CLARON, ROSENCRANS ([id number]) -------------------------------------------------------------------------------- SuperBill Details Patient Name: KIMANI, HOVIS. Date of Service: 05/01/2018 Medical Record Number: [id number] Patient Account Number: [id number] Date of Birth/Sex: 1966-04-24 (52 y.o. M) Treating RN: Curtis Sites Primary Care Provider: Corine Shelter Other Clinician: Referring Provider: Ruben Gottron Treating Provider/Extender: Linwood Dibbles, HOYT Weeks in Treatment: 0 Diagnosis Coding ICD-10 Codes Code Description T81.31XA Disruption of  external operation (surgical) wound, not elsewhere classified, initial encounter L97.322 Non-pressure chronic ulcer of left ankle with fat layer exposed E10.622 Type 1 diabetes mellitus with other skin ulcer N18.3 Chronic kidney disease, stage 3 (moderate) Facility Procedures CPT4 Code: 16109604 Description: 99213 - WOUND CARE VISIT-LEV 3 EST PT Modifier: Quantity: 1 CPT4 Code: 54098119 Description: 11042 - DEB SUBQ TISSUE 20 SQ CM/< ICD-10 Diagnosis Description L97.322 Non-pressure chronic ulcer of left ankle with fat layer expose Modifier: d Quantity: 1 Physician Procedures CPT4: Description Modifier Quantity Code [id number] 99214 - WC PHYS LEVEL 4 - EST PT 25 1 ICD-10 Diagnosis Description T81.31XA Disruption of external operation (surgical) wound, not elsewhere classified, initial encounter L97.322 Non-pressure chronic ulcer  of left ankle with fat layer exposed E10.622 Type 1 diabetes mellitus with other skin ulcer N18.3 Chronic kidney disease, stage 3 (moderate) CPT4: 6213086 11042 - WC PHYS SUBQ TISS 20 SQ CM 1 ICD-10 Diagnosis Description L97.322 Non-pressure chronic ulcer of left ankle with fat layer exposed Electronic Signature(s) Signed: 05/01/2018 5:49:35 PM By: Lenda Kelp PA-C Entered By: Lenda Kelp on 05/01/2018  17:47:45

## 2018-05-08 ENCOUNTER — Encounter: Payer: Medicaid Other | Admitting: Family Medicine

## 2018-05-08 DIAGNOSIS — T8131XA Disruption of external operation (surgical) wound, not elsewhere classified, initial encounter: Secondary | ICD-10-CM | POA: Diagnosis not present

## 2018-05-09 ENCOUNTER — Other Ambulatory Visit: Payer: Self-pay | Admitting: Physician Assistant

## 2018-05-09 DIAGNOSIS — S81809A Unspecified open wound, unspecified lower leg, initial encounter: Secondary | ICD-10-CM

## 2018-05-09 DIAGNOSIS — I739 Peripheral vascular disease, unspecified: Secondary | ICD-10-CM

## 2018-05-14 NOTE — Progress Notes (Addendum)
Cesar Ali, Cesar D. ([id number]) Visit Report for 05/08/2018 Arrival Information Details Patient Name: Cesar Ali, Cesar D. Date of Service: 05/08/2018 1:15 PM Medical Record Number: [id number] Patient Account Number: [id number] Date of Birth/Sex: Apr 16, 1966 (52 y.o. M) Treating RN: Curtis Sitesorthy, Joanna Primary Care Vincentina Sollers: Corine ShelterKILPATRICK, GEORGE Other Clinician: Referring Taleen Prosser: Corine ShelterKILPATRICK, GEORGE Treating Wynn Alldredge/Extender: Youlanda RoysKnight, Ikeshia Weeks in Treatment: 1 Visit Information History Since Last Visit Added or deleted any medications: No Patient Arrived: Crutches Any new allergies or adverse reactions: No Arrival Time: 13:12 Had a fall or experienced change in No Accompanied By: self activities of daily living that may affect Transfer Assistance: None risk of falls: Patient Identification Verified: Yes Signs or symptoms of abuse/neglect since last visito No Secondary Verification Process Yes Hospitalized since last visit: No Completed: Implantable device outside of the clinic excluding No Patient Has Alerts: Yes cellular tissue based products placed in the center Patient Alerts: Patient on Blood since last visit: Thinner Has Dressing in Place as Prescribed: Yes Type One Diabetic Pain Present Now: No Plavix 81 mg aspirin Electronic Signature(s) Signed: 05/08/2018 4:16:34 PM By: Dayton MartesWallace, RCP,RRT,CHT, Sallie RCP, RRT, CHT Entered By: Dayton MartesWallace, RCP,RRT,CHT, Sallie on 05/08/2018 13:14:03 Cesar Ali, Cesar D. ([id number]) -------------------------------------------------------------------------------- Clinic Level of Care Assessment Details Patient Name: Cesar Ali, Georgi D. Date of Service: 05/08/2018 1:15 PM Medical Record Number: [id number] Patient Account Number: [id number] Date of Birth/Sex: Apr 16, 1966 (52 y.o. M) Treating RN: Curtis Sitesorthy, Joanna Primary Care Nava Song: Corine ShelterKILPATRICK, GEORGE Other Clinician: Referring Cayli Escajeda: Corine ShelterKILPATRICK, GEORGE Treating Arif Amendola/Extender: Youlanda RoysKnight,  Ikeshia Weeks in Treatment: 1 Clinic Level of Care Assessment Items TOOL 4 Quantity Score []  - Use when only an EandM is performed on FOLLOW-UP visit 0 ASSESSMENTS - Nursing Assessment / Reassessment X - Reassessment of Co-morbidities (includes updates in patient status) 1 10 X- 1 5 Reassessment of Adherence to Treatment Plan ASSESSMENTS - Wound and Skin Assessment / Reassessment X - Simple Wound Assessment / Reassessment - one wound 1 5 []  - 0 Complex Wound Assessment / Reassessment - multiple wounds []  - 0 Dermatologic / Skin Assessment (not related to wound area) ASSESSMENTS - Focused Assessment []  - Circumferential Edema Measurements - multi extremities 0 []  - 0 Nutritional Assessment / Counseling / Intervention X- 1 5 Lower Extremity Assessment (monofilament, tuning fork, pulses) X- 1 10 Peripheral Arterial Disease Assessment (using hand held doppler) ASSESSMENTS - Ostomy and/or Continence Assessment and Care []  - Incontinence Assessment and Management 0 []  - 0 Ostomy Care Assessment and Management (repouching, etc.) PROCESS - Coordination of Care X - Simple Patient / Family Education for ongoing care 1 15 []  - 0 Complex (extensive) Patient / Family Education for ongoing care []  - 0 Staff obtains ChiropractorConsents, Records, Test Results / Process Orders []  - 0 Staff telephones HHA, Nursing Homes / Clarify orders / etc []  - 0 Routine Transfer to another Facility (non-emergent condition) []  - 0 Routine Hospital Admission (non-emergent condition) []  - 0 New Admissions / Manufacturing engineernsurance Authorizations / Ordering NPWT, Apligraf, etc. []  - 0 Emergency Hospital Admission (emergent condition) X- 1 10 Simple Discharge Coordination Cesar Ali, Cesar D. ([id number]) []  - 0 Complex (extensive) Discharge Coordination PROCESS - Special Needs []  - Pediatric / Minor Patient Management 0 []  - 0 Isolation Patient Management []  - 0 Hearing / Language / Visual special needs []  - 0 Assessment  of Community assistance (transportation, D/C planning, etc.) []  - 0 Additional assistance / Altered mentation []  - 0 Support Surface(s) Assessment (bed, cushion, seat, etc.) INTERVENTIONS - Wound Cleansing / Measurement X - Simple Wound  Cleansing - one wound 1 5 []  - 0 Complex Wound Cleansing - multiple wounds X- 1 5 Wound Imaging (photographs - any number of wounds) []  - 0 Wound Tracing (instead of photographs) X- 1 5 Simple Wound Measurement - one wound []  - 0 Complex Wound Measurement - multiple wounds INTERVENTIONS - Wound Dressings X - Small Wound Dressing one or multiple wounds 1 10 []  - 0 Medium Wound Dressing one or multiple wounds []  - 0 Large Wound Dressing one or multiple wounds []  - 0 Application of Medications - topical []  - 0 Application of Medications - injection INTERVENTIONS - Miscellaneous []  - External ear exam 0 []  - 0 Specimen Collection (cultures, biopsies, blood, body fluids, etc.) []  - 0 Specimen(s) / Culture(s) sent or taken to Lab for analysis []  - 0 Patient Transfer (multiple staff / Nurse, adult / Similar devices) []  - 0 Simple Staple / Suture removal (25 or less) []  - 0 Complex Staple / Suture removal (26 or more) []  - 0 Hypo / Hyperglycemic Management (close monitor of Blood Glucose) []  - 0 Ankle / Brachial Index (ABI) - do not check if billed separately X- 1 5 Vital Signs EKAM, BESSON ([id number]) Has the patient been seen at the hospital within the last three years: Yes Total Score: 90 Level Of Care: New/Established - Level 3 Electronic Signature(s) Signed: 05/08/2018 5:02:25 PM By: Curtis Sites Entered By: Curtis Sites on 05/08/2018 13:41:25 Cesar Acton ([id number]) -------------------------------------------------------------------------------- Encounter Discharge Information Details Patient Name: Cesar Acton. Date of Service: 05/08/2018 1:15 PM Medical Record Number: [id number] Patient Account Number:  [id number] Date of Birth/Sex: [birth year] (53 y.o. M) Treating RN: Curtis Sites Primary Care Drue Harr: Corine Shelter Other Clinician: Referring Erez Mccallum: Corine Shelter Treating Vineta Carone/Extender: Youlanda Roys in Treatment: 1 Encounter Discharge Information Items Discharge Condition: Stable Ambulatory Status: Crutches Discharge Destination: Home Transportation: Private Auto Accompanied By: self Schedule Follow-up Appointment: No Clinical Summary of Care: Notes Patient states he is moving and will be transferring is care to a wound center in [place]. Electronic Signature(s) Signed: 05/08/2018 5:02:25 PM By: Curtis Sites Entered By: Curtis Sites on 05/08/2018 13:52:24 Cesar Acton ([id number]) -------------------------------------------------------------------------------- Lower Extremity Assessment Details Patient Name: Cesar Ali, Cesar Ali. Date of Service: 05/08/2018 1:15 PM Medical Record Number: [id number] Patient Account Number: [id number] Date of Birth/Sex: [birth year] (52 y.o. M) Treating RN: Huel Coventry Primary Care Kaulana Brindle: Corine Shelter Other Clinician: Referring Shalaine Payson: Corine Shelter Treating Edrees Valent/Extender: Wilkie Aye Weeks in Treatment: 1 Vascular Assessment Pulses: Dorsalis Pedis Palpable: [Left:No] Doppler Audible: [Left:Yes] Posterior Tibial Palpable: [Left:No] Doppler Audible: [Left:Yes] Extremity colors, hair growth, and conditions: Extremity Color: [Left:Hyperpigmented] Hair Growth on Extremity: [Left:No] Temperature of Extremity: [Left:Warm] Capillary Refill: [Left:> 3 seconds] Toe Nail Assessment Left: Right: Thick: Yes Discolored: Yes Deformed: No Improper Length and Hygiene: No Electronic Signature(s) Signed: 05/08/2018 4:12:20 PM By: Elliot Gurney, BSN, RN, CWS, Kim RN, BSN Entered By: Elliot Gurney, BSN, RN, CWS, Kim on 05/08/2018 13:33:14 Cesar Ali, Cesar Ali  ([id number]) -------------------------------------------------------------------------------- Multi Wound Chart Details Patient Name: Cesar Acton. Date of Service: 05/08/2018 1:15 PM Medical Record Number: [id number] Patient Account Number: [id number] Date of Birth/Sex: 1966-05-12 (52 y.o. M) Treating RN: Curtis Sites Primary Care Zaina Jenkin: Corine Shelter Other Clinician: Referring Amos Micheals: Corine Shelter Treating Infantof Villagomez/Extender: Wilkie Aye Weeks in Treatment: 1 Vital Signs Height(in): 72 Pulse(bpm): 84 Weight(lbs): 178 Blood Pressure(mmHg): 118/81 Body Mass Index(BMI): 24 Temperature(F): 98.1 Respiratory Rate 16 (breaths/min): Photos: [1:No Photos] [N/A:N/A] Wound Location: [1:Left Malleolus - Lateral] [N/A:N/A] Wounding Event: [1:Surgical Injury] [  N/A:N/A] Primary Etiology: [1:Open Surgical Wound] [N/A:N/A] Secondary Etiology: [1:Diabetic Wound/Ulcer of the Lower Extremity] [N/A:N/A] Comorbid History: [1:Type I Diabetes] [N/A:N/A] Date Acquired: [1:04/03/2018] [N/A:N/A] Weeks of Treatment: [1:1] [N/A:N/A] Wound Status: [1:Open] [N/A:N/A] Measurements L x W x D [1:5.3x1x0.9] [N/A:N/A] (cm) Area (cm) : [1:4.163] [N/A:N/A] Volume (cm) : [1:3.746] [N/A:N/A] % Reduction in Area: [1:-135.60%] [N/A:N/A] % Reduction in Volume: [1:-961.20%] [N/A:N/A] Classification: [1:Full Thickness Without Exposed Support Structures] [N/A:N/A] Exudate Amount: [1:Large] [N/A:N/A] Exudate Type: [1:Serosanguineous] [N/A:N/A] Exudate Color: [1:red, brown] [N/A:N/A] Wound Margin: [1:Epibole] [N/A:N/A] Granulation Amount: [1:Medium (34-66%)] [N/A:N/A] Granulation Quality: [1:Red] [N/A:N/A] Necrotic Amount: [1:Medium (34-66%)] [N/A:N/A] Necrotic Tissue: [1:Eschar, Adherent Slough] [N/A:N/A] Exposed Structures: [1:Fat Layer (Subcutaneous Tissue) Exposed: Yes Fascia: No Tendon: No Muscle: No Joint: No Bone: No] [N/A:N/A] Epithelialization: [1:None] [N/A:N/A] Periwound  Skin Texture: [1:Induration: Yes Scarring: Yes] [N/A:N/A] Periwound Skin Moisture: [1:No Abnormalities Noted] [N/A:N/A] Periwound Skin Color: Hemosiderin Staining: Yes N/A N/A Temperature: No Abnormality N/A N/A Tenderness on Palpation: Yes N/A N/A Wound Preparation: Ulcer Cleansing: N/A N/A Rinsed/Irrigated with Saline Topical Anesthetic Applied: Other: lidocaine 4% Treatment Notes Wound #1 (Left, Lateral Malleolus) Notes silvercel, drawtex, tegaderm, TCA/lotion mixture to foot Electronic Signature(s) Signed: 05/13/2018 5:29:42 PM By: Wilkie Aye FNP-C Entered By: Wilkie Aye on 05/08/2018 14:57:16 Cesar Acton ([id number]) -------------------------------------------------------------------------------- Multi-Disciplinary Care Plan Details Patient Name: Cesar Ali, IHDE. Date of Service: 05/08/2018 1:15 PM Medical Record Number: [id number] Patient Account Number: [id number] Date of Birth/Sex: 10-May-1966 (52 y.o. M) Treating RN: Curtis Sites Primary Care Sonora Catlin: Corine Shelter Other Clinician: Referring Joseantonio Dittmar: Corine Shelter Treating Charan Prieto/Extender: Youlanda Roys in Treatment: 1 Active Inactive Electronic Signature(s) Signed: 05/21/2018 10:53:38 AM By: Elliot Gurney, BSN, RN, CWS, Kim RN, BSN Signed: 08/20/2018 10:45:48 AM By: Curtis Sites Previous Signature: 05/08/2018 5:02:25 PM Version By: Curtis Sites Entered By: Elliot Gurney BSN, RN, CWS, Kim on 05/21/2018 10:53:37 Cesar Ali, Cesar Ali ([id number]) -------------------------------------------------------------------------------- Pain Assessment Details Patient Name: Cesar Ali, Cesar Ali. Date of Service: 05/08/2018 1:15 PM Medical Record Number: [id number] Patient Account Number: [id number] Date of Birth/Sex: Apr 24, 1966 (52 y.o. M) Treating RN: Curtis Sites Primary Care Kayia Billinger: Corine Shelter Other Clinician: Referring Chidera Thivierge: Corine Shelter Treating Jelicia Nantz/Extender: Wilkie Aye Weeks in Treatment: 1 Active Problems Location of Pain Severity and Description of Pain Patient Has Paino No Site Locations Pain Management and Medication Current Pain Management: Electronic Signature(s) Signed: 05/08/2018 4:16:34 PM By: Sallee Provencal, RRT, CHT Signed: 05/08/2018 5:02:25 PM By: Curtis Sites Entered By: Dayton Martes on 05/08/2018 13:14:12 Cesar Acton ([id number]) -------------------------------------------------------------------------------- Patient/Caregiver Education Details Patient Name: Cesar Ali, Cesar Ali. Date of Service: 05/08/2018 1:15 PM Medical Record Number: [id number] Patient Account Number: [id number] Date of Birth/Gender: 1965-10-21 (52 y.o. M) Treating RN: Curtis Sites Primary Care Physician: Corine Shelter Other Clinician: Referring Physician: Corine Shelter Treating Physician/Extender: Youlanda Roys in Treatment: 1 Education Assessment Education Provided To: Patient Education Topics Provided Wound/Skin Impairment: Handouts: Other: wound care as ordered Methods: Demonstration, Explain/Verbal Responses: State content correctly Electronic Signature(s) Signed: 05/08/2018 5:02:25 PM By: Curtis Sites Entered By: Curtis Sites on 05/08/2018 13:42:14 Cesar Acton ([id number]) -------------------------------------------------------------------------------- Wound Assessment Details Patient Name: Cesar Acton. Date of Service: 05/08/2018 1:15 PM Medical Record Number: [id number] Patient Account Number: [id number] Date of Birth/Sex: 1966-03-04 (52 y.o. M) Treating RN: Huel Coventry Primary Care Sherlynn Tourville: Corine Shelter Other Clinician: Referring Dauna Ziska: Corine Shelter Treating Husayn Reim/Extender: Wilkie Aye Weeks in Treatment: 1 Wound Status Wound Number: 1 Primary Etiology: Open Surgical Wound Wound Location: Left Malleolus - Lateral Secondary Diabetic  Wound/Ulcer of the  Lower Etiology: Extremity Wounding Event: Surgical Injury Wound Status: Open Date Acquired: 04/03/2018 Comorbid History: Type I Diabetes Weeks Of Treatment: 1 Clustered Wound: No Photos Photo Uploaded By: Elliot Gurney, BSN, RN, CWS, Kim on 05/08/2018 15:02:58 Wound Measurements Length: (cm) 5.3 Width: (cm) 1 Depth: (cm) 0.9 Area: (cm) 4.163 Volume: (cm) 3.746 % Reduction in Area: -135.6% % Reduction in Volume: -961.2% Epithelialization: None Wound Description Full Thickness Without Exposed Support Foul Odo Classification: Structures Slough/F Wound Margin: Epibole Exudate Large Amount: Exudate Type: Serosanguineous Exudate Color: red, brown r After Cleansing: No ibrino Yes Wound Bed Granulation Amount: Medium (34-66%) Exposed Structure Granulation Quality: Red Fascia Exposed: No Necrotic Amount: Medium (34-66%) Fat Layer (Subcutaneous Tissue) Exposed: Yes Necrotic Quality: Eschar, Adherent Slough Tendon Exposed: No Muscle Exposed: No Joint Exposed: No Bone Exposed: No Cesar Ali, Cesar Ali ([id number]) Periwound Skin Texture Texture Color No Abnormalities Noted: No No Abnormalities Noted: No Induration: Yes Hemosiderin Staining: Yes Scarring: Yes Temperature / Pain Moisture Temperature: No Abnormality No Abnormalities Noted: No Tenderness on Palpation: Yes Wound Preparation Ulcer Cleansing: Rinsed/Irrigated with Saline Topical Anesthetic Applied: Other: lidocaine 4%, Electronic Signature(s) Signed: 05/08/2018 4:12:20 PM By: Elliot Gurney, BSN, RN, CWS, Kim RN, BSN Entered By: Elliot Gurney, BSN, RN, CWS, Kim on 05/08/2018 13:28:08 MARQUIS, DOWN ([id number]) -------------------------------------------------------------------------------- Vitals Details Patient Name: ANDERSEN, IORIO. Date of Service: 05/08/2018 1:15 PM Medical Record Number: [id number] Patient Account Number: [id number] Date of Birth/Sex: [birth year] (52 y.o. M) Treating RN: Curtis Sites Primary Care Espyn Radwan: Corine Shelter Other Clinician: Referring Breean Nannini: Corine Shelter Treating Khiyan Crace/Extender: Wilkie Aye Weeks in Treatment: 1 Vital Signs Time Taken: 13:15 Temperature (F): 98.1 Height (in): 72 Pulse (bpm): 84 Weight (lbs): 178 Respiratory Rate (breaths/min): 16 Body Mass Index (BMI): 24.1 Blood Pressure (mmHg): 118/81 Reference Range: 80 - 120 mg / dl Electronic Signature(s) Signed: 05/08/2018 4:16:34 PM By: Dayton Martes RCP, RRT, CHT Entered By: Dayton Martes on 05/08/2018 13:16:54

## 2018-05-14 NOTE — Progress Notes (Signed)
BELINDA, BRINGHURST ([id number]) Visit Report for 05/08/2018 Chief Complaint Document Details Patient Name: Cesar Ali, Cesar Ali. Date of Service: 05/08/2018 1:15 PM Medical Record Number: [id number] Patient Account Number: [id number] Date of Birth/Sex: 1966/03/12 (52 y.o. M) Treating RN: Curtis Sites Primary Care Provider: Corine Shelter Other Clinician: Referring Provider: Corine Shelter Treating Provider/Extender: Wilkie Aye Weeks in Treatment: 1 Information Obtained from: Patient Chief Complaint Left ankle surgical ulcer Electronic Signature(s) Signed: 05/13/2018 5:29:42 PM By: Wilkie Aye FNP-C Entered By: Wilkie Aye on 05/08/2018 14:57:43 Cesar Ali ([id number]) -------------------------------------------------------------------------------- HPI Details Patient Name: Cesar Ali. Date of Service: 05/08/2018 1:15 PM Medical Record Number: [id number] Patient Account Number: [id number] Date of Birth/Sex: 1965-12-16 (52 y.o. M) Treating RN: Curtis Sites Primary Care Provider: Corine Shelter Other Clinician: Referring Provider: Corine Shelter Treating Provider/Extender: Youlanda Roys in Treatment: 1 History of Present Illness HPI Description: 05/01/18 on evaluation today patient is actually seen at the request of [facility] concerning a nonhealing wound of the left lateral malleolus status post open reduction internal fixation of the left ankle on 04/03/18 the surgery was necessitated by an injury which occurred on 04/03/18 when he failed at his home. He states he was out in the yard he did not trip but subsequently did injure his ankle badly. He does have diabetes mellitus type I but she's had since 52 years old. He also has stage III kidney failure. The patient had four strokes in 2010 the fortunately he does not have severe residual weakness he does so to some general weakness. His sutures were actually  remove yesterday by Dr. Eulah Pont. Subsequently there were two concerns that were noted during that visit on 04/25/18. One was that there was a risk of infection which she was started on antibiotics for although I do not see documentation with that antibiotic regimen was. The patient is unsure. Secondly it does appear that at that visit the sutures were actually left in for one more week although they were actually removed yesterday which is when the physician assistant [facility] Martinson contacted me to see whether or not we would be able to evaluate the patient and see if we can help in some regard with this. Subsequently the patient does seem to have some Slough and tissue breakdown in the central portion of the wound. I did actually probe around prior to debridement and noted that there was some undermining especially on the anterior portion of the wound edge. No fevers, chills, nausea, or vomiting noted at this time. Of note we did receive documentation from the orthopedic clinic releasing the patient during the postop surgical. To [place] for evaluation and treatment of the wound signed by the provider. 05/08/2018 patient seen today for follow-up of a nonhealing left lateral malleolus wound. Recently received approval for him to obtain ABIs. However he stated today that he is moving back to [facility]. Explained the importance of ongoing wound care and the need for adequate follow-up. He was given the information to a heaologics wound care center in [facility]. He stated that he would schedule his follow-up appointment with the clinic in [place] for ongoing wound care. Explained to him that if there is a change in his living arrangements to wear that he does not move to [facility], he is encouraged to contact our office to schedule a follow-up appointment. His wound would benefit from debridement due to a moderate amount of adherent slough. Unable to debrid at  this  time as we do not have ABIs. He denies any fevers, chills, shortness of breath, or changes in mobility. Electronic Signature(s) Signed: 05/13/2018 5:29:42 PM By: Wilkie Aye FNP-C Entered By: Wilkie Aye on 05/08/2018 15:02:01 Cesar Ali ([id number]) -------------------------------------------------------------------------------- Physical Exam Details Patient Name: Cesar Ali, Cesar Ali. Date of Service: 05/08/2018 1:15 PM Medical Record Number: [id number] Patient Account Number: [id number] Date of Birth/Sex: [birth year] (52 y.o. M) Treating RN: Curtis Sites Primary Care Provider: Corine Shelter Other Clinician: Referring Provider: Corine Shelter Treating Provider/Extender: Wilkie Aye Weeks in Treatment: 1 Constitutional . appears in no distress. Respiratory Respiratory effort is easy and symmetric bilaterally. Rate is normal at rest and on room air.. Cardiovascular Extremities are free of varicosities, clubbing or edema. Peripheral pulses strong and equal. Capillary refill < 3 seconds.. Musculoskeletal Gait and station stable.. Integumentary (Hair, Skin) open wound. Notes 05/08/2018 left ankle wound has a moderate amount of necrotic slough off at the center portion of the wound. He would have benefited from debridement today. Due to the fact that he is scheduled to move to a different city and may not have adequate follow-up and no recent ABIs it was elected to hold on debridement at this time. We will send approval letter for ABIs with the patient for him to take with him once he schedules a follow-up appointment at the clinic in [facility]. His blood flow to the lower extremities is of concern which is the reason of wound tx for caution during his visit today. Electronic Signature(s) Signed: 05/13/2018 5:29:42 PM By: Wilkie Aye FNP-C Entered By: Wilkie Aye on 05/08/2018 15:12:30 Cesar Ali  ([id number]) -------------------------------------------------------------------------------- Physician Orders Details Patient Name: Cesar Ali, Cesar Ali. Date of Service: 05/08/2018 1:15 PM Medical Record Number: [id number] Patient Account Number: [id number] Date of Birth/Sex: [birth year] (52 y.o. M) Treating RN: Curtis Sites Primary Care Provider: Corine Shelter Other Clinician: Referring Provider: Corine Shelter Treating Provider/Extender: Youlanda Roys in Treatment: 1 Verbal / Phone Orders: No Diagnosis Coding Wound Cleansing Wound #1 Left,Lateral Malleolus o Clean wound with Normal Saline. o Cleanse wound with mild soap and water o May Shower, gently pat wound dry prior to applying new dressing. Anesthetic (add to Medication List) Wound #1 Left,Lateral Malleolus o Topical Lidocaine 4% cream applied to wound bed prior to debridement (In Clinic Only). Skin Barriers/Peri-Wound Care Wound #1 Left,Lateral Malleolus o Moisturizing lotion - Please use generic Eucerin Lotion mixed half and half with triamcinolone cream and apply this to the dry skin on your leg surrounding the wound but not under the bandage o Triamcinolone Acetonide Ointment (TCA) Primary Wound Dressing Wound #1 Left,Lateral Malleolus o Silver Alginate Secondary Dressing Wound #1 Left,Lateral Malleolus o Tegaderm o Drawtex Dressing Change Frequency Wound #1 Left,Lateral Malleolus o Change dressing every other day. Follow-up Appointments Wound #1 Left,Lateral Malleolus o Return Appointment in 1 week. Edema Control Wound #1 Left,Lateral Malleolus o Elevate legs to the level of the heart and pump ankles as often as possible Off-Loading Wound #1 Left,Lateral Malleolus o Other: - continue wearing your walking boot Cesar Ali, Cesar Ali ([id number]) Home Health Wound #1 Left,Lateral Malleolus o Initiate Home Health for Skilled Nursing o Home Health Nurse may visit PRN  to address patientos wound care needs. o FACE TO FACE ENCOUNTER: MEDICARE and MEDICAID PATIENTS: I certify that this patient is under my care and that I had a face-to-face encounter that meets the physician face-to-face encounter requirements with this patient on this date. The encounter with the patient was in whole  or in part for the following MEDICAL CONDITION: (primary reason for Home Healthcare) MEDICAL NECESSITY: I certify, that based on my findings, NURSING services are a medically necessary home health service. HOME BOUND STATUS: I certify that my clinical findings support that this patient is homebound (i.e., Due to illness or injury, pt requires aid of supportive devices such as crutches, cane, wheelchairs, walkers, the use of special transportation or the assistance of another person to leave their place of residence. There is a normal inability to leave the home and doing so requires considerable and taxing effort. Other absences are for medical reasons / religious services and are infrequent or of short duration when for other reasons). o If current dressing causes regression in wound condition, may D/C ordered dressing product/s and apply Normal Saline Moist Dressing daily until next Wound Healing Center / Other MD appointment. Notify Wound Healing Center of regression in wound condition at [phone]. o Please direct any NON-WOUND related issues/requests for orders to patient's Primary Care Physician Electronic Signature(s) Signed: 05/13/2018 5:29:42 PM By: Wilkie Aye FNP-C Entered By: Wilkie Aye on 05/08/2018 15:09:14 Cesar Ali ([id number]) -------------------------------------------------------------------------------- Problem List Details Patient Name: Cesar Ali, Cesar Ali. Date of Service: 05/08/2018 1:15 PM Medical Record Number: [id number] Patient Account Number: [id number] Date of Birth/Sex: Oct 28, 1965 (52 y.o. 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 Date of Service: 05/08/2018 1:15 PM Medical Record Number: [id number] Patient Account Number: [id number] Date of Birth/Sex: 09/11/1965 (52 y.o. M) Treating RN: Curtis Sites Primary Care Provider: Corine Shelter Other Clinician: Referring Provider: Corine Shelter Treating Provider/Extender: Youlanda Roys in Treatment: 1 Subjective Chief Complaint Information obtained from Patient Left ankle surgical ulcer History of Present Illness (HPI) 05/01/18 on evaluation today patient is actually seen at the request of [facility] concerning a nonhealing wound of the left lateral malleolus status post open reduction internal fixation of the left ankle on 04/03/18 the surgery was necessitated by an injury which occurred on 04/03/18 when he failed at his home. He states he was out in the yard he did not trip but subsequently did injure his ankle badly. He does have diabetes mellitus type I but she's had since 52 years  old. He also has stage III kidney failure. The patient had four strokes in 2010 the fortunately he does not have severe residual weakness he does so to some general weakness. His sutures were actually remove yesterday by Dr. Eulah Pont. Subsequently there were two concerns that were noted during that visit on 04/25/18. One was that there was a risk of infection which she was started on antibiotics for although I do not see documentation with that antibiotic regimen was. The patient is unsure. Secondly it does appear that at that visit the sutures were actually left in for one more week although they were actually removed yesterday which  is when the [facility] contacted me to see whether or not we would be able to evaluate the patient and see if we can help in some regard with this. Subsequently the patient does seem to have some Slough and tissue breakdown in the central portion of the wound. I did actually probe around prior to debridement and noted that there was some undermining especially on the anterior portion of the wound edge. No fevers, chills, nausea, or vomiting noted at this time. Of note we did receive documentation from the orthopedic clinic releasing the patient during the postop surgical. To us for evaluation and treatment of the wound signed by the provider. 05/08/2018 patient seen today for follow-up of a nonhealing left lateral malleolus wound. Recently received approval for him to obtain ABIs. However he stated today that he is [place] [facility] [place]. Explained the importance of ongoing wound care and the need for adequate follow-up. He was given the information to a [facility] [place] [facility] [place]. He stated that he would schedule his follow-up appointment with the clinic in [place] for ongoing wound care. Explained to him that if there is a change in his living arrangements to wear that he does not move  to [facility] [place], he is encouraged to contact our office to schedule a follow-up appointment. His wound would benefit from debridement due to a moderate amount of adherent slough. Unable to debrid at this time as we do not have ABIs. He denies any fevers, chills, shortness of breath, or changes in mobility. Patient History Information obtained from Patient. Family History Cancer - Siblings, Diabetes - Mother,Maternal Grandparents, Heart Disease - Maternal Grandparents, Hypertension - Maternal Grandparents, No family history of Kidney Disease, Lung Disease, Seizures, Stroke, Thyroid Problems, Tuberculosis. Social History Former smoker, Marital Status - Single, Alcohol Use - Rarely, Drug Use - Prior History, Caffeine Use - Daily. Medical History Cesar ActonMCALLISTER, Caleb D. ([id number]) Hospitalization/Surgery History - [facility]/03/2018, [facility]. Medical And Surgical History Notes Constitutional Symptoms (General Health) 4 strokes in 2010 Review of Systems (ROS) Constitutional Symptoms (General Health) The patient has no complaints or symptoms. Respiratory The patient has no complaints or symptoms. Cardiovascular The patient has no complaints or symptoms. Integumentary (Skin) Complains or has symptoms of Wounds - left ankle. Objective Constitutional appears in no distress. Vitals Time Taken: 1:15 PM, Height: 72 in, Weight: 178 lbs, BMI: 24.1, Temperature: 98.1 F, Pulse: 84 bpm, Respiratory Rate: 16 breaths/min, Blood Pressure: 118/81 mmHg. Respiratory Respiratory effort is easy and symmetric bilaterally. Rate is normal at rest and on room air.. Cardiovascular Extremities are free of varicosities, clubbing or edema. Peripheral pulses strong and equal. Capillary refill < 3 seconds.. General Notes: 05/08/2018 left ankle wound has a moderate amount of necrotic slough off at the center portion of the wound. He would have benefited from debridement today. Due to the fact  that he is scheduled to move to a different city and may not have adequate follow-up and no recent ABIs it was elected to hold on debridement at this time. We will send approval letter for ABIs with the patient for him to take with him once he schedules a follow-up appointment at the clinic [place] [facility] [place]. His blood flow to the lower extremities is of concern which is the reason of wound tx for caution during his visit today. Integumentary (Hair, Skin) open wound. Wound #1 status is Open. Original cause of wound was Surgical  Injury. The wound is located on the Left,Lateral Malleolus. The wound measures 5.3cm length x 1cm width x 0.9cm depth; 4.163cm^2 area and 3.746cm^3 volume. There is Fat Layer (Subcutaneous Tissue) Exposed exposed. There is a large amount of serosanguineous drainage noted. The wound margin is epibole. There is medium (34-66%) red granulation within the wound bed. There is a medium (34-66%) amount of necrotic tissue within the wound bed including Eschar and Adherent Slough. The periwound skin appearance exhibited: Induration, Scarring, Hemosiderin Staining. Periwound temperature was noted as No Abnormality. The periwound has tenderness on palpation. Cesar Ali, Cesar Ali ([id number]) Assessment Active Problems ICD-10 Disruption of external operation (surgical) wound, not elsewhere classified, initial encounter Non-pressure chronic ulcer of left ankle with fat layer exposed Type 1 diabetes mellitus with other skin ulcer Chronic kidney disease, stage 3 (moderate) Plan Wound Cleansing: Wound #1 Left,Lateral Malleolus: Clean wound with Normal Saline. Cleanse wound with mild soap and water May Shower, gently pat wound dry prior to applying new dressing. Anesthetic (add to Medication List): Wound #1 Left,Lateral Malleolus: Topical Lidocaine 4% cream applied to wound bed prior to debridement (In Clinic Only). Skin Barriers/Peri-Wound Care: Wound #1 Left,Lateral  Malleolus: Moisturizing lotion - Please use generic Eucerin Lotion mixed half and half with triamcinolone cream and apply this to the dry skin on your leg surrounding the wound but not under the bandage Triamcinolone Acetonide Ointment (TCA) Primary Wound Dressing: Wound #1 Left,Lateral Malleolus: Silver Alginate Secondary Dressing: Wound #1 Left,Lateral Malleolus: Tegaderm Drawtex Dressing Change Frequency: Wound #1 Left,Lateral Malleolus: Change dressing every other day. Follow-up Appointments: Wound #1 Left,Lateral Malleolus: Return Appointment in 1 week. Edema Control: Wound #1 Left,Lateral Malleolus: Elevate legs to the level of the heart and pump ankles as often as possible Off-Loading: Wound #1 Left,Lateral Malleolus: Other: - continue wearing your walking boot Home Health: Wound #1 Left,Lateral Malleolus: Initiate Home Health for Skilled Nursing Home Health Nurse may visit PRN to address patient s wound care needs. FACE TO FACE ENCOUNTER: MEDICARE and MEDICAID PATIENTS: I certify that this patient is under my care and that I had a face-to-face encounter that meets the physician face-to-face encounter requirements with this patient on this date. The encounter with the patient was in whole or in part for the following MEDICAL CONDITION: (primary reason for Home Cesar Ali, Cesar Ali ([id number]) Healthcare) MEDICAL NECESSITY: I certify, that based on my findings, NURSING services are a medically necessary home health service. HOME BOUND STATUS: I certify that my clinical findings support that this patient is homebound (i.e., Due to illness or injury, pt requires aid of supportive devices such as crutches, cane, wheelchairs, walkers, the use of special transportation or the assistance of another person to leave their place of residence. There is a normal inability to leave the home and doing so requires considerable and taxing effort. Other absences are for medical reasons /  religious services and are infrequent or of short duration when for other reasons). If current dressing causes regression in wound condition, may D/C ordered dressing product/s and apply Normal Saline Moist Dressing daily until next Wound Healing Center / Other MD appointment. Notify Wound Healing Center of regression in wound condition at [phone]. Please direct any NON-WOUND related issues/requests for orders to patient's Primary Care Physician Electronic Signature(s) Signed: 05/13/2018 5:29:42 PM By: Wilkie Aye FNP-C Entered By: Wilkie Aye on 05/08/2018 15:11:01 Cesar Ali ([id number]) -------------------------------------------------------------------------------- ROS/PFSH Details Patient Name: Cesar Ali. Date of Service: 05/08/2018 1:15 PM Medical Record Number: [id number] Patient Account Number: [id number]  Date of Birth/Sex: 06/02/1966 (52 y.o. M) Treating RN: Curtis Sites Primary Care Provider: Corine Shelter Other Clinician: Referring Provider: Corine Shelter Treating Provider/Extender: Youlanda Roys in Treatment: 1 Information Obtained From Patient Wound History Do you currently have one or more open woundso Yes How many open wounds do you currently haveo 1 Approximately how long have you had your woundso 4 weeks How have you been treating your wound(s) until nowo outter dressing Has your wound(s) ever healed and then re-openedo No Have you had any lab work done in the past montho No Have you tested positive for an antibiotic resistant organism (MRSA, VRE)o No Have you tested positive for osteomyelitis (bone infection)o No Have you had any tests for circulation on your legso No Integumentary (Skin) Complaints and Symptoms: Positive for: Wounds - left ankle Medical History: Negative for: History of Burn; History of pressure wounds Constitutional Symptoms (General Health) Complaints and Symptoms: No Complaints or  Symptoms Medical History: Past Medical History Notes: 4 strokes in 2010 Eyes Medical History: Negative for: Cataracts; Glaucoma; Optic Neuritis Hematologic/Lymphatic Medical History: Negative for: Anemia; Hemophilia; Human Immunodeficiency Virus; Lymphedema; Sickle Cell Disease Respiratory Complaints and Symptoms: No Complaints or Symptoms Medical History: Negative for: Aspiration; Asthma; Chronic Obstructive Pulmonary Disease (COPD); Pneumothorax; Sleep Apnea; Tuberculosis Cesar Ali, Cesar Ali ([id number]) Cardiovascular Complaints and Symptoms: No Complaints or Symptoms Medical History: Negative for: Angina; Arrhythmia; Congestive Heart Failure; Coronary Artery Disease; Deep Vein Thrombosis; Hypertension; Hypotension; Myocardial Infarction; Peripheral Arterial Disease; Peripheral Venous Disease; Phlebitis; Vasculitis Gastrointestinal Medical History: Negative for: Cirrhosis ; Colitis; Crohnos; Hepatitis A; Hepatitis B; Hepatitis C Endocrine Medical History: Positive for: Type I Diabetes - since 14 Negative for: Type II Diabetes Time with diabetes: 52 years old Treated with: Insulin Blood sugar tested every day: No Genitourinary Medical History: Negative for: End Stage Renal Disease Immunological Medical History: Negative for: Lupus Erythematosus; Raynaudos; Scleroderma Musculoskeletal Medical History: Negative for: Gout; Rheumatoid Arthritis; Osteoarthritis; Osteomyelitis Neurologic Medical History: Negative for: Dementia; Neuropathy; Quadriplegia; Paraplegia; Seizure Disorder Oncologic Medical History: Negative for: Received Chemotherapy; Received Radiation Psychiatric Medical History: Negative for: Anorexia/bulimia; Confinement Anxiety Immunizations Pneumococcal Vaccine: Received Pneumococcal Vaccination: No Cesar Ali, Cesar Ali ([id number]) Implantable Devices Hospitalization / Surgery History Name of Hospital Purpose of Hospitalization/Surgery Date [facility] 04/03/2018 Family and Social History Cancer: Yes - Siblings; Diabetes: Yes - Mother,Maternal Grandparents; Heart Disease: Yes - Maternal Grandparents; Hypertension: Yes - Maternal Grandparents; Kidney Disease: No; Lung Disease: No; Seizures: No; Stroke: No; Thyroid Problems: No; Tuberculosis: No; Former smoker; Marital Status - Single; Alcohol Use: Rarely; Drug Use: Prior History; Caffeine Use: Daily; Advanced Directives: No; Patient does not want information on Advanced Directives; Do not resuscitate: No; Living Will: No; Medical Power of Attorney: No Physician Affirmation I have reviewed and agree with the above information. Electronic Signature(s) Signed: 05/08/2018 5:02:25 PM By: Curtis Sites Signed: 05/13/2018 5:29:42 PM By: Wilkie Aye FNP-C Entered By: Wilkie Aye on 05/08/2018 15:03:11 Cesar Ali, Cesar Ali ([id number]) -------------------------------------------------------------------------------- SuperBill Details Patient Name: Cesar Ali, Cesar Ali. Date of Service: 05/08/2018 Medical Record Number: [id number] Patient Account Number: [id number] Date of Birth/Sex: [birth year] (52 y.o. M) Treating RN: Curtis Sites Primary Care Provider: Corine Shelter Other Clinician: Referring Provider: Corine Shelter Treating Provider/Extender: Wilkie Aye Weeks in Treatment: 1 Diagnosis Coding ICD-10 Codes Code Description T81.31XA Disruption of external operation (surgical) wound, not elsewhere classified, initial encounter L97.322 Non-pressure chronic ulcer of left ankle with fat layer exposed E10.622 Type 1 diabetes mellitus with other skin ulcer N18.3 Chronic kidney disease, stage 3 (  moderate) Facility Procedures CPT4 Code: 96045409 Description: 99213 - WOUND CARE VISIT-LEV 3 EST PT Modifier: Quantity: 1 Physician Procedures CPT4 Code: 8119147 Description: 99213 - WC PHYS LEVEL 3 - EST PT ICD-10 Diagnosis Description L97.322 Non-pressure chronic ulcer  of left ankle with fat layer expo Modifier: sed Quantity: 1 Electronic Signature(s) Signed: 05/13/2018 5:29:42 PM By: Wilkie Aye FNP-C Entered By: Wilkie Aye on 05/08/2018 15:11:55

## 2018-05-16 ENCOUNTER — Ambulatory Visit: Payer: Medicaid Other

## 2018-05-16 ENCOUNTER — Ambulatory Visit
Admission: RE | Admit: 2018-05-16 | Discharge: 2018-05-16 | Disposition: A | Discharge status: Home / Self Care | Payer: Medicaid Other | Source: Ambulatory Visit | Attending: Physician Assistant | Admitting: Physician Assistant

## 2018-05-16 DIAGNOSIS — S81809A Unspecified open wound, unspecified lower leg, initial encounter: Secondary | ICD-10-CM | POA: Diagnosis present

## 2018-05-16 DIAGNOSIS — I739 Peripheral vascular disease, unspecified: Secondary | ICD-10-CM | POA: Insufficient documentation

## 2018-05-26 ENCOUNTER — Encounter (HOSPITAL_COMMUNITY)
Admission: RE | Disposition: A | Discharge status: Home / Self Care | Payer: Self-pay | Source: Home / Self Care | Attending: Orthopedic Surgery

## 2018-05-26 ENCOUNTER — Encounter (HOSPITAL_COMMUNITY): Payer: Self-pay | Admitting: *Deleted

## 2018-05-26 ENCOUNTER — Inpatient Hospital Stay (HOSPITAL_COMMUNITY)
Admission: RE | Admit: 2018-05-26 | Discharge: 2018-06-05 | DRG: 904 | Disposition: A | Discharge status: Home / Self Care | Payer: Medicaid Other | Attending: Orthopedic Surgery | Admitting: Orthopedic Surgery

## 2018-05-26 ENCOUNTER — Inpatient Hospital Stay (HOSPITAL_COMMUNITY): Payer: Medicaid Other

## 2018-05-26 ENCOUNTER — Ambulatory Visit (HOSPITAL_COMMUNITY): Payer: Medicaid Other | Admitting: Anesthesiology

## 2018-05-26 ENCOUNTER — Other Ambulatory Visit: Payer: Self-pay

## 2018-05-26 DIAGNOSIS — I69351 Hemiplegia and hemiparesis following cerebral infarction affecting right dominant side: Secondary | ICD-10-CM

## 2018-05-26 DIAGNOSIS — T8132XA Disruption of internal operation (surgical) wound, not elsewhere classified, initial encounter: Secondary | ICD-10-CM | POA: Diagnosis not present

## 2018-05-26 DIAGNOSIS — Z7901 Long term (current) use of anticoagulants: Secondary | ICD-10-CM

## 2018-05-26 DIAGNOSIS — E785 Hyperlipidemia, unspecified: Secondary | ICD-10-CM | POA: Diagnosis not present

## 2018-05-26 DIAGNOSIS — Y838 Other surgical procedures as the cause of abnormal reaction of the patient, or of later complication, without mention of misadventure at the time of the procedure: Secondary | ICD-10-CM | POA: Diagnosis present

## 2018-05-26 DIAGNOSIS — Z7902 Long term (current) use of antithrombotics/antiplatelets: Secondary | ICD-10-CM | POA: Diagnosis not present

## 2018-05-26 DIAGNOSIS — E1069 Type 1 diabetes mellitus with other specified complication: Secondary | ICD-10-CM | POA: Diagnosis present

## 2018-05-26 DIAGNOSIS — I129 Hypertensive chronic kidney disease with stage 1 through stage 4 chronic kidney disease, or unspecified chronic kidney disease: Secondary | ICD-10-CM | POA: Diagnosis not present

## 2018-05-26 DIAGNOSIS — M86162 Other acute osteomyelitis, left tibia and fibula: Secondary | ICD-10-CM | POA: Diagnosis not present

## 2018-05-26 DIAGNOSIS — Z79899 Other long term (current) drug therapy: Secondary | ICD-10-CM | POA: Diagnosis not present

## 2018-05-26 DIAGNOSIS — Z794 Long term (current) use of insulin: Secondary | ICD-10-CM | POA: Diagnosis not present

## 2018-05-26 DIAGNOSIS — N183 Chronic kidney disease, stage 3 (moderate): Secondary | ICD-10-CM | POA: Diagnosis present

## 2018-05-26 DIAGNOSIS — Z7982 Long term (current) use of aspirin: Secondary | ICD-10-CM | POA: Diagnosis not present

## 2018-05-26 DIAGNOSIS — T8469XA Infection and inflammatory reaction due to internal fixation device of other site, initial encounter: Secondary | ICD-10-CM | POA: Diagnosis present

## 2018-05-26 DIAGNOSIS — E1052 Type 1 diabetes mellitus with diabetic peripheral angiopathy with gangrene: Secondary | ICD-10-CM | POA: Diagnosis not present

## 2018-05-26 DIAGNOSIS — Z87891 Personal history of nicotine dependence: Secondary | ICD-10-CM

## 2018-05-26 DIAGNOSIS — M869 Osteomyelitis, unspecified: Secondary | ICD-10-CM

## 2018-05-26 DIAGNOSIS — S91002D Unspecified open wound, left ankle, subsequent encounter: Secondary | ICD-10-CM

## 2018-05-26 DIAGNOSIS — Z978 Presence of other specified devices: Secondary | ICD-10-CM | POA: Diagnosis not present

## 2018-05-26 DIAGNOSIS — S91002A Unspecified open wound, left ankle, initial encounter: Secondary | ICD-10-CM

## 2018-05-26 DIAGNOSIS — Z95828 Presence of other vascular implants and grafts: Secondary | ICD-10-CM | POA: Diagnosis not present

## 2018-05-26 DIAGNOSIS — E43 Unspecified severe protein-calorie malnutrition: Secondary | ICD-10-CM

## 2018-05-26 HISTORY — PX: HARDWARE REMOVAL: SHX979

## 2018-05-26 HISTORY — DX: Other acute osteomyelitis, left tibia and fibula: M86.162

## 2018-05-26 HISTORY — PX: I & D EXTREMITY: SHX5045

## 2018-05-26 LAB — CBC
HCT: 34 % — ABNORMAL LOW (ref 39.0–52.0)
Hemoglobin: 10.5 g/dL — ABNORMAL LOW (ref 13.0–17.0)
MCH: 26.4 pg (ref 26.0–34.0)
MCHC: 30.9 g/dL (ref 30.0–36.0)
MCV: 85.6 fL (ref 80.0–100.0)
Platelets: 465 10*3/uL — ABNORMAL HIGH (ref 150–400)
RBC: 3.97 MIL/uL — ABNORMAL LOW (ref 4.22–5.81)
RDW: 12.8 % (ref 11.5–15.5)
WBC: 12.5 10*3/uL — ABNORMAL HIGH (ref 4.0–10.5)
nRBC: 0 % (ref 0.0–0.2)

## 2018-05-26 LAB — GLUCOSE, CAPILLARY
Glucose-Capillary: 192 mg/dL — ABNORMAL HIGH (ref 70–99)
Glucose-Capillary: 241 mg/dL — ABNORMAL HIGH (ref 70–99)
Glucose-Capillary: 284 mg/dL — ABNORMAL HIGH (ref 70–99)
Glucose-Capillary: 246 mg/dL — ABNORMAL HIGH (ref 70–99)
Glucose-Capillary: 269 mg/dL — ABNORMAL HIGH (ref 70–99)

## 2018-05-26 LAB — CREATININE, SERUM
CREATININE: 1.79 mg/dL — AB (ref 0.61–1.24)
GFR calc Af Amer: 50 mL/min — ABNORMAL LOW (ref >60)
GFR calc non Af Amer: 43 mL/min — ABNORMAL LOW (ref >60)

## 2018-05-26 LAB — C-REACTIVE PROTEIN: CRP: 1 mg/dL — ABNORMAL HIGH (ref <1.0)

## 2018-05-26 LAB — SEDIMENTATION RATE: Sed Rate: 50 mm/hr — ABNORMAL HIGH (ref 0–16)

## 2018-05-26 SURGERY — IRRIGATION AND DEBRIDEMENT EXTREMITY
Anesthesia: General | Laterality: Left

## 2018-05-26 MED ORDER — PROPOFOL 10 MG/ML IV BOLUS
INTRAVENOUS | Status: DC | PRN
  Administered 2018-05-26: 150 mg via INTRAVENOUS

## 2018-05-26 MED ORDER — ACETAMINOPHEN 325 MG PO TABS: 325.0000 mg | ORAL_TABLET | Freq: Four times a day (QID) | ORAL | Status: DC | PRN

## 2018-05-26 MED ORDER — HYDROMORPHONE HCL 1 MG/ML IJ SOLN
INTRAMUSCULAR | Status: AC
  Filled 2018-05-26: qty 1

## 2018-05-26 MED ORDER — INSULIN ASPART 100 UNIT/ML ~~LOC~~ SOLN
SUBCUTANEOUS | Status: AC
  Filled 2018-05-26: qty 1

## 2018-05-26 MED ORDER — CHLORHEXIDINE GLUCONATE 4 % EX LIQD: 60.0000 mL | Freq: Once | CUTANEOUS | Status: DC

## 2018-05-26 MED ORDER — METHOCARBAMOL 500 MG PO TABS
ORAL_TABLET | ORAL | Status: AC
  Filled 2018-05-26: qty 1

## 2018-05-26 MED ORDER — ENOXAPARIN SODIUM 30 MG/0.3ML ~~LOC~~ SOLN
30.0000 mg | SUBCUTANEOUS | Status: DC
  Administered 2018-05-27 – 2018-06-04 (×6): 30 mg via SUBCUTANEOUS
  Filled 2018-05-26 (×8): qty 0.3

## 2018-05-26 MED ORDER — GABAPENTIN 300 MG PO CAPS
300.0000 mg | ORAL_CAPSULE | Freq: Three times a day (TID) | ORAL | Status: DC
  Administered 2018-05-26 – 2018-06-05 (×26): 300 mg via ORAL
  Filled 2018-05-26 (×26): qty 1

## 2018-05-26 MED ORDER — HYDROMORPHONE HCL 1 MG/ML IJ SOLN
0.2500 mg | INTRAMUSCULAR | Status: DC | PRN
  Administered 2018-05-26 (×2): 0.5 mg via INTRAVENOUS

## 2018-05-26 MED ORDER — DIPHENHYDRAMINE HCL 12.5 MG/5ML PO ELIX: 12.5000 mg | ORAL_SOLUTION | ORAL | Status: DC | PRN

## 2018-05-26 MED ORDER — DEXAMETHASONE SODIUM PHOSPHATE 10 MG/ML IJ SOLN
INTRAMUSCULAR | Status: DC | PRN
  Administered 2018-05-26: 5 mg via INTRAVENOUS

## 2018-05-26 MED ORDER — OXYCODONE HCL 5 MG PO TABS
5.0000 mg | ORAL_TABLET | Freq: Once | ORAL | Status: AC | PRN
  Administered 2018-05-26: 5 mg via ORAL

## 2018-05-26 MED ORDER — MORPHINE SULFATE (PF) 2 MG/ML IV SOLN
0.5000 mg | INTRAVENOUS | Status: DC | PRN
  Administered 2018-05-29: 0.5 mg via INTRAVENOUS
  Administered 2018-06-01: 1 mg via INTRAVENOUS
  Filled 2018-05-26 (×2): qty 1

## 2018-05-26 MED ORDER — PROPOFOL 10 MG/ML IV BOLUS
INTRAVENOUS | Status: AC
  Filled 2018-05-26: qty 20

## 2018-05-26 MED ORDER — METHOCARBAMOL 500 MG PO TABS
500.0000 mg | ORAL_TABLET | Freq: Four times a day (QID) | ORAL | Status: DC | PRN
  Administered 2018-05-26 – 2018-06-04 (×7): 500 mg via ORAL
  Filled 2018-05-26 (×6): qty 1

## 2018-05-26 MED ORDER — DOCUSATE SODIUM 100 MG PO CAPS
100.0000 mg | ORAL_CAPSULE | Freq: Two times a day (BID) | ORAL | Status: DC
  Administered 2018-05-26 – 2018-06-05 (×15): 100 mg via ORAL
  Filled 2018-05-26 (×17): qty 1

## 2018-05-26 MED ORDER — VANCOMYCIN HCL 1000 MG IV SOLR
INTRAVENOUS | Status: DC | PRN
  Administered 2018-05-26: 1000 mg via INTRAVENOUS

## 2018-05-26 MED ORDER — OXYCODONE HCL 5 MG/5ML PO SOLN: 5.0000 mg | Freq: Once | ORAL | Status: AC | PRN

## 2018-05-26 MED ORDER — BISACODYL 10 MG RE SUPP: 10.0000 mg | Freq: Every day | RECTAL | Status: DC | PRN

## 2018-05-26 MED ORDER — METHOCARBAMOL 1000 MG/10ML IJ SOLN
500.0000 mg | Freq: Four times a day (QID) | INTRAVENOUS | Status: DC | PRN
  Filled 2018-05-26: qty 5

## 2018-05-26 MED ORDER — LIDOCAINE 2% (20 MG/ML) 5 ML SYRINGE
INTRAMUSCULAR | Status: AC
  Filled 2018-05-26: qty 10

## 2018-05-26 MED ORDER — SODIUM CHLORIDE 0.9 % IV SOLN
INTRAVENOUS | Status: DC | PRN
  Administered 2018-05-26: 20 ug/min via INTRAVENOUS

## 2018-05-26 MED ORDER — PROPOFOL 1000 MG/100ML IV EMUL
INTRAVENOUS | Status: AC
  Filled 2018-05-26: qty 100

## 2018-05-26 MED ORDER — ONDANSETRON HCL 4 MG PO TABS: 4.0000 mg | ORAL_TABLET | Freq: Four times a day (QID) | ORAL | Status: DC | PRN

## 2018-05-26 MED ORDER — FENTANYL CITRATE (PF) 250 MCG/5ML IJ SOLN
INTRAMUSCULAR | Status: AC
  Filled 2018-05-26: qty 5

## 2018-05-26 MED ORDER — SODIUM CHLORIDE 0.9 % IR SOLN
Status: DC | PRN
  Administered 2018-05-26 (×3): 3000 mL

## 2018-05-26 MED ORDER — MAGNESIUM CITRATE PO SOLN: 1.0000 | Freq: Once | ORAL | Status: DC | PRN

## 2018-05-26 MED ORDER — POTASSIUM CHLORIDE IN NACL 20-0.45 MEQ/L-% IV SOLN
INTRAVENOUS | Status: DC
  Administered 2018-05-26 – 2018-05-28 (×2): via INTRAVENOUS
  Filled 2018-05-26 (×2): qty 1000

## 2018-05-26 MED ORDER — TRAMADOL HCL 50 MG PO TABS
50.0000 mg | ORAL_TABLET | Freq: Four times a day (QID) | ORAL | Status: DC
  Administered 2018-05-26 – 2018-06-05 (×36): 50 mg via ORAL
  Filled 2018-05-26 (×36): qty 1

## 2018-05-26 MED ORDER — POVIDONE-IODINE 10 % EX SWAB: 2.0000 "application " | Freq: Once | CUTANEOUS | Status: DC

## 2018-05-26 MED ORDER — MIDAZOLAM HCL 5 MG/5ML IJ SOLN
INTRAMUSCULAR | Status: DC | PRN
  Administered 2018-05-26: 2 mg via INTRAVENOUS

## 2018-05-26 MED ORDER — MIDAZOLAM HCL 2 MG/2ML IJ SOLN
INTRAMUSCULAR | Status: AC
  Filled 2018-05-26: qty 2

## 2018-05-26 MED ORDER — PROMETHAZINE HCL 25 MG/ML IJ SOLN: 6.2500 mg | INTRAMUSCULAR | Status: DC | PRN

## 2018-05-26 MED ORDER — ZOLPIDEM TARTRATE 5 MG PO TABS: 5.0000 mg | ORAL_TABLET | Freq: Every evening | ORAL | Status: DC | PRN

## 2018-05-26 MED ORDER — SODIUM CHLORIDE 0.9 % IV SOLN
INTRAVENOUS | Status: DC
  Administered 2018-05-26: 12:00:00 via INTRAVENOUS

## 2018-05-26 MED ORDER — ONDANSETRON HCL 4 MG/2ML IJ SOLN
INTRAMUSCULAR | Status: AC
  Filled 2018-05-26: qty 2

## 2018-05-26 MED ORDER — ONDANSETRON HCL 4 MG/2ML IJ SOLN
INTRAMUSCULAR | Status: DC | PRN
  Administered 2018-05-26: 4 mg via INTRAVENOUS

## 2018-05-26 MED ORDER — ONDANSETRON HCL 4 MG/2ML IJ SOLN: 4.0000 mg | Freq: Four times a day (QID) | INTRAMUSCULAR | Status: DC | PRN

## 2018-05-26 MED ORDER — METOCLOPRAMIDE HCL 5 MG PO TABS: 5.0000 mg | ORAL_TABLET | Freq: Three times a day (TID) | ORAL | Status: DC | PRN

## 2018-05-26 MED ORDER — POLYETHYLENE GLYCOL 3350 17 G PO PACK
17.0000 g | PACK | Freq: Every day | ORAL | Status: DC | PRN
  Administered 2018-06-04: 17 g via ORAL
  Filled 2018-05-26: qty 1

## 2018-05-26 MED ORDER — VANCOMYCIN HCL IN DEXTROSE 1-5 GM/200ML-% IV SOLN
INTRAVENOUS | Status: AC
  Filled 2018-05-26: qty 200

## 2018-05-26 MED ORDER — HYDROCODONE-ACETAMINOPHEN 7.5-325 MG PO TABS
1.0000 | ORAL_TABLET | ORAL | Status: DC | PRN
  Administered 2018-05-27 – 2018-05-28 (×5): 2 via ORAL
  Filled 2018-05-26 (×5): qty 2

## 2018-05-26 MED ORDER — SODIUM CHLORIDE 0.9 % IV SOLN
2.0000 g | INTRAVENOUS | Status: DC
  Administered 2018-05-26 – 2018-06-04 (×9): 2 g via INTRAVENOUS
  Filled 2018-05-26 (×11): qty 20

## 2018-05-26 MED ORDER — INSULIN ASPART 100 UNIT/ML ~~LOC~~ SOLN
5.0000 [IU] | Freq: Once | SUBCUTANEOUS | Status: AC
  Administered 2018-05-26: 5 [IU] via SUBCUTANEOUS

## 2018-05-26 MED ORDER — INSULIN ASPART 100 UNIT/ML ~~LOC~~ SOLN
3.0000 [IU] | Freq: Once | SUBCUTANEOUS | Status: AC
  Administered 2018-05-26: 3 [IU] via SUBCUTANEOUS

## 2018-05-26 MED ORDER — INSULIN ASPART 100 UNIT/ML ~~LOC~~ SOLN
0.0000 [IU] | Freq: Three times a day (TID) | SUBCUTANEOUS | Status: DC
  Administered 2018-05-26: 5 [IU] via SUBCUTANEOUS
  Administered 2018-05-27: 8 [IU] via SUBCUTANEOUS
  Administered 2018-05-27: 3 [IU] via SUBCUTANEOUS
  Administered 2018-05-27: 11 [IU] via SUBCUTANEOUS
  Administered 2018-05-28: 8 [IU] via SUBCUTANEOUS
  Administered 2018-05-28: 5 [IU] via SUBCUTANEOUS
  Administered 2018-05-28: 8 [IU] via SUBCUTANEOUS
  Administered 2018-05-29 – 2018-05-30 (×2): 5 [IU] via SUBCUTANEOUS
  Administered 2018-05-30 – 2018-05-31 (×2): 8 [IU] via SUBCUTANEOUS
  Administered 2018-05-31: 5 [IU] via SUBCUTANEOUS
  Administered 2018-05-31: 8 [IU] via SUBCUTANEOUS
  Administered 2018-06-01: 2 [IU] via SUBCUTANEOUS
  Administered 2018-06-01: 8 [IU] via SUBCUTANEOUS
  Administered 2018-06-01 – 2018-06-02 (×3): 5 [IU] via SUBCUTANEOUS
  Administered 2018-06-02 – 2018-06-03 (×3): 8 [IU] via SUBCUTANEOUS
  Administered 2018-06-03: 5 [IU] via SUBCUTANEOUS
  Administered 2018-06-04: 3 [IU] via SUBCUTANEOUS
  Administered 2018-06-04: 8 [IU] via SUBCUTANEOUS
  Administered 2018-06-04: 5 [IU] via SUBCUTANEOUS
  Administered 2018-06-05: 8 [IU] via SUBCUTANEOUS
  Administered 2018-06-05: 5 [IU] via SUBCUTANEOUS

## 2018-05-26 MED ORDER — METOCLOPRAMIDE HCL 5 MG/ML IJ SOLN: 5.0000 mg | Freq: Three times a day (TID) | INTRAMUSCULAR | Status: DC | PRN

## 2018-05-26 MED ORDER — FENTANYL CITRATE (PF) 100 MCG/2ML IJ SOLN
INTRAMUSCULAR | Status: DC | PRN
  Administered 2018-05-26 (×4): 50 ug via INTRAVENOUS

## 2018-05-26 MED ORDER — LIDOCAINE 2% (20 MG/ML) 5 ML SYRINGE
INTRAMUSCULAR | Status: DC | PRN
  Administered 2018-05-26: 60 mg via INTRAVENOUS

## 2018-05-26 MED ORDER — OXYCODONE HCL 5 MG PO TABS
ORAL_TABLET | ORAL | Status: AC
  Filled 2018-05-26: qty 1

## 2018-05-26 MED ORDER — HYDROCODONE-ACETAMINOPHEN 5-325 MG PO TABS
1.0000 | ORAL_TABLET | ORAL | Status: DC | PRN
  Administered 2018-05-26 – 2018-05-29 (×2): 2 via ORAL
  Administered 2018-05-30 – 2018-05-31 (×2): 1 via ORAL
  Administered 2018-05-31: 2 via ORAL
  Administered 2018-05-31: 1 via ORAL
  Filled 2018-05-26: qty 1
  Filled 2018-05-26 (×2): qty 2
  Filled 2018-05-26: qty 1
  Filled 2018-05-26 (×3): qty 2

## 2018-05-26 SURGICAL SUPPLY — 44 items
BANDAGE ACE 4X5 VEL STRL LF (GAUZE/BANDAGES/DRESSINGS) ×6 IMPLANT
BLADE SURG 15 STRL LF DISP TIS (BLADE) ×1 IMPLANT
BLADE SURG 15 STRL SS (BLADE) ×2
BNDG COHESIVE 4X5 TAN STRL (GAUZE/BANDAGES/DRESSINGS) ×3 IMPLANT
BOOTCOVER CLEANROOM LRG (PROTECTIVE WEAR) ×3 IMPLANT
CANISTER WOUND CARE 500ML ATS (WOUND CARE) ×3 IMPLANT
COVER SURGICAL LIGHT HANDLE (MISCELLANEOUS) ×3 IMPLANT
CUFF TOURNIQUET SINGLE 34IN LL (TOURNIQUET CUFF) ×3 IMPLANT
DRAPE ORTHO SPLIT 77X108 STRL (DRAPES) ×4
DRAPE SURG ORHT 6 SPLT 77X108 (DRAPES) ×2 IMPLANT
DRAPE U-SHAPE 47X51 STRL (DRAPES) ×3 IMPLANT
DRSG VAC ATS SM SENSATRAC (GAUZE/BANDAGES/DRESSINGS) ×3 IMPLANT
ELECT REM PT RETURN 9FT ADLT (ELECTROSURGICAL) ×3
ELECTRODE REM PT RTRN 9FT ADLT (ELECTROSURGICAL) ×1 IMPLANT
GLOVE BIOGEL PI ORTHO PRO SZ8 (GLOVE) ×2
GLOVE ORTHO TXT STRL SZ7.5 (GLOVE) ×3 IMPLANT
GLOVE PI ORTHO PRO STRL SZ8 (GLOVE) ×1 IMPLANT
GLOVE SURG ORTHO 8.0 STRL STRW (GLOVE) ×6 IMPLANT
GOWN STRL REUS W/ TWL LRG LVL3 (GOWN DISPOSABLE) ×1 IMPLANT
GOWN STRL REUS W/ TWL XL LVL3 (GOWN DISPOSABLE) ×1 IMPLANT
GOWN STRL REUS W/TWL 2XL LVL3 (GOWN DISPOSABLE) ×3 IMPLANT
GOWN STRL REUS W/TWL LRG LVL3 (GOWN DISPOSABLE) ×2
GOWN STRL REUS W/TWL XL LVL3 (GOWN DISPOSABLE) ×2
KIT BASIN OR (CUSTOM PROCEDURE TRAY) ×3 IMPLANT
KIT TURNOVER KIT B (KITS) ×3 IMPLANT
MANIFOLD NEPTUNE II (INSTRUMENTS) ×3 IMPLANT
NS IRRIG 1000ML POUR BTL (IV SOLUTION) ×3 IMPLANT
PACK ORTHO EXTREMITY (CUSTOM PROCEDURE TRAY) ×3 IMPLANT
PAD ARMBOARD 7.5X6 YLW CONV (MISCELLANEOUS) ×9 IMPLANT
PAD CAST 4YDX4 CTTN HI CHSV (CAST SUPPLIES) ×2 IMPLANT
PAD NEG PRESSURE SENSATRAC (MISCELLANEOUS) ×3 IMPLANT
PADDING CAST COTTON 4X4 STRL (CAST SUPPLIES) ×4
SET IRRIG Y TYPE TUR BLADDER L (SET/KITS/TRAYS/PACK) ×3 IMPLANT
SPLINT PLASTER CAST XFAST 5X30 (CAST SUPPLIES) ×1 IMPLANT
SPLINT PLASTER XFAST SET 5X30 (CAST SUPPLIES) ×2
SPONGE LAP 18X18 X RAY DECT (DISPOSABLE) ×3 IMPLANT
SUT ETHILON 3 0 PS 1 (SUTURE) ×3 IMPLANT
TOWEL OR 17X24 6PK STRL BLUE (TOWEL DISPOSABLE) ×3 IMPLANT
TOWEL OR 17X26 10 PK STRL BLUE (TOWEL DISPOSABLE) ×3 IMPLANT
TUBE CONNECTING 12'X1/4 (SUCTIONS) ×1
TUBE CONNECTING 12X1/4 (SUCTIONS) ×2 IMPLANT
UNDERPAD 30X30 (UNDERPADS AND DIAPERS) ×3 IMPLANT
WATER STERILE IRR 1000ML POUR (IV SOLUTION) ×3 IMPLANT
YANKAUER SUCT BULB TIP NO VENT (SUCTIONS) ×3 IMPLANT

## 2018-05-26 NOTE — Anesthesia Preprocedure Evaluation (Addendum)
Anesthesia Evaluation  Patient identified by MRN, date of birth, ID band Patient awake    Reviewed: Allergy & Precautions, NPO status , Patient's Chart, lab work & pertinent test results  Airway Mallampati: II  TM Distance: >3 FB Neck ROM: Full    Dental  (+) Upper Dentures, Lower Dentures   Pulmonary former smoker,    Pulmonary exam normal breath sounds clear to auscultation       Cardiovascular hypertension, Pt. on medications Normal cardiovascular exam Rhythm:Regular Rate:Normal  ECG: rate 89. Normal sinus rhythm Incomplete right bundle branch block   Neuro/Psych residual right side weakness CVA, Residual Symptoms negative psych ROS   GI/Hepatic negative GI ROS, Neg liver ROS,   Endo/Other  diabetes, Insulin Dependent  Renal/GU CRFRenal disease     Musculoskeletal negative musculoskeletal ROS (+)   Abdominal   Peds  Hematology  (+) anemia , HLD   Anesthesia Other Findings INFECTED LEFT ANKLE FRACTURE  Reproductive/Obstetrics                            Anesthesia Physical Anesthesia Plan  ASA: III  Anesthesia Plan: General   Post-op Pain Management:    Induction: Intravenous  PONV Risk Score and Plan: 3 and Midazolam, Dexamethasone, Ondansetron and Treatment may vary due to age or medical condition  Airway Management Planned: LMA  Additional Equipment:   Intra-op Plan:   Post-operative Plan: Extubation in OR  Informed Consent: I have reviewed the patients History and Physical, chart, labs and discussed the procedure including the risks, benefits and alternatives for the proposed anesthesia with the patient or authorized representative who has indicated his/her understanding and acceptance.   Dental advisory given  Plan Discussed with: CRNA  Anesthesia Plan Comments:        Anesthesia Quick Evaluation

## 2018-05-26 NOTE — H&P (Signed)
PREOPERATIVE H&P  Chief Complaint: Left ankle infection  HPI: Cesar Ali is a 52 y.o. male who presents for preoperative history and physical with a diagnosis of left ankle infection, he had an open reduction internal fixation done about 2 months ago by Dr. Eulah PontMurphy.  The wound apparently has dehisced, there is hardware exposed.  He was in an outside emergency room last night, about 4 hours away, and I [facility].  He has minimal pain.  He reports having sensation intact in his feet.  He also had some type of an ultrasound work-up which apparently was negative for DVT.   Past Medical History:  Diagnosis Date  . Ankle fracture, left   . Anticoagulant long-term use    plavix  . CKD (chronic kidney disease), stage III [facility](HCC)    nephrologist-- dr Hyman Hopeswebb  . Full dentures   . History of CVA with residual deficit 2010   x3  march 2010;  sept 2010;  oct 2010---  residual right side weakness, per pt does not need cane  . Hyperlipidemia   . Hypertension   . Insulin dependent type 1 diabetes mellitus (HCC)    followed by pcp-- dr Greggory Stalliongeorge kilpatrick   Past Surgical History:  Procedure Laterality Date  . CLOSED REDUCTION FINGER WITH PERCUTANEOUS PINNING Right 2003   left ring finger  . MULTIPLE TOOTH EXTRACTIONS  03-22-2011   dr Teola Bradleyjenson @[facility]   . ORIF ANKLE FRACTURE Left 04/03/2018   Procedure: OPEN REDUCTION INTERNAL FIXATION (ORIF)  LEFT ANKLE FRACTURE;  Surgeon: Sheral ApleyMurphy, Timothy D, MD;  [facility] ;  Service: Orthopedics;  Laterality: Left;  . PARS PLANA VITRECTOMY  02/01/2012   Procedure: PARS PLANA VITRECTOMY WITH 23 GAUGE;  Surgeon: Shade FloodGreer Geiger, MD;  [facility];  Service: Ophthalmology;  Laterality: Left;   Social History   Socioeconomic History  . Marital status: Single    Spouse name: Not on file  . Number of children: Not on file  . Years of education: Not on file  . Highest education level: Not on  file  Occupational History  . Not on file  Social Needs  . Financial resource strain: Not on file  . Food insecurity:    Worry: Not on file    Inability: Not on file  . Transportation needs:    Medical: Not on file    Non-medical: Not on file  Tobacco Use  . Smoking status: Former Smoker    Years: 10.00    Types: Cigarettes    Last attempt to quit: 03/30/2003    Years since quitting: 15.1  . Smokeless tobacco: Never Used  Substance and Sexual Activity  . Alcohol use: Not Currently  . Drug use: Not Currently    Comment: 03-29-2018 per pt last used cocaine 2004  . Sexual activity: Not on file  Lifestyle  . Physical activity:    Days per week: Not on file    Minutes per session: Not on file  . Stress: Not on file  Relationships  . Social connections:    Talks on phone: Not on file    Gets together: Not on file    Attends religious service: Not on file    Active member of club or organization: Not on file    Attends meetings of clubs or organizations: Not on file    Relationship status: Not on file  Other Topics Concern  . Not on file  Social History  Narrative  . Not on file   History reviewed. No pertinent family history. No Known Allergies Prior to Admission medications   Medication Sig Start Date End Date Taking? Authorizing Provider  amLODipine (NORVASC) 10 MG tablet Take 10 mg by mouth every morning.    Yes [provider]  aspirin EC 81 MG tablet Take 81 mg by mouth daily.   Yes [provider]  atorvastatin (LIPITOR) 10 MG tablet Take 10 mg by mouth every morning.    Yes [provider]  cephALEXin (KEFLEX) 500 MG capsule Take 1,000 mg by mouth 2 (two) times daily.   Yes [provider]  clopidogrel (PLAVIX) 75 MG tablet Take 75 mg by mouth every morning.    Yes [provider]  furosemide (LASIX) 40 MG tablet Take 40 mg by mouth every morning.    Yes [provider]  gabapentin (NEURONTIN) 300 MG capsule Take  1 capsule (300 mg total) by mouth 2 (two) times daily for 14 days. For 2 weeks post op for pain. 04/03/18 05/26/18 Yes Martensen, Lucretia Kern III, PA-C  insulin regular (NOVOLIN R,HUMULIN R) 100 units/mL injection Inject into the skin 2 (two) times daily before a meal. Per sliding scale. 200-250= 2 units, 251-300= 4 units, 301-350= 6 units, 351-400= 8 units.    Yes [provider]  LEVEMIR 100 UNIT/ML injection Inject 25 Units into the skin 2 (two) times daily. 03/14/18  Yes [provider]  lisinopril (PRINIVIL,ZESTRIL) 10 MG tablet Take 10 mg by mouth every morning.    Yes [provider]  quinapril (ACCUPRIL) 10 MG tablet Take 10 mg by mouth every morning.    Yes [provider]  sulfamethoxazole-trimethoprim (BACTRIM DS,SEPTRA DS) 800-160 MG tablet Take 1 tablet by mouth 2 (two) times daily.   Yes [provider]  docusate sodium (COLACE) 100 MG capsule Take 1 capsule (100 mg total) by mouth 2 (two) times daily. To prevent constipation while taking pain medication. Patient not taking: Reported on 05/26/2018 04/03/18   Albina Billet III, PA-C  methocarbamol (ROBAXIN) 500 MG tablet Take 1 tablet (500 mg total) by mouth every 8 (eight) hours as needed for muscle spasms. 04/03/18   Martensen, Lucretia Kern III, PA-C  ondansetron (ZOFRAN) 4 MG tablet Take 1 tablet (4 mg total) by mouth every 8 (eight) hours as needed for nausea or vomiting. 04/03/18   Albina Billet III, PA-C     Positive ROS: All other systems have been reviewed and were otherwise negative with the exception of those mentioned in the HPI and as above.  Physical Exam: General: Alert, no acute distress Cardiovascular: No pedal edema Respiratory: No cyanosis, no use of accessory musculature GI: No organomegaly, abdomen is soft and non-tender Skin: He has an open wound over the lateral side of his ankle with exposed hardware. Neurologic: Sensation intact distally,  although I am not sure that it is normal. Psychiatric: Patient is competent for consent with normal mood and affect Lymphatic: No axillary or cervical lymphadenopathy  MUSCULOSKELETAL: Left ankle EHL and FHL are intact, I have difficulty palpating a dorsalis pedis pulse  Assessment: Left ankle retained hardware, with infection, status post open reduction internal fixation   Plan: Plan for Procedure(s): IRRIGATION AND DEBRIDEMENT, LEFT ANKLE HARDWARE REMOVAL, LEFT ANKLE  The risks benefits and alternatives were discussed with the patient including but not limited to the risks of nonoperative treatment, versus surgical intervention including infection, bleeding, nerve injury,  blood clots, cardiopulmonary complications,  morbidity, mortality, potential for amputation, among others, and they were willing to proceed.   Anticipated LOS equal to or greater than 2 midnights due to - Age 24 and older with one or more of the following:  - Obesity  - Expected need for hospital services (PT, OT, Nursing) required for safe  discharge  - Anticipated need for postoperative skilled nursing care or inpatient rehab  - Active co-morbidities: Diabetes and Stroke he also has chronic kidney disease, and is going to need IV antibiotics and potentially multiple operations.   Eulas Post, MD Cell [phone]   05/26/2018 12:09 PM

## 2018-05-26 NOTE — Progress Notes (Signed)
Pharmacy Antibiotic Note  Cesar Ali is a 52 y.o. male admitted on 05/26/2018 with osteomyelitis. He sustained a mechanical fall on 10/14 and developed L trimalleolar ankle fracture. He underwent ORIF on 10/22 and started to note drainage on 11/13. He was started on PO antibiotics and sent to wound care for management. The wound continued to have dehiscence, purulent drainage, and mild erythema around the wound. He is now s/p L ankle I&D and removal of hardware on 12/14. Cultures are pending. ID is following.   Pharmacy has been consulted for vancomycin dosing. Of note, patient received vancomycin 1g IV intra-op at ~1330 today. He is also on ceftriaxone per MD. WBC 12.5, currently AF. Scr 1.79 (appears near BL), estimated CrCl ~50 mL/min.  Plan: Vancomycin 1250mg  IV q24h (estimated AUC ~514, goal 400-550) Continue ceftriaxone 2g IV q24h F/u C&S, clinical status, renal function, de-escalation, LOT, vancomycin levels as appropriate  Height: 5' 11.5" (181.6 cm) Weight: 161 lb (73 kg) IBW/kg (Calculated) : 76.45  Temp (24hrs), Avg:98.2 F (36.8 C), Min:98.1 F (36.7 C), Max:98.3 F (36.8 C)  Recent Labs  Lab 05/26/18 1629  WBC 12.5*    CrCl cannot be calculated (Patient's most recent lab result is older than the maximum 21 days allowed.).    No Known Allergies  Antimicrobials this admission: Vancomycin 12/14 >>  Ceftriaxone 12/14 >>   Microbiology results: None  Thank you for allowing pharmacy to be a part of this patient's care.  Roderic ScarceErin N. Zigmund Danieleja, PharmD PGY2 Infectious Diseases Pharmacy Resident Phone: [phone] 05/26/2018 5:06 PM

## 2018-05-26 NOTE — Discharge Instructions (Signed)

## 2018-05-26 NOTE — Anesthesia Procedure Notes (Signed)
Procedure Name: LMA Insertion Date/Time: 05/26/2018 12:49 PM Performed by: Shireen QuanButler, Raahim Shartzer R, CRNA Pre-anesthesia Checklist: Patient identified, Emergency Drugs available, Suction available and Patient being monitored Patient Re-evaluated:Patient Re-evaluated prior to induction Oxygen Delivery Method: Circle System Utilized Preoxygenation: Pre-oxygenation with 100% oxygen Induction Type: IV induction Ventilation: Mask ventilation without difficulty LMA: LMA inserted LMA Size: 5.0 Number of attempts: 1 Airway Equipment and Method: Bite block Placement Confirmation: positive ETCO2 Tube secured with: Tape Dental Injury: Teeth and Oropharynx as per pre-operative assessment

## 2018-05-26 NOTE — Progress Notes (Signed)
Paged MD concerning wound vac tubing. The tubing on the right side has sanguineous drainage in the tubing and does not appear to be flowing into the canister. This RN was concerned about clotting in the tubing. The wound vac itself is working properly. Pt complained of increased pain. Readjusted pillows under pt's leg. Spoke with Dr. Dion SaucierLandau and he stated to treat pain with ordered pain medications; no concerns about tubing. Will continue to monitor and intervene accordingly.

## 2018-05-26 NOTE — Anesthesia Postprocedure Evaluation (Signed)
Anesthesia Post Note  Patient: Audelia ActonJohn D Yogi  Procedure(s) Performed: IRRIGATION AND DEBRIDEMENT, LEFT ANKLE (Left ) HARDWARE REMOVAL, LEFT ANKLE (Left )     Patient location during evaluation: PACU Anesthesia Type: General Level of consciousness: awake and alert Pain management: pain level controlled Vital Signs Assessment: post-procedure vital signs reviewed and stable Respiratory status: spontaneous breathing, nonlabored ventilation, respiratory function stable and patient connected to nasal cannula oxygen Cardiovascular status: blood pressure returned to baseline and stable Postop Assessment: no apparent nausea or vomiting Anesthetic complications: no    Last Vitals:  Vitals:   05/26/18 1608 05/26/18 1931  BP: (!) 146/88 (!) 147/93  Pulse: 91 93  Resp: 20 18  Temp: 36.8 C (!) 36.4 C  SpO2: 99% 100%    Last Pain:  Vitals:   05/26/18 1931  TempSrc: Oral  PainSc:                  Ryan P Ellender

## 2018-05-26 NOTE — Op Note (Signed)
05/26/2018  2:06 PM  PATIENT:  Cesar Ali    PRE-OPERATIVE DIAGNOSIS: Infected left distal fibula and syndesmosis  POST-OPERATIVE DIAGNOSIS:  Same  PROCEDURE:    1.  Left ankle incision, irrigation, debridement, skin, subcutaneous tissue, bone 2.  Left ankle removal of hardware, deep, including fibular plate and syndesmotic button fixation from both the lateral and medial side 3.  Application of small wound VAC, left lateral ankle 4.  2 views of the left ankle taken intraoperatively using the small C arm demonstrated retained hardware, and allowed for localization of the medial button.  SURGEON:  Eulas Post, MD  PHYSICIAN ASSISTANT: Janace Litten, OPA-C, present and scrubbed throughout the case, critical for completion in a timely fashion, and for retraction, instrumentation, and closure.  ANESTHESIA:   General  PREOPERATIVE INDICATIONS:  [name] is a  52 y.o. male who had a left distal fibula fracture with syndesmotic injury that underwent open reduction internal fixation performed by Dr. Margarita Rana about 2 months ago.  He presented to an outside emergency room with drainage and ulceration and exposed fibular plate.  The risks benefits and alternatives were discussed with the patient preoperatively including but not limited to the risks of infection, bleeding, nerve injury, cardiopulmonary complications, the need for revision surgery, the potential need for amputation, among others, and the patient was willing to proceed.  ESTIMATED BLOOD LOSS: 100 mL  OPERATIVE IMPLANTS: I removed an Arthrex one third tubular locking plate with a posterior to anterior lag screw, as well as button syndesmotic fixation on either side  OPERATIVE FINDINGS: The fracture did not appear healed.  There was some degree of tracking purulence into the syndesmosis.  The screw fixation from the lag screw had been somewhat loosened, and there was a fragmented lateral piece of bone.  There  was skin necrosis, and breakdown, with a sinus tract.  OPERATIVE PROCEDURE: The patient was brought to the operating room and placed in the supine position.  General anesthesia was administered.  IV antibiotics were held until after intraoperative cultures were taken.  The left lower extremity was prepped and draped in usual sterile fashion.  Timeout performed.  Incision was made through his previous incision and I used a scalpel to excise the fairly large sinus tract anteriorly and posteriorly along the lateral side of the plate.  There did appear to be some degree of skin necrosis and even may be even some skin loss.  This was excised full-thickness.  I removed any necrotic appearing tissue with a rondure as well as a scalpel, and a curette was also used along with a Cobb elevator to debride the soft tissue.  I localized the button medially using C arm, made an incision over the medial side, removed the medial button, and then from the lateral side was able to deliver out the sutures from the syndesmosis fixation device.  There was a little bit of purulence medially, or at least it did not look totally normal, I was concerned about it.  All the screws were removed from the lateral side, the plate was removed, I debrided the bone with a rongure, I also remove the lag screw.  I removed a cortical piece of bone that was fragmented and devitalized.  I then irrigated a total of 9 L through both the medial and lateral side, completed the debridement, and placed a single suture over the medial wound, and then a wound VAC over the lateral side.  The patient was awakened  and returned to the PACU in stable and satisfactory condition.  There were no complications and he tolerated the procedure well.

## 2018-05-26 NOTE — Progress Notes (Signed)
Dr. Bradley FerrisEllender notified that patient is a Type 1 DM and has not taken any insulin since yesterday. Notified of CBG 192

## 2018-05-26 NOTE — Consult Note (Signed)
[facility]  Total days of antibiotics 1/vanco               Reason for Consult:left ankle HW infection    Referring Physician: landau  Principal Problem:   Acute osteomyelitis of left fibula [facility])    HPI: Cesar Ali is a 52 y.o. male with CKD 3, hx of CVA, IDDM, who sustained mechanical fall on 10/14 and developed left  trimalleolar ankle fracture. He underwent ORIF on 10/22 by dr Eulah Pont. His ankle started to have drainage noted on 11/13 where he was started on oral abtx and sent to wound care for evaluation and management of tissue breakdown but no report of fever or chills. The wound continued to have dehiscence with HW exposure. Patient states that it had purulent drainage mild erythema around the wound. Denies fever,chills, nightsweats, though has had weight loss and loss of appetite. He was transferred from outside ED to [facility] for further management. Dr Dion Saucier took him to the OR on 10/14 for left ankle IX D, removal of HW- including deep fibular plate and syndesmotic button fixation from both lateral and medial side- all HW removed. ID asked to weigh in on abtx choice and management. Cultures are pending   Past Medical History:  Diagnosis Date  . Acute osteomyelitis of left fibula (HCC) 05/26/2018  . Ankle fracture, left   . Anticoagulant long-term use    plavix  . CKD (chronic kidney disease), stage III [facility])    nephrologist-- dr Hyman Hopes  . Full dentures   . History of CVA with residual deficit 2010   x3  march 2010;  sept 2010;  oct 2010---  residual right side weakness, per pt does not need cane  . Hyperlipidemia   . Hypertension   . Insulin dependent type 1 diabetes mellitus (HCC)    followed by pcp-- dr Greggory Stallion kilpatrick    Allergies: No Known Allergies   MEDICATIONS: . insulin aspart      . chlorhexidine  60 mL Topical Once  . HYDROmorphone      . insulin aspart      . insulin aspart  0-15 Units Subcutaneous TID WC  . insulin aspart   5 Units Subcutaneous Once  . methocarbamol      . oxyCODONE      . povidone-iodine  2 application Topical Once    Social History   Tobacco Use  . Smoking status: Former Smoker    Years: 10.00    Types: Cigarettes    Last attempt to quit: 03/30/2003    Years since quitting: 15.1  . Smokeless tobacco: Never Used  Substance Use Topics  . Alcohol use: Not Currently  . Drug use: Not Currently    Comment: 03-29-2018 per pt last used cocaine 2004    History reviewed. No pertinent family history.   Review of Systems  Constitutional: positive for 20lb weight loss in the past 2 months. Negative for fever, chills, diaphoresis, activity change, appetite change, fatigue and HENT: Negative for congestion, sore throat, rhinorrhea, sneezing, trouble swallowing and sinus pressure.  Eyes: Negative for photophobia and visual disturbance.  Respiratory: Negative for cough, chest tightness, shortness of breath, wheezing and stridor.  Cardiovascular: Negative for chest pain, palpitations and leg swelling.  Gastrointestinal: Negative for nausea, vomiting, abdominal pain, diarrhea, constipation, blood in stool, abdominal distention and anal bleeding.  Genitourinary: Negative for dysuria, hematuria, flank pain and difficulty urinating.  Musculoskeletal:+left ankle pain and wound. Negative for myalgias,  back pain, joint swelling, arthralgias and gait problem.  Skin:+ wound development.  Negative for color change, pallor, rash  Neurological: Negative for dizziness, tremors, weakness and light-headedness.  Hematological: Negative for adenopathy. Does not bruise/bleed easily.  Psychiatric/Behavioral: Negative for behavioral problems, confusion, sleep disturbance, dysphoric mood, decreased concentration and agitation.     OBJECTIVE: Temp:  [98.1 F (36.7 C)-98.3 F (36.8 C)] 98.1 F (36.7 C) (12/14 1430) Pulse Rate:  [80-89] 87 (12/14 1458) Resp:  [11-16] 14 (12/14 1458) BP: (139-153)/(82-96) 153/94  (12/14 1458) SpO2:  [100 %] 100 % (12/14 1458) Weight:  [73 kg] 73 kg (12/14 1129) Physical Exam  Constitutional: He is oriented to person, place, and time. He appears well-developed and well-nourished. No distress.  HENT: mild scleral icterus Mouth/Throat: Oropharynx is clear and moist. No oropharyngeal exudate.  Cardiovascular: Normal rate, regular rhythm and normal heart sounds. Exam reveals no gallop and no friction rub.  No murmur heard.  Pulmonary/Chest: Effort normal and breath sounds normal. No respiratory distress. He has no wheezes.  Abdominal: Soft. Bowel sounds are normal. He exhibits no distension. There is no tenderness.  Lymphadenopathy:  He has no cervical adenopathy.  Neurological: He is alert and oriented to person, place, and time.  Skin: Skin is warm and dry. No rash noted. No erythema.  EAV:WUJWExt:left ankle wound vac in place Psychiatric: He has a normal mood and affect. His behavior is normal.    LABS: Results for orders placed or performed during the hospital encounter of 05/26/18 (from the past 48 hour(s))  Glucose, capillary     Status: Abnormal   Collection Time: 05/26/18 11:37 AM  Result Value Ref Range   Glucose-Capillary 192 (H) 70 - 99 mg/dL  Glucose, capillary     Status: Abnormal   Collection Time: 05/26/18  2:39 PM  Result Value Ref Range   Glucose-Capillary 241 (H) 70 - 99 mg/dL  Glucose, capillary     Status: Abnormal   Collection Time: 05/26/18  3:26 PM  Result Value Ref Range   Glucose-Capillary 284 (H) 70 - 99 mg/dL    MICRO: pending IMAGING: No results found.   Assessment/Plan:  Left ankle osteomyelitis with HW involvement s/p HW removal.  - treat as osteomyelitis and plan to do 6 wks of IV abtx - will get sed rate and crp - will recommend iv vancomycin plus cefepime for now - will get picc line - will check mrsa colonization   - will check hiv and hep C as part of health maintenance  Weight loss = will recommend nutritions to see  while he is hospitalized to see if he needs protein supplementaiton

## 2018-05-26 NOTE — Transfer of Care (Signed)
Immediate Anesthesia Transfer of Care Note  Patient: Cesar ActonJohn D Ali  Procedure(s) Performed: IRRIGATION AND DEBRIDEMENT, LEFT ANKLE (Left ) HARDWARE REMOVAL, LEFT ANKLE (Left )  Patient Location: PACU  Anesthesia Type:General  Level of Consciousness: awake, alert  and oriented  Airway & Oxygen Therapy: Patient Spontanous Breathing and Patient connected to nasal cannula oxygen  Post-op Assessment: Report given to RN, Post -op Vital signs reviewed and stable and Patient moving all extremities  Post vital signs: Reviewed and stable  Last Vitals:  Vitals Value Taken Time  BP 153/103 05/26/2018  2:29 PM  Temp    Pulse 78 05/26/2018  2:31 PM  Resp 12 05/26/2018  2:31 PM  SpO2 87 % 05/26/2018  2:31 PM  Vitals shown include unvalidated device data.  Last Pain:  Vitals:   05/26/18 1129  TempSrc: Oral  PainSc: 1       Patients Stated Pain Goal: 3 (05/26/18 1129)  Complications: No apparent anesthesia complications

## 2018-05-27 ENCOUNTER — Encounter (HOSPITAL_COMMUNITY): Payer: Self-pay | Admitting: Orthopedic Surgery

## 2018-05-27 ENCOUNTER — Other Ambulatory Visit: Payer: Self-pay

## 2018-05-27 LAB — HEPATITIS C ANTIBODY: HCV Ab: <0.1 s/co ratio (ref 0.0–0.9)

## 2018-05-27 LAB — CBC WITH DIFFERENTIAL/PLATELET
Abs Immature Granulocytes: 0.04 10*3/uL (ref 0.00–0.07)
BASOS ABS: 0 10*3/uL (ref 0.0–0.1)
Basophils Relative: 0 %
Eosinophils Absolute: 0 10*3/uL (ref 0.0–0.5)
Eosinophils Relative: 0 %
HCT: 32.5 % — ABNORMAL LOW (ref 39.0–52.0)
Hemoglobin: 9.8 g/dL — ABNORMAL LOW (ref 13.0–17.0)
IMMATURE GRANULOCYTES: 0 %
Lymphocytes Relative: 5 %
Lymphs Abs: 0.6 10*3/uL — ABNORMAL LOW (ref 0.7–4.0)
MCH: 26.1 pg (ref 26.0–34.0)
MCHC: 30.2 g/dL (ref 30.0–36.0)
MCV: 86.4 fL (ref 80.0–100.0)
Monocytes Absolute: 0.1 10*3/uL (ref 0.1–1.0)
Monocytes Relative: 1 %
NEUTROS ABS: 10.4 10*3/uL — AB (ref 1.7–7.7)
Neutrophils Relative %: 94 %
Platelets: 473 10*3/uL — ABNORMAL HIGH (ref 150–400)
RBC: 3.76 MIL/uL — ABNORMAL LOW (ref 4.22–5.81)
RDW: 13 % (ref 11.5–15.5)
WBC: 11.1 10*3/uL — ABNORMAL HIGH (ref 4.0–10.5)
nRBC: 0 % (ref 0.0–0.2)

## 2018-05-27 LAB — GLUCOSE, CAPILLARY
Glucose-Capillary: 298 mg/dL — ABNORMAL HIGH (ref 70–99)
Glucose-Capillary: 308 mg/dL — ABNORMAL HIGH (ref 70–99)

## 2018-05-27 LAB — HIV ANTIBODY (ROUTINE TESTING W REFLEX): HIV Screen 4th Generation wRfx: NONREACTIVE

## 2018-05-27 MED ORDER — VANCOMYCIN HCL 10 G IV SOLR
1250.0000 mg | INTRAVENOUS | Status: DC
  Administered 2018-05-27 – 2018-06-04 (×8): 1250 mg via INTRAVENOUS
  Filled 2018-05-27 (×10): qty 1250

## 2018-05-27 NOTE — Evaluation (Signed)
Physical Therapy Evaluation Patient Details Name: Cesar Ali MRN: [id number] DOB: Sep 10, 1965 Today's Date: 05/27/2018   History of Present Illness  Cesar Ali is a 52 y.o. male with CKD 3, hx of CVA, IDDM, who sustained mechanical fall on 10/14 and developed left  trimalleolar ankle fracture. He underwent ORIF on 10/22. His ankle started to have drainage noted on 11/13 where he was started on oral abtx and sent to wound care for evaluation and management of tissue breakdown but no report of fever or chills. The wound continued to have dehiscence with HW exposure, and purulent drainage.  He was transferred from outside ED to [facility] Dr Dion Saucier took him to the OR on 10/14 for left ankle IX D, removal of HW- including deep fibular plate and syndesmotic button fixation from both lateral and medial side- all HW removed, now NWB LLE.  Clinical Impression   Patient is s/p above surgery resulting in functional limitations due to the deficits listed below (see PT Problem List). Managing independently prior to admission with crutches and /or RW (unsure if he was able to maintain NWB status); Presents with decr functional mobility, and significant difficulty keeping weight off of LLE with use of crutches; Will plan to work with RW over next few session to see if that AD helps better to keep LLE NWB; He may need to dc at wheelchair level if he can't keep NWB LLE; Will need lots of options and problem-solving for mobility and ADLs to go home at [facility] level with stairs and no bathroom on main level; not sure that his insurance pays for it, but strongly recommend HH therapies at dc; Patient will benefit from skilled PT to increase their independence and safety with mobility to allow discharge to the venue listed below.       Follow Up Recommendations Home health PT;Supervision/Assistance - 24 hour    Equipment Recommendations  Wheelchair (measurements PT);Wheelchair cushion (measurements PT);3in1  (PT)(consider drop-arm BSC -- will defer to OT)    Recommendations for Other Services OT consult(ordered per protocol)     Precautions / Restrictions Precautions Precautions: Fall Restrictions LLE Weight Bearing: Non weight bearing      Mobility  Bed Mobility Overal bed mobility: Needs Assistance Bed Mobility: Supine to Sit     Supine to sit: Supervision     General bed mobility comments: Supervision for lines  Transfers Overall transfer level: Needs assistance Equipment used: Crutches Transfers: Sit to/from Stand Sit to Stand: Min assist         General transfer comment: Min assist to rise and steady; min assist also for crutch management; Not a lot of assist needed, but unfortunately noted pt put a considerable amount of weight through LLE  Ambulation/Gait Ambulation/Gait assistance: Min assist Gait Distance (Feet): 5 Feet Assistive device: Crutches Gait Pattern/deviations: Step-to pattern     General Gait Details: Noting LLE accepting weight with every step; Pt tells me it's minimal, but it looks like he is taking a considerable amount of weight on LLE  Stairs            Wheelchair Mobility    Modified Rankin (Stroke Patients Only)       Balance Overall balance assessment: Needs assistance           Standing balance-Leahy Scale: Poor Standing balance comment: Needs Bil UE support  Pertinent Vitals/Pain Pain Assessment: No/denies pain    Home Living Family/patient expects to be discharged to:: Private residence Living Arrangements: Other relatives Available Help at Discharge: Family;Available PRN/intermittently(Brother) Type of Home: House(split level) Home Access: Stairs to enter Entrance Stairs-Rails: None Entrance Stairs-Number of Steps: 3-5 Home Layout: Multi-level;Bed/bath upstairs Home Equipment: Walker - 2 wheels;Crutches Additional Comments: Not clear exactly how much help is available to  Cesar Ali at home    Prior Function Level of Independence: Independent with assistive device(s)         Comments: He tells me he was independent with RW/crutches since the ankle fracture in October; I'm not sure this is the case, given poor use of crutches on PT eval 12/15     Hand Dominance        Extremity/Trunk Assessment   Upper Extremity Assessment Upper Extremity Assessment: Defer to OT evaluation(previous CVA effecting r side)    Lower Extremity Assessment Lower Extremity Assessment: Generalized weakness;LLE deficits/detail LLE Deficits / Details: Lower leg wrapped and with VAC dressing; positive active toe wiggle, able to perform straight leg raise       Communication   Communication: No difficulties  Cognition Arousal/Alertness: Awake/alert Behavior During Therapy: WFL for tasks assessed/performed Overall Cognitive Status: No family/caregiver present to determine baseline cognitive functioning Area of Impairment: Safety/judgement                         Safety/Judgement: Decreased awareness of safety;Decreased awareness of deficits     General Comments: [name]      General Comments      Exercises     Assessment/Plan    PT Assessment Patient needs continued PT services  PT Problem List Decreased strength;Decreased range of motion;Decreased activity tolerance;Decreased balance;Decreased mobility;Decreased cognition;Decreased knowledge of precautions;Decreased knowledge of use of DME;Decreased safety awareness       PT Treatment Interventions DME instruction;Gait training;Stair training;Functional mobility training;Therapeutic activities;Therapeutic exercise;Balance training;Neuromuscular re-education;Cognitive remediation;Patient/family education;Wheelchair mobility training    PT Goals (Current goals can be found in the Care Plan section)  Acute Rehab PT Goals Patient Stated  Goal: sleepy, but agreeable to amb PT Goal Formulation: With patient Time For Goal Achievement: 06/10/18 Potential to Achieve Goals: Fair Additional Goals Additional Goal #1: Pt will independently manage with wheelchair, including parts management and propulsion (600 feet) and turns    Frequency Min 6X/week   Barriers to discharge Inaccessible home environment;Decreased caregiver support Steps to enter home, no rails; steps to bedroom and bathroom, 1 rail    Co-evaluation               AM-PAC PT "6 Clicks" Mobility  Outcome Measure Help needed turning from your back to your side while in a flat bed without using bedrails?: None Help needed moving from lying on your back to sitting on the side of a flat bed without using bedrails?: None Help needed moving to and from a bed to a chair (including a wheelchair)?: A Little Help needed standing up from a chair using your arms (e.g., wheelchair or bedside chair)?: A Little Help needed to walk in hospital room?: A Lot Help needed climbing 3-5 steps with a railing? : Total 6 Click Score: 17    End of Session Equipment Utilized During Treatment: Gait belt Activity Tolerance: Patient tolerated treatment well Patient left: in chair;with call bell/phone within reach Nurse Communication: Mobility status PT Visit Diagnosis: Unsteadiness  on feet (R26.81);Other abnormalities of gait and mobility (R26.89);Difficulty in walking, not elsewhere classified (R26.2)    Time: 1610-9604 PT Time Calculation (min) (ACUTE ONLY): 23 min   Charges:   PT Evaluation $PT Eval Moderate Complexity: 1 Mod PT Treatments $Gait Training: 8-22 mins        Van Clines, PT  Acute Rehabilitation Services Pager [phone] Office [phone]   [name] 05/27/2018, 4:10 PM

## 2018-05-27 NOTE — Progress Notes (Addendum)
Patient ID: Audelia ActonJohn D Schlink, male   DOB: December 17, 1965, 52 y.o.   MRN: [id number]     Subjective:  Patient reports pain as mild to moderate.  Patient in bed and in no acute distress denies CP or SOB.  Patient also denies fever or chills  Objective:   VITALS:   Vitals:   05/26/18 1931 05/26/18 2354 05/27/18 0424 05/27/18 0857  BP: (!) 147/93 (!) 172/99 (!) 166/91 (!) 141/95  Pulse: 93 91 89 88  Resp: 18 18 15 17   Temp: (!) 97.5 F (36.4 C) 98 F (36.7 C) 98.2 F (36.8 C) 98 F (36.7 C)  TempSrc: Oral Oral Oral Oral  SpO2: 100% 100% 100% 100%  Weight:      Height:        ABD soft Sensation intact distally Dorsiflexion/Plantar flexion intact Incision: dressing C/D/I and moderate drainage Wound vac in place   Lab Results  Component Value Date   WBC 11.1 (H) 05/27/2018   HGB 9.8 (L) 05/27/2018   HCT 32.5 (L) 05/27/2018   MCV 86.4 05/27/2018   PLT 473 (H) 05/27/2018   BMET    Component Value Date/Time   NA 141 02/01/2012 1230   K 4.7 02/01/2012 1230   CL 106 02/01/2012 1230   CO2 27 02/01/2012 1230   GLUCOSE 180 (H) 02/01/2012 1230   BUN 20 02/01/2012 1230   CREATININE 1.79 (H) 05/26/2018 1629   CALCIUM 9.8 02/01/2012 1230   GFRNONAA 43 (L) 05/26/2018 1629   GFRAA 50 (L) 05/26/2018 1629     Assessment/Plan: 1 Day Post-Op   Principal Problem:   Acute osteomyelitis of left fibula (HCC)   Advance diet Up with therapy Plan for OR again on Monday or Tuesday with Dr Eulah PontMurphy NWB left lower ext Splint at all times   Nelda SevereBrandon D Parry 05/27/2018, 11:59 AM  Discussed, seen and agree with above.  Plan for PICC line, IV antibiotics, repeat I&D with Dr. Eulah PontMurphy.  Teryl LucyJoshua Marlaine Arey, MD Cell [phone]

## 2018-05-27 NOTE — Progress Notes (Signed)
Physical Therapy Note  Patient suffers from Osteomyelitis L ankle, now s/p I& D and hardware removal, NWB LLE,  which impairs their ability to perform daily activities like mobility, ambulating, bathing, bathroom activities in the home.  A walker alone will not resolve the issues with performing activities of daily living. A lightweight wheelchair will allow patient to safely perform daily activities.  The patient can self propel in the home or has a caregiver who can provide assistance.     Cesar Ali, [place]  Acute Rehabilitation Services Pager [phone] Office [phone]

## 2018-05-27 NOTE — Plan of Care (Signed)

## 2018-05-28 ENCOUNTER — Inpatient Hospital Stay (HOSPITAL_COMMUNITY): Payer: Medicaid Other

## 2018-05-28 ENCOUNTER — Encounter (HOSPITAL_COMMUNITY): Payer: Self-pay | Admitting: Interventional Radiology

## 2018-05-28 DIAGNOSIS — M86162 Other acute osteomyelitis, left tibia and fibula: Secondary | ICD-10-CM

## 2018-05-28 DIAGNOSIS — Z978 Presence of other specified devices: Secondary | ICD-10-CM

## 2018-05-28 HISTORY — PX: IR US GUIDE VASC ACCESS RIGHT: IMG2390

## 2018-05-28 HISTORY — PX: IR FLUORO GUIDE CV LINE RIGHT: IMG2283

## 2018-05-28 LAB — BASIC METABOLIC PANEL
Anion gap: 10 (ref 5–15)
BUN: 25 mg/dL — AB (ref 6–20)
CO2: 25 mmol/L (ref 22–32)
Calcium: 8.8 mg/dL — ABNORMAL LOW (ref 8.9–10.3)
Chloride: 100 mmol/L (ref 98–111)
Creatinine, Ser: 1.83 mg/dL — ABNORMAL HIGH (ref 0.61–1.24)
GFR calc Af Amer: 48 mL/min — ABNORMAL LOW (ref >60)
GFR calc non Af Amer: 42 mL/min — ABNORMAL LOW (ref >60)
Glucose, Bld: 243 mg/dL — ABNORMAL HIGH (ref 70–99)
Potassium: 5.4 mmol/L — ABNORMAL HIGH (ref 3.5–5.1)
Sodium: 135 mmol/L (ref 135–145)

## 2018-05-28 LAB — CBC
HCT: 27 % — ABNORMAL LOW (ref 39.0–52.0)
Hemoglobin: 8.3 g/dL — ABNORMAL LOW (ref 13.0–17.0)
MCH: 26.2 pg (ref 26.0–34.0)
MCHC: 30.7 g/dL (ref 30.0–36.0)
MCV: 85.2 fL (ref 80.0–100.0)
Platelets: 391 10*3/uL (ref 150–400)
RBC: 3.17 MIL/uL — ABNORMAL LOW (ref 4.22–5.81)
RDW: 12.9 % (ref 11.5–15.5)
WBC: 7.1 10*3/uL (ref 4.0–10.5)
nRBC: 0 % (ref 0.0–0.2)

## 2018-05-28 LAB — GLUCOSE, CAPILLARY
GLUCOSE-CAPILLARY: 163 mg/dL — AB (ref 70–99)
Glucose-Capillary: 153 mg/dL — ABNORMAL HIGH (ref 70–99)
Glucose-Capillary: 234 mg/dL — ABNORMAL HIGH (ref 70–99)
Glucose-Capillary: 276 mg/dL — ABNORMAL HIGH (ref 70–99)
Glucose-Capillary: 273 mg/dL — ABNORMAL HIGH (ref 70–99)
Glucose-Capillary: 90 mg/dL (ref 70–99)

## 2018-05-28 LAB — SEDIMENTATION RATE: Sed Rate: 40 mm/hr — ABNORMAL HIGH (ref 0–16)

## 2018-05-28 LAB — C-REACTIVE PROTEIN: CRP: <0.8 mg/dL (ref <1.0)

## 2018-05-28 MED ORDER — CHLORHEXIDINE GLUCONATE 4 % EX LIQD: 60.0000 mL | Freq: Once | CUTANEOUS | Status: DC

## 2018-05-28 MED ORDER — LIDOCAINE HCL 1 % IJ SOLN
INTRAMUSCULAR | Status: AC
  Filled 2018-05-28: qty 20

## 2018-05-28 MED ORDER — SODIUM CHLORIDE 0.45 % IV SOLN
INTRAVENOUS | Status: DC
  Administered 2018-05-28 – 2018-05-29 (×2): via INTRAVENOUS

## 2018-05-28 MED ORDER — MUPIROCIN 2 % EX OINT
1.0000 "application " | TOPICAL_OINTMENT | Freq: Two times a day (BID) | CUTANEOUS | Status: AC
  Administered 2018-05-29 – 2018-06-01 (×6): 1 via NASAL
  Filled 2018-05-28 (×2): qty 22

## 2018-05-28 MED ORDER — SODIUM CHLORIDE 0.9% FLUSH
10.0000 mL | INTRAVENOUS | Status: DC | PRN
  Administered 2018-05-29: 20 mL
  Administered 2018-06-04 – 2018-06-05 (×2): 10 mL
  Filled 2018-05-28 (×3): qty 40

## 2018-05-28 MED ORDER — LIDOCAINE HCL (PF) 1 % IJ SOLN
INTRAMUSCULAR | Status: DC | PRN
  Administered 2018-05-28: 5 mL

## 2018-05-28 MED ORDER — INSULIN DETEMIR 100 UNIT/ML ~~LOC~~ SOLN
15.0000 [IU] | Freq: Every day | SUBCUTANEOUS | Status: DC
  Administered 2018-05-29 – 2018-06-04 (×7): 15 [IU] via SUBCUTANEOUS
  Filled 2018-05-28 (×10): qty 0.15

## 2018-05-28 MED ORDER — SODIUM CHLORIDE 0.9% FLUSH
10.0000 mL | Freq: Two times a day (BID) | INTRAVENOUS | Status: DC
  Administered 2018-05-28: 20 mL
  Administered 2018-05-29 – 2018-06-05 (×9): 10 mL

## 2018-05-28 NOTE — Evaluation (Signed)
Occupational Therapy Evaluation Patient Details Name: Cesar Ali MRN: [id number] DOB: 01-24-1966 Today's Date: 05/28/2018    History of Present Illness Pt is a 52 y.o. male with CKD 3, hx of CVA, IDDM, who sustained mechanical fall on 10/14 and developed left  trimalleolar ankle fracture. He underwent ORIF on 10/22. His ankle started to have drainage noted on 11/13 Dr Dion SaucierLandau took him to the OR on 10/14 for left ankle IX D, removal of HW- all HW removed, now NWB LLE.   Clinical Impression   PTA pt reports he was mod I for ADL and mobility. Pt is currently min guard assist for transfers with RW and able to complete ADL safely and maintaining NWB status in a seated position. He will require DME for shower - Would like to practice this tub transfer in the gym with Pt prior to dc to determine true shower DME needs. Pt is able to perform figure 4 for LB ADL but requires assist when managing LB clothing and performing sit <>stand. Pt will benefit from continued OT in the acute setting and [facility]. I think it would be extremely beneficial for Cesar Ali to get home health therapy to see him navigate and function in his environment - to ensure safety and functionality.     Follow Up Recommendations  Home health OT    Equipment Recommendations  Tub/shower seat    Recommendations for Other Services       Precautions / Restrictions Precautions Precautions: Fall Restrictions Weight Bearing Restrictions: Yes LLE Weight Bearing: Non weight bearing Other Position/Activity Restrictions: VAC      Mobility Bed Mobility Overal bed mobility: Needs Assistance Bed Mobility: Sit to Supine       Sit to supine: Supervision   General bed mobility comments: Supervision for lines  Transfers Overall transfer level: Needs assistance Equipment used: Rolling walker (2 wheeled) Transfers: Sit to/from Stand Sit to Stand: Min assist;Min guard         General transfer comment: min  guard assist for balance and safety- verbal cues for WB trhoughout session    Balance Overall balance assessment: Needs assistance Sitting-balance support: Single extremity supported;No upper extremity supported;Feet supported Sitting balance-Leahy Scale: Good Sitting balance - Comments: EBO for figure 4 demonstration     Standing balance-Leahy Scale: Poor Standing balance comment: dependent on BUE support                           ADL either performed or assessed with clinical judgement   ADL Overall ADL's : Needs assistance/impaired Eating/Feeding: Independent   Grooming: Set up;Sitting;Wash/dry hands;Wash/dry face Grooming Details (indicate cue type and reason): in recliner Upper Body Bathing: Set up;Sitting   Lower Body Bathing: Set up;Sitting/lateral leans Lower Body Bathing Details (indicate cue type and reason): able to perform figure 4 Upper Body Dressing : Set up;Sitting   Lower Body Dressing: Moderate assistance;Sit to/from stand   Toilet Transfer: Min guard;Minimal assistance;Ambulation;RW Toilet Transfer Details (indicate cue type and reason): assist for balance and safety - question WB Toileting- Clothing Manipulation and Hygiene: Set up;Sitting/lateral lean       Functional mobility during ADLs: Min guard;Minimal assistance;Cueing for safety;Rolling walker       Vision Patient Visual Report: No change from baseline       Perception     Praxis      Pertinent Vitals/Pain Pain Assessment: 0-10 Pain Score: 4  Pain Location: LLE Pain Descriptors / Indicators:  Aching;Dull;Grimacing Pain Intervention(s): Limited activity within patient's tolerance;Monitored during session;Repositioned     Hand Dominance     Extremity/Trunk Assessment Upper Extremity Assessment Upper Extremity Assessment: Overall WFL for tasks assessed(previous CVA on the right)   Lower Extremity Assessment Lower Extremity Assessment: Defer to PT evaluation   Cervical /  Trunk Assessment Cervical / Trunk Assessment: Normal   Communication Communication Communication: No difficulties   Cognition Arousal/Alertness: Awake/alert Behavior During Therapy: WFL for tasks assessed/performed Overall Cognitive Status: No family/caregiver present to determine baseline cognitive functioning Area of Impairment: Safety/judgement                         Safety/Judgement: Decreased awareness of safety;Decreased awareness of deficits     General Comments: question Pt's ability to maintain WB status   General Comments  question Pt's ability to maintain WB and make good choices in home environment    Exercises     Shoulder Instructions      Home Living Family/patient expects to be discharged to:: Private residence Living Arrangements: Other relatives Available Help at Discharge: Family;Available PRN/intermittently(brother) Type of Home: House Home Access: Stairs to enter Entrance Stairs-Number of Steps: 3-5 Entrance Stairs-Rails: None Home Layout: Multi-level;Bed/bath upstairs(split level) Alternate Level Stairs-Number of Steps: 8 Alternate Level Stairs-Rails: Right Bathroom Shower/Tub: Tub/shower unit;Curtain   Bathroom Toilet: Standard     Home Equipment: Dan Humphreys - 2 wheels;Crutches          Prior Functioning/Environment Level of Independence: Independent with assistive device(s)                 OT Problem List: Decreased activity tolerance;Impaired balance (sitting and/or standing);Decreased safety awareness;Decreased knowledge of use of DME or AE;Decreased knowledge of precautions;Pain      OT Treatment/Interventions: Self-care/ADL training;DME and/or AE instruction;Therapeutic activities;Patient/family education;Balance training    OT Goals(Current goals can be found in the care plan section) Acute Rehab OT Goals Patient Stated Goal: stated he wants to be able to manage stairs better OT Goal Formulation: With patient Time For  Goal Achievement: 06/11/18 Potential to Achieve Goals: Good  OT Frequency: Min 3X/week   Barriers to D/C:            Co-evaluation              AM-PAC OT "6 Clicks" Daily Activity     Outcome Measure Help from another person eating meals?: None Help from another person taking care of personal grooming?: None(in sitting) Help from another person toileting, which includes using toliet, bedpan, or urinal?: A Little Help from another person bathing (including washing, rinsing, drying)?: A Little Help from another person to put on and taking off regular upper body clothing?: None Help from another person to put on and taking off regular lower body clothing?: A Little 6 Click Score: 21   End of Session Equipment Utilized During Treatment: Gait belt;Rolling walker Nurse Communication: Mobility status;Precautions;Weight bearing status  Activity Tolerance: Patient tolerated treatment well Patient left: in bed;with call bell/phone within reach;with bed alarm set  OT Visit Diagnosis: Unsteadiness on feet (R26.81);Other abnormalities of gait and mobility (R26.89);History of falling (Z91.81);Pain Pain - Right/Left: Left Pain - part of body: Ankle and joints of foot                Time: 1703-1720 OT Time Calculation (min): 17 min Charges:  OT General Charges $OT Visit: 1 Visit OT Evaluation $OT Eval Moderate Complexity: 1 Mod Sherryl Manges OTR/L Acute Rehabilitation Services  Pager: [phone] Office: [phone]  Evern Bio Covey Baller 05/28/2018, 5:51 PM

## 2018-05-28 NOTE — Progress Notes (Signed)
CVC site assessment: CVC placed today, large amounts of bloody drainage beneath dressing. Dressing no longer occlusive. Removed gauze reinforcement placed by floor RN. Biopatch found to be saturated with bloody drainage. Noted suture with white stabilization device at insertion site.  Dressing changed/ Gauze pressure dressing applied. Unable to determine length of exposed catheter due to bleeding. Plan to change dressing in 48 hours/ sooner if saturated.

## 2018-05-28 NOTE — Procedures (Signed)
RIJV Tunelled PICC SVC RA 20 cm EBL 0 Comp 0

## 2018-05-28 NOTE — Progress Notes (Signed)
Just arrived to care for patient notice all prior meds not given

## 2018-05-28 NOTE — Progress Notes (Signed)
OT Cancellation Note  Patient Details Name: Cesar ActonJohn D Ali MRN: [id number] DOB: 12/31/1965   Cancelled Treatment:    Reason Eval/Treat Not Completed: Patient at procedure or test/ unavailable([facility])  Evern BioLaura J Koralee Wedeking 05/28/2018, 1:40 PM   Sherryl MangesLaura Dewaine Morocho OTR/L Acute Rehabilitation Services Pager: [phone] Office: [phone]

## 2018-05-28 NOTE — Progress Notes (Signed)
    Subjective: Patient reports pain as mild.  Tolerating diet.    Objective:   VITALS:   Vitals:   05/27/18 0857 05/27/18 1307 05/27/18 1554 05/28/18 0516  BP: (!) 141/95 117/74 106/76 137/80  Pulse: 88 84 87 91  Resp: 17 16 11 17   Temp: 98 F (36.7 C) 98 F (36.7 C) 97.9 F (36.6 C) 98.4 F (36.9 C)  TempSrc: Oral Oral Oral Oral  SpO2: 100% 100% 100% 100%  Weight:      Height:       CBC Latest Ref Rng & Units 05/28/2018 05/27/2018 05/26/2018  WBC 4.0 - 10.5 K/uL 7.1 11.1(H) 12.5(H)  Hemoglobin 13.0 - 17.0 g/dL 8.3(L) 9.8(L) 10.5(L)  Hematocrit 39.0 - 52.0 % 27.0(L) 32.5(L) 34.0(L)  Platelets 150 - 400 K/uL 391 473(H) 465(H)   BMP Latest Ref Rng & Units 05/28/2018 05/26/2018 02/01/2012  Glucose 70 - 99 mg/dL 161(W243(H) - 960(A180(H)  BUN 6 - 20 mg/dL 54(U25(H) - 20  Creatinine 0.61 - 1.24 mg/dL 9.81(X1.83(H) 9.14(N1.79(H) 8.29(F1.87(H)  Sodium 135 - 145 mmol/L 135 - 141  Potassium 3.5 - 5.1 mmol/L 5.4(H) - 4.7  Chloride 98 - 111 mmol/L 100 - 106  CO2 22 - 32 mmol/L 25 - 27  Calcium 8.9 - 10.3 mg/dL 6.2(Z8.8(L) - 9.8   Intake/Output      12/15 0701 - 12/16 0700 12/16 0701 - 12/17 0700   P.O. 240    I.V. (mL/kg)     IV Piggyback     Total Intake(mL/kg) 240 (3.3)    Urine (mL/kg/hr) 1000 (0.6)    Drains 25    Blood     Total Output 1025    Net -785            Physical Exam: General: NAD.  Supine in bed on arrival.  Calm, conversant. MSK LLE: Ace wrap and posterior splint intact and in good condition.  In place and working.  Mild serosanguineous drainage on bandage and in canister. Sensation intact distally Dorsiflexion/Plantar flexion intact  Assessment / Plan: 2 Days Post-Op  S/P Procedure(s) (LRB): IRRIGATION AND DEBRIDEMENT, LEFT ANKLE (Left) HARDWARE REMOVAL, LEFT ANKLE (Left) by Dr. Dion SaucierLandau on 05/26/2018  Principal Problem:   Acute osteomyelitis of left fibula (HCC)  Doing well POD 2 AFVSN Discomfort and swelling improving   Plan for repeat I&D, possible closure and/or wound  VAC placement tomorrow by Dr. Margarita Ranaimothy Murphy.  Continue antibiotic therapies with planned PICC line placement per ID recommendations  Incentive Spirometry  Elevate  Weightbearing: NWB LLE Insicional and dressing care: Maintain splint/wound VAC VTE prophylaxis: Lovenox, SCDs, ambulation Pain control: Continue current regimen Contact information:  Margarita Ranaimothy Murphy MD, Aquilla HackerHenry Martensen PA-C  The details risks and benefits of plan were discussed with the patient.  He verbalized understanding and agrees.  Dispo: TBD.  Repeat I&D planned for tomorrow.  [facility] [place] [facility]   Anticipated LOS equal to or greater than 2 midnights due to   - Expected need for hospital services (PT, OT, Nursing) required for safe  discharge   Albina BilletHenry Calvin Martensen III, PA-C 05/28/2018, 7:24 AM

## 2018-05-28 NOTE — Progress Notes (Signed)
Physical Therapy Treatment Patient Details Name: Cesar Ali MRN: [id number] DOB: 10-Mar-1966 Today's Date: 05/28/2018    History of Present Illness Cesar Ali is a 52 y.o. male with CKD 3, hx of CVA, IDDM, who sustained mechanical fall on 10/14 and developed left  trimalleolar ankle fracture. He underwent ORIF on 10/22. His ankle started to have drainage noted on 11/13 where he was started on oral abtx and sent to wound care for evaluation and management of tissue breakdown but no report of fever or chills. The wound continued to have dehiscence with HW exposure, and purulent drainage.  He was transferred from outside ED to [facility] Dr Dion Saucier took him to the OR on 10/14 for left ankle IX D, removal of HW- including deep fibular plate and syndesmotic button fixation from both lateral and medial side- all HW removed, now NWB LLE.    PT Comments    Continuing work on functional mobility and activity tolerance;  Walked with both crutches and RW today, and Cesar Ali managed NWB MUCH better with RW; The problem is -- he tells me a RW will not fit in his house (and therefore it is highly unlikely a wheelchair will fit); He did prop his knee and stand keeping L foot and ankle NWB briefly, and would be an okay candidate for a knee walker, but, again, not sure that a knee walker will work in his home.  I know it is possible that his insurance will not cover HHPT follow up, but I strongly emphasize that a skilled PT/OT/RN presence in the home will help to problem-solve through some barriers to mobility and ADLs with NWB LLE   Follow Up Recommendations  Home health PT;Supervision/Assistance - 24 hour  Look forward to OT eval for ADLs; may be worth considering SNF stay if we can guarantee that he would receive PT and OT     Equipment Recommendations  Wheelchair (measurements PT);Wheelchair cushion (measurements PT);Other (comment)(Highly unlikely a wheelchair will fit in his home . . Reita Chard  considerign knee scooter, but will likely run into same issue))    Recommendations for Other Services OT consult     Precautions / Restrictions Precautions Precautions: Fall Restrictions LLE Weight Bearing: Non weight bearing Other Position/Activity Restrictions: VAC    Mobility  Bed Mobility                  Transfers Overall transfer level: Needs assistance Equipment used: Crutches;Rolling walker (2 wheeled) Transfers: Sit to/from Stand Sit to Stand: Min assist;Min guard         General transfer comment: Min assist to rise and steady with crutches, some difficulty managing crutches; Stood well from recliner to RW; fatigue after walking and noted decr control stand to sit  Ambulation/Gait Ambulation/Gait assistance: Min guard Gait Distance (Feet): 25 Feet(10 crutches; with RW 10+5; one seated rest break) Assistive device: Crutches;Rolling walker (2 wheeled) Gait Pattern/deviations: Step-to pattern Gait velocity: slowed   General Gait Details: Still difficulty maintaining NWB using crutches; Better with hop-step with RW LLE touched down just a few times; Fatigues relatively quickly   Social research officer, government Rankin (Stroke Patients Only)       Balance             Standing balance-Leahy Scale: Poor Standing balance comment: Needs Bil UE support in R single limb stance; Still, at one point, took a standing rest  break with L knee propped on a chair, keeping NWB L foot and ankle                            Cognition Arousal/Alertness: Awake/alert Behavior During Therapy: WFL for tasks assessed/performed Overall Cognitive Status: No family/caregiver present to determine baseline cognitive functioning Area of Impairment: Safety/judgement                         Safety/Judgement: Decreased awareness of safety;Decreased awareness of deficits     General Comments: Looks like he's putting much more  weight on his LLE than he says      Exercises      General Comments        Pertinent Vitals/Pain Pain Assessment: 0-10 Pain Score: 3  Pain Location: LLE Pain Descriptors / Indicators: Aching Pain Intervention(s): Monitored during session(elevated extremity)    Home Living                      Prior Function            PT Goals (current goals can now be found in the care plan section) Acute Rehab PT Goals Patient Stated Goal: stated he wants to be able to manage stairs better PT Goal Formulation: With patient Time For Goal Achievement: 06/10/18 Potential to Achieve Goals: Fair Progress towards PT goals: Progressing toward goals    Frequency    Min 6X/week      PT Plan Current plan remains appropriate    Co-evaluation              AM-PAC PT "6 Clicks" Mobility   Outcome Measure  Help needed turning from your back to your side while in a flat bed without using bedrails?: None Help needed moving from lying on your back to sitting on the side of a flat bed without using bedrails?: None Help needed moving to and from a bed to a chair (including a wheelchair)?: A Little Help needed standing up from a chair using your arms (e.g., wheelchair or bedside chair)?: A Little Help needed to walk in hospital room?: A Lot Help needed climbing 3-5 steps with a railing? : Total 6 Click Score: 17    End of Session Equipment Utilized During Treatment: Gait belt Activity Tolerance: Patient tolerated treatment well Patient left: in chair;with call bell/phone within reach Nurse Communication: Mobility status PT Visit Diagnosis: Unsteadiness on feet (R26.81);Other abnormalities of gait and mobility (R26.89);Difficulty in walking, not elsewhere classified (R26.2)     Time: [id number] PT Time Calculation (min) (ACUTE ONLY): 21 min  Charges:  $Gait Training: 8-22 mins                     Cesar Ali, [place]PT  Acute Rehabilitation Services Pager [phone] Office  [phone]    Cesar Ali 05/28/2018, 4:21 PM

## 2018-05-28 NOTE — Progress Notes (Signed)
   Patient Status: [facility] - In-pt  Assessment and Plan: Patient in need of venous access.   Tunneled central catheter placement ______________________________________________________________________   History of Present Illness: Cesar Ali is a 52 y.o. male   Osteomyelitis Left ankle 2 mo home IV antibiotics per MD Cr 1.83  Allergies and medications reviewed.   Review of Systems: A 12 point ROS discussed and pertinent positives are indicated in the HPI above.  All other systems are negative.  Review of Systems  Constitutional: Positive for activity change.  Psychiatric/Behavioral: Negative for behavioral problems and confusion.    Vital Signs: BP 137/80 (BP Location: Left Arm)   Pulse 91   Temp 98.4 F (36.9 C) (Oral)   Resp 17   Ht 5' 11.5" (1.816 m)   Wt 161 lb (73 kg)   SpO2 100%   BMI 22.14 kg/m    Imaging reviewed.   Labs:  COAGS: No results for input(s): INR, APTT in the last 8760 hours.  BMP: Recent Labs    05/26/18 1629 05/28/18 0319  NA  --  135  K  --  5.4*  CL  --  100  CO2  --  25  GLUCOSE  --  243*  BUN  --  25*  CALCIUM  --  8.8*  CREATININE 1.79* 1.83*  GFRNONAA 43* 42*  GFRAA 50* 48*   Left ankle osteomyelitis 2 mo home IV antibiotics Scheduled for tunneled central catheter placement in IR Pt is aware of procedure benefits and risks  Including but limited to 'infection; bleeding; vessel damage Agreeable to proceed Consent signed an din chart   Electronically Signed: Robet LeuPamela A Tyr Franca, PA-C 05/28/2018, 8:18 AM   I spent a total of 15 minutes in face to face in clinical consultation, greater than 50% of which was counseling/coordinating care for venous access.Patient ID: Cesar Ali, male   DOB: 01/06/1966, 52 y.o.   MRN: [id number]

## 2018-05-28 NOTE — Progress Notes (Signed)
Inpatient Diabetes Program Recommendations  AACE/ADA: New Consensus Statement on Inpatient Glycemic Control (2015)  Target Ranges:  Prepandial:   less than 140 mg/dL      Peak postprandial:   less than 180 mg/dL (1-2 hours)      Critically ill patients:  140 - 180 mg/dL   Lab Results  Component Value Date   GLUCAP 234 (H) 05/28/2018   HGBA1C 13.3 07/14/2010    Review of Glycemic Control Results for Cesar Ali, Cesar Ali (MRN [id number]) as of 05/28/2018 10:24  Ref. Range 05/27/2018 11:54 05/27/2018 16:53 05/27/2018 21:03 05/28/2018 06:10  Glucose-Capillary Latest Ref Range: 70 - 99 mg/dL 841308 (H) 324153 (H) 401163 (H) 234 (H)   Diabetes history: Type 1 DM per H&P Outpatient Diabetes medications: Novolin R 2-8 units BID (with meals), Levemir 25 units BID Current orders for Inpatient glycemic control: Novolog 0-15 units TID Decadron 5 mg on 12/14  Inpatient Diabetes Program Recommendations:    Noted in H&P, patient is type 1 DM? Thus, requiring basal and bolus insulin.   Given current trends, would recommend restarting a portion of patient's home insulin dose. Consider Levemir 15 units QHS.   Last A1C 13.3, however this result was from 2012. Would recommend close follow up with PCP following DC for DM management.   Thanks, Lujean RaveLauren Nazareth Kirk, MSN, RNC-OB Diabetes Coordinator [phone] (8a-5p)

## 2018-05-28 NOTE — Progress Notes (Signed)
[facility]  Date of Admission:  05/26/2018     Total days of antibiotics 3         ASSESSMENT/PLAN  Cesar Ali is on Day 3 of antimicrobial therapy for osteomyelitis of the left fibula and s/p irrigation and debridement and hardware removal. He has remained afebrile with now normal WBC count. He does not have any cultures pending and is currently being treated with broad spectrum vancomycin and cefepime. Orthopedics plans for repeat incision and drainage tomorrow. He is tolerating his antibiotics with no adverse side effects. Will need to continue watching creatinine and kidney function as he will require prolonged therapy of at least 6 weeks and likely with broad spectrum antibiotics including vancomycin.  1. Continue current dose of vancomycin and cefepime for broad spectrum coverage. 2. Monitor renal function with low threshold to change to Daptomycin for any worsening. 3. Surgical intervention planned for tomorrow.   Principal Problem:   Acute osteomyelitis of left fibula (HCC)   . chlorhexidine  60 mL Topical Once  . docusate sodium  100 mg Oral BID  . enoxaparin (LOVENOX) injection  30 mg Subcutaneous Q24H  . gabapentin  300 mg Oral TID  . insulin aspart  0-15 Units Subcutaneous TID WC  . insulin detemir  15 Units Subcutaneous QHS  . lidocaine      . traMADol  50 mg Oral Q6H    SUBJECTIVE:  Afebrile overnight and doing okay. Brother is visiting.  No Known Allergies   Review of Systems: Review of Systems  Constitutional: Negative for chills, fever and malaise/fatigue.  Respiratory: Negative for cough, shortness of breath and wheezing.   Cardiovascular: Negative for chest pain.  Gastrointestinal: Negative for abdominal pain, constipation, diarrhea, nausea and vomiting.  Musculoskeletal:       Positive for left leg pain.   Skin: Negative for rash.      OBJECTIVE: Vitals:   05/27/18 0857 05/27/18 1307 05/27/18 1554 05/28/18  0516  BP: (!) 141/95 117/74 106/76 137/80  Pulse: 88 84 87 91  Resp: 17 16 11 17   Temp: 98 F (36.7 C) 98 F (36.7 C) 97.9 F (36.6 C) 98.4 F (36.9 C)  TempSrc: Oral Oral Oral Oral  SpO2: 100% 100% 100% 100%  Weight:      Height:       Body mass index is 22.14 kg/m.  Physical Exam Constitutional:      General: He is not in acute distress.    Appearance: He is well-developed.     Comments: Lying in bed with head of bed elevated; pleasant.   Cardiovascular:     Rate and Rhythm: Normal rate and regular rhythm.     Heart sounds: Normal heart sounds. No murmur.  Pulmonary:     Effort: Pulmonary effort is normal. No respiratory distress.     Breath sounds: Normal breath sounds. No wheezing.  Abdominal:     General: Abdomen is flat. Bowel sounds are normal. There is no distension.     Palpations: Abdomen is soft. There is no mass.  Musculoskeletal:     Comments: Surgical dressing in place with wound vac functioning.  Skin:    General: Skin is warm and dry.  Neurological:     Mental Status: He is alert.  Psychiatric:        Mood and Affect: Mood normal.     Lab Results Lab Results  Component Value Date   WBC 7.1 05/28/2018   HGB 8.3 (  L) 05/28/2018   HCT 27.0 (L) 05/28/2018   MCV 85.2 05/28/2018   PLT 391 05/28/2018    Lab Results  Component Value Date   CREATININE 1.83 (H) 05/28/2018   BUN 25 (H) 05/28/2018   NA 135 05/28/2018   K 5.4 (H) 05/28/2018   CL 100 05/28/2018   CO2 25 05/28/2018    Lab Results  Component Value Date   ALT 39 07/20/2010   AST 24 07/20/2010   ALKPHOS 101 07/20/2010   BILITOT 0.3 07/20/2010     Microbiology: No results found for this or any previous visit (from the past 240 hour(s)).   Cesar Ali , NP [facility] [phone] Pager  05/28/2018  12:33 PM

## 2018-05-28 NOTE — Progress Notes (Signed)
[facility]  [facility] will follow pt with ID team to support Home Infusion Pharmacy services at DC if ordered.  If patient discharges after hours, please call [phone].   Sedalia Mutaamela S Chandler 05/28/2018, 11:06 AM

## 2018-05-29 ENCOUNTER — Encounter (HOSPITAL_COMMUNITY): Payer: Self-pay | Admitting: General Practice

## 2018-05-29 ENCOUNTER — Encounter (HOSPITAL_COMMUNITY)
Admission: RE | Disposition: A | Discharge status: Home / Self Care | Payer: Self-pay | Source: Home / Self Care | Attending: Orthopedic Surgery

## 2018-05-29 ENCOUNTER — Inpatient Hospital Stay (HOSPITAL_COMMUNITY): Payer: Medicaid Other | Admitting: Certified Registered Nurse Anesthetist

## 2018-05-29 DIAGNOSIS — Z95828 Presence of other vascular implants and grafts: Secondary | ICD-10-CM

## 2018-05-29 LAB — BASIC METABOLIC PANEL
Anion gap: 8 (ref 5–15)
BUN: 20 mg/dL (ref 6–20)
CHLORIDE: 101 mmol/L (ref 98–111)
CO2: 24 mmol/L (ref 22–32)
Calcium: 8.6 mg/dL — ABNORMAL LOW (ref 8.9–10.3)
Creatinine, Ser: 1.34 mg/dL — ABNORMAL HIGH (ref 0.61–1.24)
GFR calc Af Amer: >60 mL/min (ref >60)
GFR calc non Af Amer: >60 mL/min (ref >60)
Glucose, Bld: 217 mg/dL — ABNORMAL HIGH (ref 70–99)
Potassium: 4.9 mmol/L (ref 3.5–5.1)
Sodium: 133 mmol/L — ABNORMAL LOW (ref 135–145)

## 2018-05-29 LAB — GLUCOSE, CAPILLARY
GLUCOSE-CAPILLARY: 148 mg/dL — AB (ref 70–99)
Glucose-Capillary: 156 mg/dL — ABNORMAL HIGH (ref 70–99)
Glucose-Capillary: 218 mg/dL — ABNORMAL HIGH (ref 70–99)
Glucose-Capillary: 175 mg/dL — ABNORMAL HIGH (ref 70–99)
Glucose-Capillary: 336 mg/dL — ABNORMAL HIGH (ref 70–99)

## 2018-05-29 LAB — SURGICAL PCR SCREEN
MRSA, PCR: NEGATIVE
Staphylococcus aureus: NEGATIVE

## 2018-05-29 LAB — CK: Total CK: 57 U/L (ref 49–397)

## 2018-05-29 SURGERY — IRRIGATION AND DEBRIDEMENT EXTREMITY
Anesthesia: General | Laterality: Left

## 2018-05-29 MED ORDER — FENTANYL CITRATE (PF) 250 MCG/5ML IJ SOLN
INTRAMUSCULAR | Status: AC
  Filled 2018-05-29: qty 5

## 2018-05-29 MED ORDER — MIDAZOLAM HCL 2 MG/2ML IJ SOLN
INTRAMUSCULAR | Status: AC
  Filled 2018-05-29: qty 2

## 2018-05-29 MED ORDER — SODIUM CHLORIDE 0.9 % IV SOLN
INTRAVENOUS | Status: DC
  Administered 2018-05-29 – 2018-05-31 (×4): via INTRAVENOUS

## 2018-05-29 MED ORDER — LACTATED RINGERS IV SOLN
INTRAVENOUS | Status: DC
  Administered 2018-05-29: 14:00:00 via INTRAVENOUS

## 2018-05-29 MED ORDER — ACETAMINOPHEN 500 MG PO TABS: 1000.0000 mg | ORAL_TABLET | Freq: Once | ORAL | Status: DC

## 2018-05-29 SURGICAL SUPPLY — 50 items
BANDAGE ACE 4X5 VEL STRL LF (GAUZE/BANDAGES/DRESSINGS) ×3 IMPLANT
BANDAGE ACE 6X5 VEL STRL LF (GAUZE/BANDAGES/DRESSINGS) ×3 IMPLANT
BANDAGE ESMARK 6X9 LF (GAUZE/BANDAGES/DRESSINGS) IMPLANT
BLADE SURG 10 STRL SS (BLADE) ×3 IMPLANT
BNDG COHESIVE 4X5 TAN STRL (GAUZE/BANDAGES/DRESSINGS) ×3 IMPLANT
BNDG ESMARK 4X9 LF (GAUZE/BANDAGES/DRESSINGS) IMPLANT
BNDG ESMARK 6X9 LF (GAUZE/BANDAGES/DRESSINGS)
BNDG GAUZE ELAST 4 BULKY (GAUZE/BANDAGES/DRESSINGS) ×3 IMPLANT
CONT SPEC 4OZ CLIKSEAL STRL BL (MISCELLANEOUS) IMPLANT
COVER SURGICAL LIGHT HANDLE (MISCELLANEOUS) ×3 IMPLANT
COVER WAND RF STERILE (DRAPES) ×3 IMPLANT
CUFF TOURN SGL LL 12 NO SLV (MISCELLANEOUS) IMPLANT
CUFF TOURNIQUET SINGLE 34IN LL (TOURNIQUET CUFF) IMPLANT
DRAPE SURG 17X23 STRL (DRAPES) IMPLANT
DRAPE U-SHAPE 47X51 STRL (DRAPES) IMPLANT
DRSG PAD ABDOMINAL 8X10 ST (GAUZE/BANDAGES/DRESSINGS) ×3 IMPLANT
DURAPREP 26ML APPLICATOR (WOUND CARE) ×3 IMPLANT
ELECT REM PT RETURN 9FT ADLT (ELECTROSURGICAL)
ELECTRODE REM PT RTRN 9FT ADLT (ELECTROSURGICAL) IMPLANT
EVACUATOR 1/8 PVC DRAIN (DRAIN) IMPLANT
FACESHIELD WRAPAROUND (MASK) ×3 IMPLANT
GAUZE SPONGE 4X4 12PLY STRL (GAUZE/BANDAGES/DRESSINGS) ×3 IMPLANT
GAUZE XEROFORM 1X8 LF (GAUZE/BANDAGES/DRESSINGS) ×3 IMPLANT
GLOVE BIO SURGEON STRL SZ7.5 (GLOVE) ×6 IMPLANT
GLOVE BIOGEL PI IND STRL 8 (GLOVE) ×2 IMPLANT
GLOVE BIOGEL PI INDICATOR 8 (GLOVE) ×4
GOWN STRL REUS W/ TWL LRG LVL3 (GOWN DISPOSABLE) ×3 IMPLANT
GOWN STRL REUS W/TWL LRG LVL3 (GOWN DISPOSABLE) ×6
HANDPIECE INTERPULSE COAX TIP (DISPOSABLE)
KIT BASIN OR (CUSTOM PROCEDURE TRAY) ×3 IMPLANT
KIT TURNOVER KIT B (KITS) ×3 IMPLANT
MANIFOLD NEPTUNE II (INSTRUMENTS) ×3 IMPLANT
NEEDLE 25GAX1.5 (MISCELLANEOUS) IMPLANT
NS IRRIG 1000ML POUR BTL (IV SOLUTION) ×3 IMPLANT
PACK ORTHO EXTREMITY (CUSTOM PROCEDURE TRAY) ×3 IMPLANT
PAD ARMBOARD 7.5X6 YLW CONV (MISCELLANEOUS) ×6 IMPLANT
SET HNDPC FAN SPRY TIP SCT (DISPOSABLE) IMPLANT
SPONGE LAP 18X18 X RAY DECT (DISPOSABLE) IMPLANT
STOCKINETTE IMPERVIOUS 9X36 MD (GAUZE/BANDAGES/DRESSINGS) ×3 IMPLANT
SUT ETHILON 3 0 PS 1 (SUTURE) IMPLANT
SUT PDS AB 2-0 CT1 27 (SUTURE) IMPLANT
SWAB CULTURE ESWAB REG 1ML (MISCELLANEOUS) IMPLANT
SYR CONTROL 10ML LL (SYRINGE) IMPLANT
TOWEL OR 17X24 6PK STRL BLUE (TOWEL DISPOSABLE) ×3 IMPLANT
TOWEL OR 17X26 10 PK STRL BLUE (TOWEL DISPOSABLE) ×3 IMPLANT
TUBE CONNECTING 12'X1/4 (SUCTIONS) ×1
TUBE CONNECTING 12X1/4 (SUCTIONS) ×2 IMPLANT
TUBING CYSTO DISP (UROLOGICAL SUPPLIES) IMPLANT
UNDERPAD 30X30 (UNDERPADS AND DIAPERS) ×3 IMPLANT
YANKAUER SUCT BULB TIP NO VENT (SUCTIONS) ×3 IMPLANT

## 2018-05-29 NOTE — Progress Notes (Signed)
Pharmacy Antibiotic Note  Cesar Ali is a 52 y.o. male admitted on 05/26/2018 with osteomyelitis. He sustained a mechanical fall on 03/26/18 and developed left ankle fracture. He underwent ORIF on 04/03/18 and started to note drainage on 04/25/18. He was started on PO antibiotics and sent to wound care for management. The wound continued to have dehiscence, purulent drainage, and mild erythema around the wound. He is now s/p left ankle I&D and removal of hardware on 05/26/18.   Pharmacy has been consulted for vancomycin dosing.  He is also on ceftriaxone per MD.  Renal function is relatively stable, afebrile, WBC normalized.  Plan for repeat I&D today and possibly intra-op cultures.  Plan: Continue vanc 1250mg  IV Q24H for AUC 514 using SCr 1.79 CTX 2gm IV Q24H per MD Monitor renal fxn, clinical progress, vanc levels soon   Height: 5' 11.5" (181.6 cm) Weight: 161 lb (73 kg) IBW/kg (Calculated) : 76.45  Temp (24hrs), Avg:98.2 F (36.8 C), Min:97.8 F (36.6 C), Max:98.6 F (37 C)  Recent Labs  Lab 05/26/18 1629 05/27/18 0122 05/28/18 0319  WBC 12.5* 11.1* 7.1  CREATININE 1.79*  --  1.83*    Estimated Creatinine Clearance: 49.3 mL/min (A) (by C-G formula based on SCr of 1.83 mg/dL (H)).    No Known Allergies   Vanc 12/14>> CTX 12/14>> Keflex 12/6 PTA >> 12/14 Bactrim 12/6 PTA >> 12/14   Cesar Ali D. Laney Potashang, PharmD, BCPS, BCCCP 05/29/2018, 9:49 AM

## 2018-05-29 NOTE — Progress Notes (Signed)
[facility]  Date of Admission:  05/26/2018     Total days of antibiotics 4         ASSESSMENT/PLAN  Cesar Ali continues to receive broad spectrum treatment for acute osteomyelitis of the left fibula with no available cultures for reference. Scheduled to go back for repeat incision and drainage today. Remains stable with vancomycin and ceftriaxone. Renal function remains questionable and awaiting repeat BMET. If kidney function has worsened will consider changing to therapy from vancomycin to daptomycin. He will require prolonged therapy with treatment duration pending surgery.  1. Continue current dose of vancomycin and ceftriaxone. 2. Continue to monitor fever curve and renal function.   Principal Problem:   Acute osteomyelitis of left fibula (HCC)   . chlorhexidine  60 mL Topical Once  . docusate sodium  100 mg Oral BID  . enoxaparin (LOVENOX) injection  30 mg Subcutaneous Q24H  . gabapentin  300 mg Oral TID  . insulin aspart  0-15 Units Subcutaneous TID WC  . insulin detemir  15 Units Subcutaneous QHS  . mupirocin ointment  1 application Nasal BID  . sodium chloride flush  10-40 mL Intracatheter Q12H  . traMADol  50 mg Oral Q6H    SUBJECTIVE:  Afebrile overnight with no acute events or concerns. Continues to have some mild pain in his ankle for which he is receiving pain medication for. Central line placed yesterday.   No Known Allergies   Review of Systems: Review of Systems  Constitutional: Negative for fever.  Respiratory: Negative for cough, sputum production, shortness of breath and wheezing.   Cardiovascular: Negative for chest pain and leg swelling.  Gastrointestinal: Negative for abdominal pain, constipation, diarrhea, nausea and vomiting.  Musculoskeletal: Positive for joint pain (left shin/ankle).  Skin: Negative for rash.      OBJECTIVE: Vitals:   05/28/18 0516 05/28/18 1409 05/28/18 1957 05/29/18 0509  BP: 137/80  140/87 125/70 (!) 147/92  Pulse: 91 86 91 92  Resp: 17 16 18    Temp: 98.4 F (36.9 C) 97.8 F (36.6 C) 98.6 F (37 C)   TempSrc: Oral Oral Oral   SpO2: 100% 100% 100% 99%  Weight:      Height:       Body mass index is 22.14 kg/m.  Physical Exam Constitutional:      General: He is not in acute distress.    Appearance: He is well-developed.  Cardiovascular:     Rate and Rhythm: Normal rate and regular rhythm.     Heart sounds: Normal heart sounds.  Pulmonary:     Effort: Pulmonary effort is normal.     Breath sounds: Normal breath sounds. No wheezing, rhonchi or rales.  Chest:     Chest wall: No tenderness.  Skin:    General: Skin is warm and dry.  Neurological:     Mental Status: He is alert and oriented to person, place, and time.     Lab Results Lab Results  Component Value Date   WBC 7.1 05/28/2018   HGB 8.3 (L) 05/28/2018   HCT 27.0 (L) 05/28/2018   MCV 85.2 05/28/2018   PLT 391 05/28/2018    Lab Results  Component Value Date   CREATININE 1.83 (H) 05/28/2018   BUN 25 (H) 05/28/2018   NA 135 05/28/2018   K 5.4 (H) 05/28/2018   CL 100 05/28/2018   CO2 25 05/28/2018    Lab Results  Component Value Date   ALT 39 07/20/2010  AST 24 07/20/2010   ALKPHOS 101 07/20/2010   BILITOT 0.3 07/20/2010     Microbiology: Recent Results (from the past 240 hour(s))  Surgical PCR screen     Status: None   Collection Time: 05/28/18 10:51 PM  Result Value Ref Range Status   MRSA, PCR NEGATIVE NEGATIVE Final   Staphylococcus aureus NEGATIVE NEGATIVE Final    Comment: (NOTE) The Xpert SA Assay (FDA approved for NASAL specimens in patients 51 years of age and older), is one component of a comprehensive surveillance program. It is not intended to diagnose infection nor to guide or monitor treatment. Performed at [facility], [address] [place], [name] [address]      Cesar EkeGreg Anuel Sitter, NP [facility] [phone] Pager  05/29/2018  10:37 AM

## 2018-05-29 NOTE — Anesthesia Preprocedure Evaluation (Addendum)
Anesthesia Evaluation  Patient identified by MRN, date of birth, ID band Patient awake    Reviewed: Allergy & Precautions, H&P , NPO status , Patient's Chart, lab work & pertinent test results  Airway Mallampati: II  TM Distance: >3 FB Neck ROM: Full    Dental no notable dental hx. (+) Edentulous Upper, Edentulous Lower, Dental Advisory Given   Pulmonary neg pulmonary ROS, former smoker,    Pulmonary exam normal breath sounds clear to auscultation       Cardiovascular hypertension, Pt. on medications  Rhythm:Regular Rate:Normal     Neuro/Psych CVA, Residual Symptoms negative psych ROS   GI/Hepatic negative GI ROS, Neg liver ROS,   Endo/Other  diabetes, Insulin Dependent  Renal/GU Renal InsufficiencyRenal disease  negative genitourinary   Musculoskeletal   Abdominal   Peds  Hematology  (+) Blood dyscrasia, anemia ,   Anesthesia Other Findings   Reproductive/Obstetrics negative OB ROS                           Anesthesia Physical Anesthesia Plan  ASA: III  Anesthesia Plan: General   Post-op Pain Management:    Induction: Intravenous  PONV Risk Score and Plan: 3 and Ondansetron and Midazolam  Airway Management Planned: LMA  Additional Equipment:   Intra-op Plan:   Post-operative Plan: Extubation in OR  Informed Consent: I have reviewed the patients History and Physical, chart, labs and discussed the procedure including the risks, benefits and alternatives for the proposed anesthesia with the patient or authorized representative who has indicated his/her understanding and acceptance.   Dental advisory given  Plan Discussed with: CRNA  Anesthesia Plan Comments:         Anesthesia Quick Evaluation

## 2018-05-29 NOTE — Progress Notes (Signed)
Patient has been moved until tomorrow per Dr. Eulah PontMurphy. Dr. Eulah PontMurphy was going to come speak to patient but patient refused to stay in Short Stay to wait to speak to Dr. Eulah PontMurphy and requested to be transported back to 5N.

## 2018-05-29 NOTE — Progress Notes (Signed)
Inpatient Diabetes Program Recommendations  AACE/ADA: New Consensus Statement on Inpatient Glycemic Control (2015)  Target Ranges:  Prepandial:   less than 140 mg/dL      Peak postprandial:   less than 180 mg/dL (1-2 hours)      Critically ill patients:  140 - 180 mg/dL   Lab Results  Component Value Date   GLUCAP 156 (H) 05/29/2018   HGBA1C 13.3 07/14/2010      Results for Cesar Ali, [name] (MRN [id number]) as of 05/29/2018 10:53  Ref. Range 05/27/2018 21:03 05/28/2018 06:10 05/28/2018 11:49 05/28/2018 16:17 05/28/2018 21:16 05/29/2018 06:28  Glucose-Capillary Latest Ref Range: 70 - 99 mg/dL 409163 (H) 811234 (H) 914276 (H) 273 (H) 90 156 (H)    Home DM meds: Novolin R 2-8 units BID (with meals), Levemir 25 units BID  Current inpatient DM meds: Levemir 15 units q hs (has NOT had a dose yet - see below)                                              Novolog moderate correction scale (0-15 units) tid                                                 Per chart (and bedside RN verified with patient this am) he is Type 1 diabetic. Therefore, yesterday diabetes coordinator requested adding basal insulin to current bolus orders as both are needed if type 1.   Order given by MD yesterday to start Levemir 15 units q hs. [facility]. Also refused Novolog correction this am. Plan for surgery today. Spoke to bedside RN just now who asked patient about last night and he stated he didn't want to take insulin before his surgery. She explained to him that the long acting basal insulin should not drop his blood sugar and going into surgery with a high CBG is NOT recommended. He stated he will take it tonight after his surgery.   The last Hgb A1c found in Epic is from 2012 and it was 13.3% which is an average blood glucose of 335 over a 2-3 month period.   MD - Please consider ordering Hgb A1c to determine what patient's CBG averaging at home.   Thank you.  -- Will follow during  hospitalization.--  Jamelle RushingSusan Rochell Puett RN, MSN Diabetes Coordinator Inpatient Glycemic Control Team Team Pager: [phone] (8am-5pm)

## 2018-05-30 ENCOUNTER — Inpatient Hospital Stay (HOSPITAL_COMMUNITY): Payer: Medicaid Other | Admitting: Certified Registered"

## 2018-05-30 ENCOUNTER — Encounter (HOSPITAL_COMMUNITY): Payer: Self-pay | Admitting: Certified Registered"

## 2018-05-30 ENCOUNTER — Encounter (HOSPITAL_COMMUNITY)
Admission: RE | Disposition: A | Discharge status: Home / Self Care | Payer: Self-pay | Source: Home / Self Care | Attending: Orthopedic Surgery

## 2018-05-30 HISTORY — PX: I & D EXTREMITY: SHX5045

## 2018-05-30 LAB — GLUCOSE, CAPILLARY
Glucose-Capillary: 258 mg/dL — ABNORMAL HIGH (ref 70–99)
Glucose-Capillary: 267 mg/dL — ABNORMAL HIGH (ref 70–99)
Glucose-Capillary: 245 mg/dL — ABNORMAL HIGH (ref 70–99)
Glucose-Capillary: 146 mg/dL — ABNORMAL HIGH (ref 70–99)
Glucose-Capillary: 118 mg/dL — ABNORMAL HIGH (ref 70–99)
Glucose-Capillary: 110 mg/dL — ABNORMAL HIGH (ref 70–99)

## 2018-05-30 LAB — BASIC METABOLIC PANEL
Anion gap: 8 (ref 5–15)
BUN: 17 mg/dL (ref 6–20)
CALCIUM: 9.2 mg/dL (ref 8.9–10.3)
CO2: 26 mmol/L (ref 22–32)
Chloride: 103 mmol/L (ref 98–111)
Creatinine, Ser: 1.27 mg/dL — ABNORMAL HIGH (ref 0.61–1.24)
GFR calc non Af Amer: >60 mL/min (ref >60)
Glucose, Bld: 255 mg/dL — ABNORMAL HIGH (ref 70–99)
Potassium: 4.4 mmol/L (ref 3.5–5.1)
Sodium: 137 mmol/L (ref 135–145)

## 2018-05-30 LAB — HEMOGLOBIN A1C
Hgb A1c MFr Bld: 11.7 % — ABNORMAL HIGH (ref 4.8–5.6)
MEAN PLASMA GLUCOSE: 289.09 mg/dL

## 2018-05-30 SURGERY — IRRIGATION AND DEBRIDEMENT EXTREMITY
Anesthesia: General | Site: Ankle | Laterality: Left

## 2018-05-30 MED ORDER — MIDAZOLAM HCL 5 MG/5ML IJ SOLN
INTRAMUSCULAR | Status: DC | PRN
  Administered 2018-05-30: 2 mg via INTRAVENOUS

## 2018-05-30 MED ORDER — HYDROMORPHONE HCL 1 MG/ML IJ SOLN
INTRAMUSCULAR | Status: AC
  Filled 2018-05-30: qty 1

## 2018-05-30 MED ORDER — ONDANSETRON HCL 4 MG/2ML IJ SOLN: 4.0000 mg | Freq: Once | INTRAMUSCULAR | Status: DC | PRN

## 2018-05-30 MED ORDER — FENTANYL CITRATE (PF) 100 MCG/2ML IJ SOLN
INTRAMUSCULAR | Status: AC
  Filled 2018-05-30: qty 2

## 2018-05-30 MED ORDER — OXYCODONE HCL 5 MG/5ML PO SOLN: 5.0000 mg | Freq: Once | ORAL | Status: DC | PRN

## 2018-05-30 MED ORDER — OXYCODONE HCL 5 MG PO TABS: 5.0000 mg | ORAL_TABLET | Freq: Once | ORAL | Status: DC | PRN

## 2018-05-30 MED ORDER — LABETALOL HCL 5 MG/ML IV SOLN
5.0000 mg | Freq: Once | INTRAVENOUS | Status: AC
  Administered 2018-05-30: 5 mg via INTRAVENOUS

## 2018-05-30 MED ORDER — LABETALOL HCL 5 MG/ML IV SOLN
INTRAVENOUS | Status: AC
  Filled 2018-05-30: qty 4

## 2018-05-30 MED ORDER — ONDANSETRON HCL 4 MG/2ML IJ SOLN
INTRAMUSCULAR | Status: DC | PRN
  Administered 2018-05-30: 4 mg via INTRAVENOUS

## 2018-05-30 MED ORDER — PROPOFOL 10 MG/ML IV BOLUS
INTRAVENOUS | Status: DC | PRN
  Administered 2018-05-30: 50 mg via INTRAVENOUS

## 2018-05-30 MED ORDER — LIDOCAINE HCL (CARDIAC) PF 100 MG/5ML IV SOSY
PREFILLED_SYRINGE | INTRAVENOUS | Status: DC | PRN
  Administered 2018-05-30: 60 mg via INTRAVENOUS

## 2018-05-30 MED ORDER — LACTATED RINGERS IV SOLN: INTRAVENOUS | Status: DC

## 2018-05-30 MED ORDER — LIDOCAINE 2% (20 MG/ML) 5 ML SYRINGE
INTRAMUSCULAR | Status: AC
  Filled 2018-05-30: qty 5

## 2018-05-30 MED ORDER — CALCIUM CHLORIDE 10 % IV SOLN
INTRAVENOUS | Status: AC
  Filled 2018-05-30: qty 10

## 2018-05-30 MED ORDER — 0.9 % SODIUM CHLORIDE (POUR BTL) OPTIME
TOPICAL | Status: DC | PRN
  Administered 2018-05-30: 1000 mL

## 2018-05-30 MED ORDER — MIDAZOLAM HCL 2 MG/2ML IJ SOLN
INTRAMUSCULAR | Status: AC
  Filled 2018-05-30: qty 2

## 2018-05-30 MED ORDER — BUPIVACAINE HCL (PF) 0.25 % IJ SOLN
INTRAMUSCULAR | Status: AC
  Filled 2018-05-30: qty 30

## 2018-05-30 MED ORDER — FENTANYL CITRATE (PF) 100 MCG/2ML IJ SOLN
25.0000 ug | INTRAMUSCULAR | Status: DC | PRN
  Administered 2018-05-30: 50 ug via INTRAVENOUS

## 2018-05-30 MED ORDER — PROPOFOL 10 MG/ML IV BOLUS
INTRAVENOUS | Status: AC
  Filled 2018-05-30: qty 20

## 2018-05-30 MED ORDER — LACTATED RINGERS IV SOLN
INTRAVENOUS | Status: DC | PRN
  Administered 2018-05-30: 18:00:00 via INTRAVENOUS

## 2018-05-30 MED ORDER — CEFAZOLIN SODIUM-DEXTROSE 2-4 GM/100ML-% IV SOLN
INTRAVENOUS | Status: AC
  Filled 2018-05-30: qty 100

## 2018-05-30 MED ORDER — CEFAZOLIN SODIUM-DEXTROSE 2-4 GM/100ML-% IV SOLN
2.0000 g | INTRAVENOUS | Status: AC
  Administered 2018-05-30: 2 g via INTRAVENOUS

## 2018-05-30 MED ORDER — HYDROMORPHONE HCL 1 MG/ML IJ SOLN
0.2500 mg | INTRAMUSCULAR | Status: DC | PRN
  Administered 2018-05-30 (×2): 0.5 mg via INTRAVENOUS

## 2018-05-30 MED ORDER — FENTANYL CITRATE (PF) 100 MCG/2ML IJ SOLN
INTRAMUSCULAR | Status: DC | PRN
  Administered 2018-05-30: 75 ug via INTRAVENOUS

## 2018-05-30 MED ORDER — FENTANYL CITRATE (PF) 250 MCG/5ML IJ SOLN
INTRAMUSCULAR | Status: AC
  Filled 2018-05-30: qty 5

## 2018-05-30 MED ORDER — LIVING WELL WITH DIABETES BOOK
Freq: Once | Status: DC
  Filled 2018-05-30: qty 1

## 2018-05-30 SURGICAL SUPPLY — 56 items
BANDAGE ACE 4X5 VEL STRL LF (GAUZE/BANDAGES/DRESSINGS) IMPLANT
BANDAGE ACE 6X5 VEL STRL LF (GAUZE/BANDAGES/DRESSINGS) IMPLANT
BANDAGE ELASTIC 4 VELCRO ST LF (GAUZE/BANDAGES/DRESSINGS) ×2 IMPLANT
BANDAGE ESMARK 6X9 LF (GAUZE/BANDAGES/DRESSINGS) IMPLANT
BLADE SURG 10 STRL SS (BLADE) ×2 IMPLANT
BNDG COHESIVE 4X5 TAN STRL (GAUZE/BANDAGES/DRESSINGS) ×2 IMPLANT
BNDG ESMARK 4X9 LF (GAUZE/BANDAGES/DRESSINGS) IMPLANT
BNDG ESMARK 6X9 LF (GAUZE/BANDAGES/DRESSINGS)
BNDG GAUZE ELAST 4 BULKY (GAUZE/BANDAGES/DRESSINGS) IMPLANT
CANISTER WOUNDNEG PRESSURE 500 (CANNISTER) ×2 IMPLANT
CONT SPEC 4OZ CLIKSEAL STRL BL (MISCELLANEOUS) IMPLANT
COVER SURGICAL LIGHT HANDLE (MISCELLANEOUS) ×2 IMPLANT
COVER WAND RF STERILE (DRAPES) ×2 IMPLANT
CUFF TOURN SGL LL 12 NO SLV (MISCELLANEOUS) IMPLANT
CUFF TOURNIQUET SINGLE 34IN LL (TOURNIQUET CUFF) IMPLANT
DRAPE SURG 17X23 STRL (DRAPES) IMPLANT
DRAPE U-SHAPE 47X51 STRL (DRAPES) IMPLANT
DRESSING VERAFLO CLEANSE CC (GAUZE/BANDAGES/DRESSINGS) ×1 IMPLANT
DRSG EMULSION OIL 3X3 NADH (GAUZE/BANDAGES/DRESSINGS) ×4 IMPLANT
DRSG PAD ABDOMINAL 8X10 ST (GAUZE/BANDAGES/DRESSINGS) ×2 IMPLANT
DRSG VERAFLO CLEANSE CC (GAUZE/BANDAGES/DRESSINGS) ×2
DURAPREP 26ML APPLICATOR (WOUND CARE) IMPLANT
ELECT REM PT RETURN 9FT ADLT (ELECTROSURGICAL) ×2
ELECTRODE REM PT RTRN 9FT ADLT (ELECTROSURGICAL) ×1 IMPLANT
EVACUATOR 1/8 PVC DRAIN (DRAIN) IMPLANT
FACESHIELD WRAPAROUND (MASK) IMPLANT
GAUZE SPONGE 4X4 12PLY STRL (GAUZE/BANDAGES/DRESSINGS) ×2 IMPLANT
GAUZE XEROFORM 1X8 LF (GAUZE/BANDAGES/DRESSINGS) IMPLANT
GLOVE BIO SURGEON STRL SZ7.5 (GLOVE) ×4 IMPLANT
GLOVE BIOGEL PI IND STRL 8 (GLOVE) ×2 IMPLANT
GLOVE BIOGEL PI INDICATOR 8 (GLOVE) ×2
GOWN STRL REUS W/ TWL LRG LVL3 (GOWN DISPOSABLE) ×3 IMPLANT
GOWN STRL REUS W/TWL LRG LVL3 (GOWN DISPOSABLE) ×3
HANDPIECE INTERPULSE COAX TIP (DISPOSABLE)
KIT BASIN OR (CUSTOM PROCEDURE TRAY) ×2 IMPLANT
KIT TURNOVER KIT B (KITS) ×2 IMPLANT
MANIFOLD NEPTUNE II (INSTRUMENTS) ×2 IMPLANT
NEEDLE 25GAX1.5 (MISCELLANEOUS) IMPLANT
NS IRRIG 1000ML POUR BTL (IV SOLUTION) ×2 IMPLANT
PACK ORTHO EXTREMITY (CUSTOM PROCEDURE TRAY) ×2 IMPLANT
PAD ARMBOARD 7.5X6 YLW CONV (MISCELLANEOUS) ×4 IMPLANT
PAD CAST 4YDX4 CTTN HI CHSV (CAST SUPPLIES) ×2 IMPLANT
PAD NEG PRESSURE SENSATRAC (MISCELLANEOUS) ×2 IMPLANT
PADDING CAST COTTON 4X4 STRL (CAST SUPPLIES) ×2
SET HNDPC FAN SPRY TIP SCT (DISPOSABLE) IMPLANT
SPONGE LAP 18X18 X RAY DECT (DISPOSABLE) IMPLANT
STOCKINETTE IMPERVIOUS 9X36 MD (GAUZE/BANDAGES/DRESSINGS) ×2 IMPLANT
SUT ETHILON 3 0 PS 1 (SUTURE) ×4 IMPLANT
SUT PDS AB 2-0 CT1 27 (SUTURE) IMPLANT
SWAB CULTURE ESWAB REG 1ML (MISCELLANEOUS) IMPLANT
SYR BULB IRRIGATION 50ML (SYRINGE) ×2 IMPLANT
TOWEL OR 17X26 10 PK STRL BLUE (TOWEL DISPOSABLE) ×2 IMPLANT
TUBE CONNECTING 12X1/4 (SUCTIONS) ×2 IMPLANT
TUBING CYSTO DISP (UROLOGICAL SUPPLIES) IMPLANT
UNDERPAD 30X30 (UNDERPADS AND DIAPERS) ×2 IMPLANT
YANKAUER SUCT BULB TIP NO VENT (SUCTIONS) IMPLANT

## 2018-05-30 NOTE — Progress Notes (Signed)
Physical Therapy Treatment Patient Details Name: Cesar ActonJohn D Ali MRN: [id number] DOB: Jun 04, 1966 Today's Date: 05/30/2018    History of Present Illness Pt is a 52 y.o. male with CKD 3, hx of CVA, IDDM, who sustained mechanical fall on 10/14 and developed left  trimalleolar ankle fracture. He underwent ORIF on 10/22. His ankle started to have drainage noted on 11/13 Dr Cesar Ali took him to the OR on 10/14 for left ankle IX D, removal of HW- all HW removed, now NWB LLE.    PT Comments    Continuing work on functional mobility and activity tolerance;  Used RW for amb today with much better compliance with NWB LLE; I am hoping he can use a RW in his home;   Stairs was one of Cesar Ali's main stated goals, so I demonstrated stairs with crutches for him, and how to ascend backwards with RW and someone steadying RW in the front; Ultimately, he politely declined practicing, telling me he would rather practice stairs postop.  I know it is possible that his insurance will not cover HHPT follow up, but I strongly emphasize that a skilled PT/OT/RN presence in the home will help to problem-solve through some barriers to mobility and ADLs with NWB LLE    Follow Up Recommendations  Home health PT;Supervision/Assistance - 24 hour     Equipment Recommendations  Wheelchair (measurements PT);Wheelchair cushion (measurements PT);Other (comment)(wheelchair will likely not fit); RW, 3in1    Recommendations for Other Services OT consult     Precautions / Restrictions Precautions Precautions: Fall Restrictions LLE Weight Bearing: Non weight bearing Other Position/Activity Restrictions: VAC    Mobility  Bed Mobility Overal bed mobility: Needs Assistance Bed Mobility: Supine to Sit     Supine to sit: Supervision     General bed mobility comments: Supervision for lines  Transfers Overall transfer level: Needs assistance Equipment used: Rolling walker (2 wheeled) Transfers: Sit to/from  Stand Sit to Stand: Min assist         General transfer comment: min guard assist for balance and safety- verbal cues for WB trhoughout session; Min assist to rise from low commode  Ambulation/Gait Ambulation/Gait assistance: Min guard Gait Distance (Feet): 20 Feet(15+5) Assistive device: Rolling walker (2 wheeled) Gait Pattern/deviations: Step-to pattern("hop-to") Gait velocity: slowed   General Gait Details: Hop-step with RW LLE touched down just a few times; Fatigues relatively quickly; We discussed use of knee walke, and I showed him how it works -- he politely declined trying it, telling me it won't fit in his home   Stairs         General stair comments: Stairs was one of Cesar Ali's main stated goals, so I demonstrated stairs with crutches for him, and how to ascend backwards with RW and someone steadying RW in the front; Ultimately, he politely declined practicing, telling me he would rather practice stairs postop   Wheelchair Mobility    Modified Rankin (Stroke Patients Only)       Balance     Sitting balance-Leahy Scale: Good       Standing balance-Leahy Scale: Poor Standing balance comment: dependent on BUE support; and difficulty keeping NWB LLE                            Cognition Arousal/Alertness: Awake/alert Behavior During Therapy: WFL for tasks assessed/performed Overall Cognitive Status: No family/caregiver present to determine baseline cognitive functioning Area of Impairment: Safety/judgement  Safety/Judgement: Decreased awareness of safety;Decreased awareness of deficits     General Comments: question Pt's ability to maintain WB status      Exercises      General Comments        Pertinent Vitals/Pain Pain Assessment: Faces Faces Pain Scale: Hurts a little bit Pain Location: LLE Pain Descriptors / Indicators: Aching;Dull;Grimacing Pain Intervention(s): Monitored during  session(elevated)    Home Living                      Prior Function            PT Goals (current goals can now be found in the care plan section) Acute Rehab PT Goals Patient Stated Goal: stated he wants to be able to manage stairs better PT Goal Formulation: With patient Time For Goal Achievement: 06/10/18 Potential to Achieve Goals: Fair Progress towards PT goals: Progressing toward goals(slowly)    Frequency    Min 6X/week      PT Plan Current plan remains appropriate    Co-evaluation              AM-PAC PT "6 Clicks" Mobility   Outcome Measure  Help needed turning from your back to your side while in a flat bed without using bedrails?: None Help needed moving from lying on your back to sitting on the side of a flat bed without using bedrails?: None Help needed moving to and from a bed to a chair (including a wheelchair)?: A Little Help needed standing up from a chair using your arms (e.g., wheelchair or bedside chair)?: A Little Help needed to walk in hospital room?: A Lot Help needed climbing 3-5 steps with a railing? : Total 6 Click Score: 17    End of Session Equipment Utilized During Treatment: Gait belt Activity Tolerance: Patient tolerated treatment well Patient left: in chair;with call bell/phone within reach Nurse Communication: Mobility status PT Visit Diagnosis: Unsteadiness on feet (R26.81);Other abnormalities of gait and mobility (R26.89);Difficulty in walking, not elsewhere classified (R26.2)     Time: 0958-1048(minus approx 10 minutes while pt in bathroom) PT Time Calculation (min) (ACUTE ONLY): 50 min  Charges:  $Gait Training: 8-22 mins $Therapeutic Activity: 23-37 mins                     Van Clines, PT  Acute Rehabilitation Services Pager [phone] Office [phone]    [name] 05/30/2018, 4:45 PM

## 2018-05-30 NOTE — Anesthesia Procedure Notes (Signed)
Procedure Name: LMA Insertion Date/Time: 05/30/2018 5:47 PM Performed by: Nandi Tonnesen T, CRNA Pre-anesthesia Checklist: Patient identified, Emergency Drugs available, Suction available and Patient being monitored Patient Re-evaluated:Patient Re-evaluated prior to induction Oxygen Delivery Method: Circle system utilized Preoxygenation: Pre-oxygenation with 100% oxygen Induction Type: IV induction Ventilation: Mask ventilation without difficulty LMA: LMA inserted LMA Size: 5.0 Number of attempts: 1 Airway Equipment and Method: Patient positioned with wedge pillow Placement Confirmation: positive ETCO2 and breath sounds checked- equal and bilateral Tube secured with: Tape Dental Injury: Teeth and Oropharynx as per pre-operative assessment

## 2018-05-30 NOTE — Progress Notes (Signed)
Patient stable and comfortable.  LLE: swelling contriolled, NVI  Plan for repeat I&D today

## 2018-05-30 NOTE — Anesthesia Preprocedure Evaluation (Signed)
Anesthesia Evaluation  Patient identified by MRN, date of birth, ID band Patient awake    Reviewed: Allergy & Precautions, NPO status , Patient's Chart, lab work & pertinent test results  Airway Mallampati: II  TM Distance: >3 FB     Dental  (+) Edentulous Upper, Edentulous Lower   Pulmonary former smoker,    breath sounds clear to auscultation       Cardiovascular hypertension,  Rhythm:Regular Rate:Normal     Neuro/Psych    GI/Hepatic   Endo/Other  diabetes  Renal/GU      Musculoskeletal   Abdominal   Peds  Hematology   Anesthesia Other Findings   Reproductive/Obstetrics                             Anesthesia Physical Anesthesia Plan  ASA: III  Anesthesia Plan: General   Post-op Pain Management:    Induction: Intravenous  PONV Risk Score and Plan: Ondansetron  Airway Management Planned: LMA  Additional Equipment:   Intra-op Plan:   Post-operative Plan:   Informed Consent: I have reviewed the patients History and Physical, chart, labs and discussed the procedure including the risks, benefits and alternatives for the proposed anesthesia with the patient or authorized representative who has indicated his/her understanding and acceptance.       Plan Discussed with: CRNA and Anesthesiologist  Anesthesia Plan Comments:         Anesthesia Quick Evaluation  

## 2018-05-30 NOTE — Progress Notes (Signed)
Patient hypertensive since arrival to PACU this evening.  Pain is now under control and patient resting comfortably.  BP still elevated.  Dr. Chilton SiGreen was notified.  Per MD if pressure rises above 170/90 may treat with 5mg  Labetalol. Will continue to monitor patient and treat accordingly.

## 2018-05-30 NOTE — Progress Notes (Signed)
[facility]  Date of Admission:  05/26/2018     Total days of antibiotics 5         ASSESSMENT/PLAN  [name] continues treatment for acute osteomyelitis of the left fibula and treated with broad spectrum vancomycin and ceftriaxone. He has remained afebrile and renal function has improved from previous. Scheduled for I&D this afternoon. Would request cultures if able to be obtained, likely not able to grow anything, but may provide guidance if needed.  1. Continue current dose of ceftriaxone and vancomycin. 2. I&D with Dr. Eulah Ali this afternoon.   Principal Problem:   Acute osteomyelitis of left fibula (HCC)   . docusate sodium  100 mg Oral BID  . enoxaparin (LOVENOX) injection  30 mg Subcutaneous Q24H  . gabapentin  300 mg Oral TID  . insulin aspart  0-15 Units Subcutaneous TID WC  . insulin detemir  15 Units Subcutaneous QHS  . mupirocin ointment  1 application Nasal BID  . sodium chloride flush  10-40 mL Intracatheter Q12H  . traMADol  50 mg Oral Q6H    SUBJECTIVE:  Afebrile overnight with no acute events or changes. Scheduled for I&D with Dr. Eulah Ali this afternoon. Creatinine improved to 1.34.   No Known Allergies   Review of Systems: Review of Systems  Constitutional: Negative for chills, fever and malaise/fatigue.  Respiratory: Negative for cough, shortness of breath and wheezing.   Cardiovascular: Negative for chest pain and leg swelling.  Gastrointestinal: Negative for abdominal pain, diarrhea, nausea and vomiting.  Neurological: Negative for weakness and headaches.  Endo/Heme/Allergies: Does not bruise/bleed easily.      OBJECTIVE: Vitals:   05/29/18 0509 05/29/18 1758 05/29/18 1938 05/30/18 0357  BP: (!) 147/92 128/86 138/81 (!) 149/81  Pulse: 92 88 90 81  Resp:  16 17 16   Temp:  97.9 F (36.6 C) 98.5 F (36.9 C) 98.4 F (36.9 C)  TempSrc:  Axillary Oral Oral  SpO2: 99% 99% 100% 100%  Weight:      Height:        Body mass index is 22.14 kg/m.  Physical Exam Constitutional:      General: He is not in acute distress.    Appearance: He is well-developed.     Comments: Lying in bed with head of bed elevated. Pleasant.   Cardiovascular:     Rate and Rhythm: Normal rate and regular rhythm.     Heart sounds: Normal heart sounds.  Pulmonary:     Effort: Pulmonary effort is normal.     Breath sounds: Normal breath sounds.  Musculoskeletal:     Comments: Left lower extremity dressing remains intact, clean and dry. Wound vac with appropriate suction.   Skin:    General: Skin is warm and dry.  Neurological:     Mental Status: He is alert.  Psychiatric:        Mood and Affect: Mood normal.        Behavior: Behavior normal.     Lab Results Lab Results  Component Value Date   WBC 7.1 05/28/2018   HGB 8.3 (L) 05/28/2018   HCT 27.0 (L) 05/28/2018   MCV 85.2 05/28/2018   PLT 391 05/28/2018    Lab Results  Component Value Date   CREATININE 1.34 (H) 05/29/2018   BUN 20 05/29/2018   NA 133 (L) 05/29/2018   K 4.9 05/29/2018   CL 101 05/29/2018   CO2 24 05/29/2018    Lab Results  Component Value  Date   ALT 39 07/20/2010   AST 24 07/20/2010   ALKPHOS 101 07/20/2010   BILITOT 0.3 07/20/2010     Microbiology: Recent Results (from the past 240 hour(s))  Surgical PCR screen     Status: None   Collection Time: 05/28/18 10:51 PM  Result Value Ref Range Status   MRSA, PCR NEGATIVE NEGATIVE Final   Staphylococcus aureus NEGATIVE NEGATIVE Final    Comment: (NOTE) The Xpert SA Assay (FDA approved for NASAL specimens in patients 25 years of age and older), is one component of a comprehensive surveillance program. It is not intended to diagnose infection nor to guide or monitor treatment. Performed at [facility], [address], [place], [place] [address]      [name], NP [facility] [phone]  Pager  05/30/2018  8:26 AM

## 2018-05-30 NOTE — Plan of Care (Signed)
  Problem: Education: Goal: Knowledge of General Education information will improve Description Including pain rating scale, medication(s)/side effects and non-pharmacologic comfort measures Outcome: Progressing   Problem: Clinical Measurements: Goal: Ability to maintain clinical measurements within normal limits will improve Outcome: Progressing   Problem: Clinical Measurements: Goal: Will remain free from infection Outcome: Progressing   Problem: Activity: Goal: Risk for activity intolerance will decrease Outcome: Progressing   Problem: Skin Integrity: Goal: Risk for impaired skin integrity will decrease Outcome: Progressing

## 2018-05-30 NOTE — Progress Notes (Signed)
OT Cancellation Note  Patient Details Name: Audelia ActonJohn D Miles MRN: [id number] DOB: [birth year]   Cancelled Treatment:    Reason Eval/Treat Not Completed: Patient at procedure or test/ unavailable(OR for repeat I&D)  Evern BioLaura J Aalyah Mansouri 05/30/2018, 3:19 PM  Sherryl MangesLaura Jobie Popp OTR/L Acute Rehabilitation Services Pager: [phone] Office: [phone]

## 2018-05-30 NOTE — Progress Notes (Addendum)
Inpatient Diabetes Program Recommendations  AACE/ADA: New Consensus Statement on Inpatient Glycemic Control (2015)  Target Ranges:  Prepandial:   less than 140 mg/dL      Peak postprandial:   less than 180 mg/dL (1-2 hours)      Critically ill patients:  140 - 180 mg/dL   Lab Results  Component Value Date   GLUCAP 146 (H) 05/30/2018   HGBA1C 11.7 (H) 05/30/2018    Patient states he has been TYPE 1 since age 53.   Home DM meds:Novolin R 2-8 units BID (with meals), Levemir 25 units BID  Current inpatient DM meds: Levemir 15 units q hs                                               Novolog moderate correction scale (0-15 units) tid   Met with patient today and shared with him his Hgb A1c of 11.7%. Explained to patient that is an average of 289 mg/dl over a 2-3 month period. (Prior to that in 2012 Hgb A1c was 13.3%.) He states he doesn't have any difficulty getting his insulin filled and says he has a CBG machine in working order and he does check it several times a day. Reminded patient that goal A1c is 7% or less per ADA standards to prevent both acute and long-term complications. Discussed current infection and explained to patient in order to heal well his blood sugar needs to be controlled well.  Explained to patient the extreme importance of good glucose control at home. Encouraged patient to check CBGs at least bid at home (fasting and another check within the day) and to record all CBGs in a logbook for PCP to review. He states he is in the process of moving to [place], [place] and is getting set up with a new PCP there. Stressed to patient the importance of establishing his care with a local MD in his new town. He is interested in "Living well with diabetes" book and speaking to a dietician here while inpatient - will order both.   Thank  You.  -- Will follow during hospitalization.--  Jonna Clark RN, MSN Diabetes Coordinator Inpatient Glycemic Control Team Team Pager:  [phone] (8am-5pm)

## 2018-05-30 NOTE — Anesthesia Postprocedure Evaluation (Signed)
Anesthesia Post Note  Patient: Cesar ActonJohn D Ali  Procedure(s) Performed: IRRIGATION AND DEBRIDEMENT LEFT ANKLE,  WOUND VAC EXCHANGE (Left Ankle)     Patient location during evaluation: PACU Anesthesia Type: General Level of consciousness: awake Pain management: pain level controlled Vital Signs Assessment: post-procedure vital signs reviewed and stable Respiratory status: spontaneous breathing Cardiovascular status: stable Postop Assessment: no apparent nausea or vomiting Anesthetic complications: no    Last Vitals:  Vitals:   05/30/18 2030 05/30/18 2045  BP: (!) 167/90 (!) 161/96  Pulse: 80 83  Resp: 10 20  Temp: 36.6 C 36.7 C  SpO2: 100% 100%    Last Pain:  Vitals:   05/30/18 2045  TempSrc: Oral  PainSc:                  Raffaele Derise

## 2018-05-30 NOTE — Transfer of Care (Signed)
Immediate Anesthesia Transfer of Care Note  Patient: Cesar ActonJohn D Ali  Procedure(s) Performed: IRRIGATION AND DEBRIDEMENT LEFT ANKLE,  WOUND VAC EXCHANGE (Left Ankle)  Patient Location: PACU  Anesthesia Type:General  Level of Consciousness: awake and alert   Airway & Oxygen Therapy: Patient Spontanous Breathing and Patient connected to nasal cannula oxygen  Post-op Assessment: Report given to RN, Post -op Vital signs reviewed and stable and Patient moving all extremities  Post vital signs: Reviewed and stable  Last Vitals:  Vitals Value Taken Time  BP 166/98 05/30/2018  6:35 PM  Temp    Pulse 78 05/30/2018  6:39 PM  Resp 8 05/30/2018  6:39 PM  SpO2 100 % 05/30/2018  6:39 PM  Vitals shown include unvalidated device data.  Last Pain:  Vitals:   05/30/18 1343  TempSrc: Oral  PainSc:       Patients Stated Pain Goal: 2 (05/30/18 1203)  Complications: No apparent anesthesia complications

## 2018-05-31 ENCOUNTER — Other Ambulatory Visit (INDEPENDENT_AMBULATORY_CARE_PROVIDER_SITE_OTHER): Payer: Self-pay | Admitting: Orthopedic Surgery

## 2018-05-31 ENCOUNTER — Encounter (HOSPITAL_COMMUNITY): Payer: Self-pay | Admitting: Orthopedic Surgery

## 2018-05-31 DIAGNOSIS — S91002D Unspecified open wound, left ankle, subsequent encounter: Secondary | ICD-10-CM

## 2018-05-31 LAB — BASIC METABOLIC PANEL
Anion gap: 8 (ref 5–15)
BUN: 13 mg/dL (ref 6–20)
CHLORIDE: 99 mmol/L (ref 98–111)
CO2: 28 mmol/L (ref 22–32)
Calcium: 8.8 mg/dL — ABNORMAL LOW (ref 8.9–10.3)
Creatinine, Ser: 1.21 mg/dL (ref 0.61–1.24)
GFR calc Af Amer: >60 mL/min (ref >60)
GFR calc non Af Amer: >60 mL/min (ref >60)
Glucose, Bld: 323 mg/dL — ABNORMAL HIGH (ref 70–99)
Potassium: 4.5 mmol/L (ref 3.5–5.1)
Sodium: 135 mmol/L (ref 135–145)

## 2018-05-31 LAB — GLUCOSE, CAPILLARY
GLUCOSE-CAPILLARY: 174 mg/dL — AB (ref 70–99)
Glucose-Capillary: 270 mg/dL — ABNORMAL HIGH (ref 70–99)
Glucose-Capillary: 236 mg/dL — ABNORMAL HIGH (ref 70–99)
Glucose-Capillary: 269 mg/dL — ABNORMAL HIGH (ref 70–99)

## 2018-05-31 LAB — VANCOMYCIN, TROUGH: Vancomycin Tr: 8 ug/mL — ABNORMAL LOW (ref 15–20)

## 2018-05-31 LAB — VANCOMYCIN, PEAK: Vancomycin Pk: 36 ug/mL (ref 30–40)

## 2018-05-31 MED ORDER — ONDANSETRON HCL 4 MG/2ML IJ SOLN: 4.0000 mg | Freq: Four times a day (QID) | INTRAMUSCULAR | Status: DC | PRN

## 2018-05-31 MED ORDER — ADULT MULTIVITAMIN W/MINERALS CH
1.0000 | ORAL_TABLET | Freq: Every day | ORAL | Status: DC
  Administered 2018-05-31 – 2018-06-05 (×5): 1 via ORAL
  Filled 2018-05-31 (×4): qty 1

## 2018-05-31 MED ORDER — ONDANSETRON HCL 4 MG PO TABS: 4.0000 mg | ORAL_TABLET | Freq: Four times a day (QID) | ORAL | Status: DC | PRN

## 2018-05-31 MED ORDER — ENSURE MAX PROTEIN PO LIQD
11.0000 [oz_av] | Freq: Two times a day (BID) | ORAL | Status: DC
  Administered 2018-05-31 – 2018-06-05 (×10): 11 [oz_av] via ORAL
  Filled 2018-05-31 (×12): qty 330

## 2018-05-31 MED ORDER — DOCUSATE SODIUM 100 MG PO CAPS: 100.0000 mg | ORAL_CAPSULE | Freq: Two times a day (BID) | ORAL | Status: DC

## 2018-05-31 NOTE — Progress Notes (Signed)
Occupational Therapy Treatment Patient Details Name: Cesar ActonJohn D Bushart MRN: [id number] DOB: 1966/03/04 Today's Date: 05/31/2018    History of present illness Pt is a 52 y.o. male with CKD 3, hx of CVA, IDDM, who sustained mechanical fall on 10/14 and developed left  trimalleolar ankle fracture. He underwent ORIF on 10/22. His ankle started to have drainage noted on 11/13 Dr Dion SaucierLandau took him to the OR on 10/14 for left ankle IX D, removal of HW- all HW removed, now NWB LLE.   OT comments  Pt progressing towards OT goals this session. Improvement with NWB status throughout session. Pt provided with positive praise and encouragement. Pt able to perform toilet transfer at min A with LOB x2 requiring therapist intervention. He continues to benefit from skilled OT in the acute setting and REALLY needs 24 hour supervision as well as HH therapies for safety and independence in his home environment. Next session to focus on tub transfer and ensure proper DME for tub.    Follow Up Recommendations  Home health OT    Equipment Recommendations  Tub/shower seat    Recommendations for Other Services      Precautions / Restrictions Precautions Precautions: Fall Restrictions Weight Bearing Restrictions: Yes LLE Weight Bearing: Non weight bearing Other Position/Activity Restrictions: VAC       Mobility Bed Mobility Overal bed mobility: Needs Assistance Bed Mobility: Supine to Sit     Supine to sit: Supervision     General bed mobility comments: Supervision for lines  Transfers Overall transfer level: Needs assistance Equipment used: Rolling walker (2 wheeled) Transfers: Sit to/from Stand Sit to Stand: Min assist         General transfer comment: min guard assist for balance and safety- verbal cues for WB trhoughout session; Min assist to rise from low commode    Balance Overall balance assessment: Needs assistance Sitting-balance support: No upper extremity supported;Feet  supported Sitting balance-Leahy Scale: Good       Standing balance-Leahy Scale: Poor Standing balance comment: dependent on BUE support; but able to maintain NWB this session with encouragement and cues                           ADL either performed or assessed with clinical judgement   ADL Overall ADL's : Needs assistance/impaired Eating/Feeding: Independent Eating/Feeding Details (indicate cue type and reason): in recliner at end of session Grooming: Set up;Sitting;Wash/dry hands;Wash/dry face Grooming Details (indicate cue type and reason): in recliner                 Toilet Transfer: Minimal assistance;Ambulation;RW Toilet Transfer Details (indicate cue type and reason): LOB requiring therapist intervention x2 with RW. decreased safety awareness Toileting- Clothing Manipulation and Hygiene: Set up;Sitting/lateral lean       Functional mobility during ADLs: Minimal assistance;Cueing for sequencing;Rolling walker       Vision       Perception     Praxis      Cognition Arousal/Alertness: Awake/alert Behavior During Therapy: WFL for tasks assessed/performed Overall Cognitive Status: No family/caregiver present to determine baseline cognitive functioning Area of Impairment: Safety/judgement                         Safety/Judgement: Decreased awareness of safety;Decreased awareness of deficits     General Comments: question Pt's ability to maintain WB status        Exercises     Shoulder Instructions  General Comments      Pertinent Vitals/ Pain       Pain Assessment: Faces Faces Pain Scale: Hurts a little bit Pain Location: LLE Pain Descriptors / Indicators: Aching;Dull;Grimacing Pain Intervention(s): Monitored during session;Repositioned  Home Living                                          Prior Functioning/Environment              Frequency  Min 3X/week        Progress Toward  Goals  OT Goals(current goals can now be found in the care plan section)  Progress towards OT goals: Progressing toward goals  Acute Rehab OT Goals Patient Stated Goal: to go home and be safe OT Goal Formulation: With patient Time For Goal Achievement: 06/11/18 Potential to Achieve Goals: Good ADL Goals Pt Will Perform Lower Body Bathing: with set-up;sitting/lateral leans Pt Will Perform Lower Body Dressing: with min guard assist;with caregiver independent in assisting;sit to/from stand Pt Will Transfer to Toilet: with modified independence;ambulating Pt Will Perform Toileting - Clothing Manipulation and hygiene: with modified independence;sitting/lateral leans Pt Will Perform Tub/Shower Transfer: Tub transfer;with min guard assist;with caregiver independent in assisting;shower seat;3 in 1;rolling walker  Plan Discharge plan remains appropriate;Frequency remains appropriate    Co-evaluation                 AM-PAC OT "6 Clicks" Daily Activity     Outcome Measure   Help from another person eating meals?: None Help from another person taking care of personal grooming?: None(insitting) Help from another person toileting, which includes using toliet, bedpan, or urinal?: A Little Help from another person bathing (including washing, rinsing, drying)?: A Little Help from another person to put on and taking off regular upper body clothing?: None Help from another person to put on and taking off regular lower body clothing?: A Little 6 Click Score: 21    End of Session Equipment Utilized During Treatment: Gait belt;Rolling walker  OT Visit Diagnosis: Unsteadiness on feet (R26.81);Other abnormalities of gait and mobility (R26.89);History of falling (Z91.81);Pain Pain - Right/Left: Left Pain - part of body: Ankle and joints of foot   Activity Tolerance Patient tolerated treatment well   Patient Left in chair;with call bell/phone within reach;with nursing/sitter in room   Nurse  Communication Mobility status;Precautions;Weight bearing status        Time: [id number] OT Time Calculation (min): 25 min  Charges: OT General Charges $OT Visit: 1 Visit OT Treatments $Self Care/Home Management : 23-37 mins  Sherryl MangesLaura Kalup Jaquith OTR/L Acute Rehabilitation Services Pager: [phone] Office: [phone]   Evern BioLaura J Lavi Sheehan 05/31/2018, 5:01 PM

## 2018-05-31 NOTE — Progress Notes (Signed)
Initial Nutrition Assessment  DOCUMENTATION CODES:   Severe malnutrition in context of chronic illness, Underweight  INTERVENTION:    Ensure Max PO BID, each supplement provides 150 kcal and 30 gm protein  Multivitamin daily  NUTRITION DIAGNOSIS:   Severe Malnutrition related to acute illness(ankle fracture s/p surgery with wound dehiscence/infection) as evidenced by percent weight loss, severe muscle depletion(8% weight loss within 3 months).  GOAL:   Patient will meet greater than or equal to 90% of their needs  MONITOR:   PO intake, Supplement acceptance, Skin, Labs  REASON FOR ASSESSMENT:   Consult Diet education  ASSESSMENT:   52 yo male with PMH of HTN, HLD, ankle fracture, CKD stage III, DM-1, and CVA who was admitted with left ankle infection and wound dehiscence s/p ORIF 2 months ago.   Patient reports that he has lost ~20 lbs in the past few months. He was eating poorly while staying with his brother after ankle surgery 2 months ago, then he moved back to [place] and has not been eating well here either. He denies trouble chewing and swallowing.   Weight down from 79.4 kg 2 months ago to 73 kg on admission. 8% weight loss is significant within a 3 month time frame.  RD consulted for nutrition education regarding diabetes. Patient states usual insulin regimen is Levemir BID and sliding scale (based on blood sugar) TID. He does not cover the carbohydrates that he eats at meals.   Lab Results  Component Value Date   HGBA1C 11.7 (H) 05/30/2018    RD provided "Type 1 Diabetes Nutrition Therapy" handout from the Academy of Nutrition and Dietetics. Provided list of carbohydrates and recommended serving sizes of common foods. Encouraged intake of high-fiber, whole grain complex carbohydrates. Teach back method used.  Labs reviewed. CBG's: 118-110-270 Medications reviewed and include novolog and levemir.   NUTRITION - FOCUSED PHYSICAL EXAM:    Most Recent  Value  Orbital Region  No depletion  Upper Arm Region  Moderate depletion  Thoracic and Lumbar Region  Mild depletion  Buccal Region  No depletion  Temple Region  Mild depletion  Clavicle Bone Region  Mild depletion  Clavicle and Acromion Bone Region  Mild depletion  Scapular Bone Region  Mild depletion  Dorsal Hand  Mild depletion  Patellar Region  Severe depletion  Anterior Thigh Region  Severe depletion  Posterior Calf Region  Severe depletion  Edema (RD Assessment)  None  Hair  Reviewed  Eyes  Reviewed  Mouth  Reviewed  Skin  Reviewed  Nails  Reviewed       Diet Order:   Diet Order            Diet regular Room service appropriate? Yes; Fluid consistency: Thin  Diet effective now              EDUCATION NEEDS:   Education needs have been addressed  Skin:  Skin Assessment: Skin Integrity Issues: Skin Integrity Issues:: Incisions Incisions: L ankle wound dehiscence S/P I&D  Last BM:  12/18  Height:   Ht Readings from Last 1 Encounters:  05/26/18 5' 11.5" (1.816 m)    Weight:   Wt Readings from Last 1 Encounters:  05/26/18 73 kg    Ideal Body Weight:  79.5 kg  BMI:  Body mass index is 22.14 kg/m.  Estimated Nutritional Needs:   Kcal:  2100-2300  Protein:  100-120 gm  Fluid:  2.1-2.3 L    Joaquin CourtsKimberly , RD, LDN, CNSC Pager [phone] After  Hours Pager [phone]

## 2018-05-31 NOTE — Progress Notes (Signed)
Physical Therapy Treatment Patient Details Name: Cesar ActonJohn D Ali MRN: [id number] DOB: 1965-06-25 Today's Date: 05/31/2018    History of Present Illness Pt is a 52 y.o. male with CKD 3, hx of CVA, IDDM, who sustained mechanical fall on 10/14 and developed left  trimalleolar ankle fracture. He underwent ORIF on 10/22. His ankle started to have drainage noted on 11/13 Dr Dion SaucierLandau took him to the OR on 10/14 for left ankle IX D, removal of HW- all HW removed, now NWB LLE.    PT Comments    Pt up in chair just finished with dinner, worked with OT prior to that. Pt requested not getting out of recliner, but agreeable to LE exercises in chair. Pt states he is having surgery again tomorrow and hopes to go home soon. D/c plans appropriate at this time. PT to follow back after surgery.    Follow Up Recommendations  Home health PT;Supervision/Assistance - 24 hour     Equipment Recommendations  Wheelchair (measurements PT);Wheelchair cushion (measurements PT);       Precautions / Restrictions Precautions Precautions: Fall Restrictions Weight Bearing Restrictions: Yes LLE Weight Bearing: Non weight bearing Other Position/Activity Restrictions: VAC    Mobility  Bed Mobility Overal bed mobility: Needs Assistance Bed Mobility: Supine to Sit     Supine to sit: Supervision     General bed mobility comments: Supervision for lines  Transfers Overall transfer level: Needs assistance Equipment used: Rolling walker (2 wheeled) Transfers: Sit to/from Stand Sit to Stand: Min assist         General transfer comment: min guard assist for balance and safety- verbal cues for WB trhoughout session; Min assist to rise from low commode        Balance Overall balance assessment: Needs assistance Sitting-balance support: No upper extremity supported Sitting balance-Leahy Scale: Good       Standing balance-Leahy Scale: Poor Standing balance comment: dependent on BUE support; but able to  maintain NWB this session with encouragement and cues                            Cognition Arousal/Alertness: Awake/alert Behavior During Therapy: WFL for tasks assessed/performed Overall Cognitive Status: No family/caregiver present to determine baseline cognitive functioning Area of Impairment: Safety/judgement                         Safety/Judgement: Decreased awareness of safety;Decreased awareness of deficits     General Comments: question Pt's ability to maintain WB status      Exercises General Exercises - Upper Extremity Chair Push Up: PROM;10 reps;Seated General Exercises - Lower Extremity Quad Sets: AROM;Both;10 reps;Seated Gluteal Sets: AROM;Both;10 reps;Seated Short Arc Quad: AROM;Both;10 reps;Seated Heel Slides: AROM;Both;10 reps;Seated Hip ABduction/ADduction: AROM;Both;10 reps;Seated Straight Leg Raises: AROM;Both;10 reps;Seated    General Comments General comments (skin integrity, edema, etc.): Dressing intact, wound vac on and working      Pertinent Vitals/Pain Pain Assessment: Faces Faces Pain Scale: No hurt Pain Location: LLE Pain Descriptors / Indicators: Aching;Dull;Grimacing Pain Intervention(s): Monitored during session;Limited activity within patient's tolerance;Repositioned           PT Goals (current goals can now be found in the care plan section) Acute Rehab PT Goals Patient Stated Goal: stated he wants to be able to manage stairs better PT Goal Formulation: With patient Time For Goal Achievement: 06/10/18 Potential to Achieve Goals: Fair Progress towards PT goals: Not progressing toward goals -  comment(pt requested not getting out of chair agreeable to exercise)    Frequency    Min 6X/week      PT Plan Current plan remains appropriate       AM-PAC PT "6 Clicks" Mobility   Outcome Measure  Help needed turning from your back to your side while in a flat bed without using bedrails?: None Help needed  moving from lying on your back to sitting on the side of a flat bed without using bedrails?: None Help needed moving to and from a bed to a chair (including a wheelchair)?: A Little Help needed standing up from a chair using your arms (e.g., wheelchair or bedside chair)?: A Little Help needed to walk in hospital room?: A Lot Help needed climbing 3-5 steps with a railing? : Total 6 Click Score: 17    End of Session Equipment Utilized During Treatment: Gait belt Activity Tolerance: Patient tolerated treatment well Patient left: in chair;with call bell/phone within reach Nurse Communication: Mobility status PT Visit Diagnosis: Unsteadiness on feet (R26.81);Other abnormalities of gait and mobility (R26.89);Difficulty in walking, not elsewhere classified (R26.2)     Time: [id number] PT Time Calculation (min) (ACUTE ONLY): 9 min  Charges:  $Therapeutic Exercise: 8-22 mins                     Rubin Dais B. Beverely RisenVan Ali PT, DPT Acute Rehabilitation Services Pager [phone] Office [phone]    Cesar Ali 05/31/2018, 5:27 PM

## 2018-05-31 NOTE — H&P (View-Only) (Signed)
ORTHOPAEDIC CONSULTATION  REQUESTING PHYSICIAN: Sheral ApleyMurphy, Timothy D, MD  Chief Complaint: Dehiscence surgical incision status post ORIF ankle fracture.  HPI: Cesar Ali is a 52 y.o. 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  Patient has undergone excellent wound care with removal of the hardware and subsequent irrigation and debridement.  Past Medical History:  Diagnosis Date  . Acute osteomyelitis of left fibula (HCC) 05/26/2018  . Ankle fracture, left   . Anticoagulant long-term use    plavix  . CKD (chronic kidney disease), stage III [facility](HCC)    nephrologist-- dr Hyman Hopeswebb  . Full dentures   . History of CVA with residual deficit 2010   x3  march 2010;  sept 2010;  oct 2010---  residual right side weakness, per pt does not need cane  . Hyperlipidemia   . Hypertension   . Insulin dependent type 1 diabetes mellitus (HCC)    followed by pcp-- dr Greggory Stalliongeorge kilpatrick  . Stroke [facility](HCC)    Past Surgical History:  Procedure Laterality Date  . CLOSED REDUCTION FINGER WITH PERCUTANEOUS PINNING Right 2003   left ring finger  . HARDWARE REMOVAL Left 05/26/2018   Procedure: HARDWARE REMOVAL, LEFT ANKLE;  Surgeon: Teryl LucyLandau, Joshua, MD;  Location: [facility] OR;  Service: Orthopedics;  Laterality: Left;  . I&D EXTREMITY Left 05/26/2018   Procedure: IRRIGATION AND DEBRIDEMENT, LEFT ANKLE;  Surgeon: Teryl LucyLandau, Joshua, MD;  Location: [facility] OR;  Service: Orthopedics;  Laterality: Left;  . I&D EXTREMITY Left 05/30/2018   Procedure: IRRIGATION AND DEBRIDEMENT LEFT ANKLE,  WOUND VAC EXCHANGE;  Surgeon: Sheral ApleyMurphy, Timothy D, MD;  Location: [facility] OR;  Service: Orthopedics;  Laterality: Left;  . IR FLUORO GUIDE CV LINE RIGHT  05/28/2018  . IR US GUIDE VASC ACCESS RIGHT  05/28/2018  . MULTIPLE TOOTH EXTRACTIONS  03-22-2011   dr Teola Bradleyjenson @[facility]   . ORIF ANKLE FRACTURE Left 04/03/2018   Procedure: OPEN REDUCTION INTERNAL FIXATION (ORIF)  LEFT ANKLE FRACTURE;  Surgeon: Sheral ApleyMurphy, Timothy D, MD;  [facility];  Service: Orthopedics;  Laterality: Left;  . PARS PLANA VITRECTOMY  02/01/2012   Procedure: PARS PLANA VITRECTOMY WITH 23 GAUGE;  Surgeon: Shade FloodGreer Geiger, MD;  [facility];  Service: Ophthalmology;  Laterality: Left;   Social History   Socioeconomic History  . Marital status: Single    Spouse name: Not on file  . Number of children: Not on file  . Years of education: Not on file  . Highest education level: Not on file  Occupational History  . Not on file  Social Needs  . Financial resource strain: Not on file  . Food insecurity:    Worry: Not on file    Inability: Not on file  . Transportation needs:    Medical: Not on file    Non-medical: Not on file  Tobacco Use  . Smoking status: Former Smoker    Years: 10.00    Types: Cigarettes    Last attempt to quit: 03/30/2003    Years since quitting: 15.1  . Smokeless tobacco: Never Used  Substance and Sexual Activity  . Alcohol use: Not Currently  . Drug use: Not Currently    Comment: 03-29-2018 per pt last used cocaine 2004  . Sexual activity: Not on file  Lifestyle  . Physical activity:    Days per week: Not on file    Minutes per session: Not on  file  . Stress: Not on file  Relationships  . Social connections:    Talks on phone: Not on file    Gets together: Not on file    Attends religious service: Not on file    Active member of club or organization: Not on file    Attends meetings of clubs or organizations: Not on file    Relationship status: Not on file  Other Topics Concern  . Not on file  Social History Narrative  . Not on file   History reviewed. No pertinent family history. - negative except otherwise stated in the family history section No Known Allergies Prior to Admission medications   Medication Sig Start Date End Date Taking? Authorizing Provider  amLODipine  (NORVASC) 10 MG tablet Take 10 mg by mouth every morning.    Yes [provider]  aspirin EC 81 MG tablet Take 81 mg by mouth daily.   Yes [provider]  atorvastatin (LIPITOR) 10 MG tablet Take 10 mg by mouth every morning.    Yes [provider]  cephALEXin (KEFLEX) 500 MG capsule Take 1,000 mg by mouth 2 (two) times daily.   Yes [provider]  clopidogrel (PLAVIX) 75 MG tablet Take 75 mg by mouth every morning.    Yes [provider]  furosemide (LASIX) 40 MG tablet Take 40 mg by mouth every morning.    Yes [provider]  gabapentin (NEURONTIN) 300 MG capsule Take 1 capsule (300 mg total) by mouth 2 (two) times daily for 14 days. For 2 weeks post op for pain. 04/03/18 05/26/18 Yes Martensen, Lucretia Kern III, PA-C  insulin regular (NOVOLIN R,HUMULIN R) 100 units/mL injection Inject into the skin 2 (two) times daily before a meal. Per sliding scale. 200-250= 2 units, 251-300= 4 units, 301-350= 6 units, 351-400= 8 units.    Yes [provider]  LEVEMIR 100 UNIT/ML injection Inject 25 Units into the skin 2 (two) times daily. 03/14/18  Yes [provider]  lisinopril (PRINIVIL,ZESTRIL) 10 MG tablet Take 10 mg by mouth every morning.    Yes [provider]  quinapril (ACCUPRIL) 10 MG tablet Take 10 mg by mouth every morning.    Yes [provider]  sulfamethoxazole-trimethoprim (BACTRIM DS,SEPTRA DS) 800-160 MG tablet Take 1 tablet by mouth 2 (two) times daily.   Yes [provider]  docusate sodium (COLACE) 100 MG capsule Take 1 capsule (100 mg total) by mouth 2 (two) times daily. To prevent constipation while taking pain medication. Patient not taking: Reported on 05/26/2018 04/03/18   Albina Billet III, PA-C  methocarbamol (ROBAXIN) 500 MG tablet Take 1 tablet (500 mg total) by mouth every 8 (eight) hours as needed for muscle spasms. 04/03/18   Martensen, Lucretia Kern III, PA-C    ondansetron (ZOFRAN) 4 MG tablet Take 1 tablet (4 mg total) by mouth every 8 (eight) hours as needed for nausea or vomiting. 04/03/18   Albina Billet III, PA-C   No results found. - pertinent xrays, CT, MRI studies were reviewed and independently interpreted  Positive ROS: All other systems have been reviewed and were otherwise negative with the exception of those mentioned in the HPI and as above.  Physical Exam: General: Alert, no acute distress Psychiatric: Patient is competent for consent with normal mood and affect Lymphatic: No axillary or cervical lymphadenopathy Cardiovascular: No pedal edema Respiratory: No cyanosis, no use of accessory musculature GI: No organomegaly, abdomen is soft and non-tender  Images:  @ENCIMAGES @  Labs:  Lab Results  Component Value Date   HGBA1C 11.7 (H) 05/30/2018   HGBA1C 13.3 07/14/2010   ESRSEDRATE 40 (H) 05/28/2018   ESRSEDRATE 50 (H) 05/26/2018   CRP <0.8 05/28/2018   CRP 1.0 (H) 05/26/2018    Lab Results  Component Value Date   ALBUMIN 4.3 07/20/2010    Neurologic: Patient does not have protective sensation bilateral lower extremities.   MUSCULOSKELETAL:   Skin: Examination there is some maceration from the drainage around the incision.  There is exposed bone a large hematoma in the wound bed.  There is no ascending cellulitis there is minimal swelling.  Ankle-brachial indices are reviewed which shows biphasic flow with an ABI of 1.  Patient has poorly controlled diabetes with a hemoglobin A1c ranging from 13.3 to currently 11.7.  Assessment: Assessment: Uncontrolled type 1 diabetes with wound dehiscence and exposed bone left ankle.  Plan: Plan: We will plan for return to the operating room with anticipated application of split-thickness skin graft and a cleanse choice wound VAC.  Risks and benefits were discussed including persistent infection need for additional surgery.  Thank you for the consult and the  opportunity to see Cesar Ali  Gabrial Poppell, MD [facility] [phone] 7:58 AM

## 2018-05-31 NOTE — Op Note (Signed)
05/30/2018  12:13 PM  PATIENT:  Cesar Ali    PRE-OPERATIVE DIAGNOSIS:  Left ankle wound  POST-OPERATIVE DIAGNOSIS:  Same  PROCEDURE:  IRRIGATION AND DEBRIDEMENT LEFT ANKLE,  WOUND VAC EXCHANGE  SURGEON:  Sheral Apleyimothy D Brevin Mcfadden, MD  ASSISTANT: none  ANESTHESIA:   gen  PREOPERATIVE INDICATIONS:  Cesar ActonJohn D Kyer is a  [age] y.o. male with a diagnosis of Left ankle wound who failed conservative measures and elected for surgical management.    The risks benefits and alternatives were discussed with the patient preoperatively including but not limited to the risks of infection, bleeding, nerve injury, cardiopulmonary complications, the need for revision surgery, among others, and the patient was willing to proceed.  OPERATIVE IMPLANTS: none  OPERATIVE FINDINGS: no purulence  BLOOD LOSS: min  COMPLICATIONS: none  TOURNIQUET TIME: none  OPERATIVE PROCEDURE:  Patient was identified in the preoperative holding area and site was marked by me He was transported to the operating theater and placed on the table in supine position taking care to pad all bony prominences. After a preincinduction time out anesthesia was induced. The left lower extremity was prepped and draped in normal sterile fashion and a pre-incision timeout was performed. He received ancef for preoperative antibiotics.   I removed his previous wound VAC and stitches prior to prepping and draping.  I performed a thorough irrigation of his medial as well as lateral wound edge but debrided devitalized bone and muscle on the lateral side.  This was done with a rondure  I curetted the site of his Endobutton on the medial side of the tibia.  I then performed a second thorough irrigation I placed retention stitches followed by special foam on the bone and wound VAC.  Sterile dressing was applied he was then awoken and taken to the PACU in stable condition  POST OPERATIVE PLAN: Nonweightbearing left lower extremity Dr. Lajoyce Cornersuda was  planning on performing a consult and likely skin grafting.

## 2018-05-31 NOTE — Progress Notes (Signed)
    [place] for Infectious Disease  Date of Admission:  05/26/2018           Diagnosis: Osteomyelitis   Culture Result: None  No Known Allergies  OPAT Orders Discharge antibiotics: Ceftaroline    Aim for Vancomycin trough 15-20 (unless otherwise indicated) Duration: 6 weeks End Date: 07/11/18  [facility] Care Per Protocol:  Labs weekly while on IV antibiotics: _X_ CBC with differential __ BMP _X_ CMP _X_ CRP _X_ ESR __ Vancomycin trough __ CK  _X_ Please pull PIC at completion of IV antibiotics __ Please leave PIC in place until doctor has seen patient or been notified  Fax weekly labs to [phone]  Clinic Follow Up Appt:  9:30 on 07/09/17 with Terri Piedra, NP.     Terri Piedra, NP [facility] [phone] Pager  05/31/2018  5:38 PM

## 2018-05-31 NOTE — Progress Notes (Signed)
Pharmacy Antibiotic Note  Cesar Ali is a 52 y.o. male admitted on 05/26/2018 with osteomyelitis. He sustained a mechanical fall on 03/26/18 and developed left ankle fracture. He underwent ORIF on 04/03/18 and started to note drainage on 04/25/18. He was started on PO antibiotics and sent to wound care for management. The wound continued to have dehiscence, purulent drainage, and mild erythema around the wound. He is now s/p left ankle I&D and removal of hardware on 05/26/18. Renal function improving. Trough back at 8 (~25 hour level), Peak back at 36 (2 hour level). AUC calculated at 481.6.   Plan: Continue vanc 1250mg  IV Q24H CTX 2gm IV Q24H per MD Monitor renal fxn, clinical progress   Height: 5' 11.5" (181.6 cm) Weight: 161 lb (73 kg) IBW/kg (Calculated) : 76.45  Temp (24hrs), Avg:98.2 F (36.8 C), Min:97.9 F (36.6 C), Max:98.8 F (37.1 C)  Recent Labs  Lab 05/26/18 1629 05/27/18 0122 05/28/18 0319 05/29/18 1157 05/30/18 1050 05/31/18 0216 05/31/18 1310 05/31/18 1544  WBC 12.5* 11.1* 7.1  --   --   --   --   --   CREATININE 1.79*  --  1.83* 1.34* 1.27* 1.21  --   --   VANCOTROUGH  --   --   --   --   --   --  8*  --   VANCOPEAK  --   --   --   --   --   --   --  36    Estimated Creatinine Clearance: 74.6 mL/min (by C-G formula based on SCr of 1.21 mg/dL).    No Known Allergies   Vanc 12/14>> CTX 12/14>> Keflex 12/6 PTA >> 12/14 Bactrim 12/6 PTA >> 12/14   Thank you for allowing us to participate in this patients care.   Signe Coltonya C Aurora Rody, PharmD Please utilize [facility] (under [facility]) for appropriate number for your unit pharmacist. 05/31/2018 5:44 PM

## 2018-05-31 NOTE — Care Management Note (Signed)
Case Management Note  Patient Details  Name: Cesar Ali MRN: [id number] Date of Birth: 09-Jun-1966  Subjective/Objective:   52 yr old gentleman admitted with osteomyelitis of left fibula.Patient s/p I & D x 2 with hardware removal. Will likely return to OR for  anticipated application of split-thickness skin graft and a cleanse choice wound VAC.             Action/Plan: Case manager spoke with patient concerning discharge plan. Choice for Home Health Agency was discussed. [facility] will be following for IV antibiotics. CM confirmed with Jeri ModenaPam Chandler, [facility] IV specialist. Home Health RN and PT [facility] provided [facility] [place], [name], [name]. Will have support from family, lives with his brother,[name].     Expected Discharge Date:    pending             Expected Discharge Plan:  Home w Home Health Services  In-House Referral:  NA  Discharge planning Services  CM Consult  Post Acute Care Choice:  Home Health Choice offered to:  Patient  DME Arranged:  IV pump/equipment DME Agency:  [facility]  [facility]  [facility]  Status of Service:  In process, will continue to follow  If discussed at Long Length of Stay Meetings, dates discussed:    Additional Comments:  Durenda GuthrieBrady, Lamonda Noxon Naomi, RN 05/31/2018, 10:50 AM

## 2018-05-31 NOTE — Consult Note (Signed)
  ORTHOPAEDIC CONSULTATION  REQUESTING PHYSICIAN: Murphy, Timothy D, MD  Chief Complaint: Dehiscence surgical incision status post ORIF ankle fracture.  HPI: Cesar Ali is a 52 y.o. 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  Patient has undergone excellent wound care with removal of the hardware and subsequent irrigation and debridement.  Past Medical History:  Diagnosis Date  . Acute osteomyelitis of left fibula (HCC) 05/26/2018  . Ankle fracture, left   . Anticoagulant long-term use    plavix  . CKD (chronic kidney disease), stage III (HCC)    nephrologist-- dr webb  . Full dentures   . History of CVA with residual deficit 2010   x3  march 2010;  sept 2010;  oct 2010---  residual right side weakness, per pt does not need cane  . Hyperlipidemia   . Hypertension   . Insulin dependent type 1 diabetes mellitus (HCC)    followed by pcp-- dr george kilpatrick  . Stroke (HCC)    Past Surgical History:  Procedure Laterality Date  . CLOSED REDUCTION FINGER WITH PERCUTANEOUS PINNING Right 2003   left ring finger  . HARDWARE REMOVAL Left 05/26/2018   Procedure: HARDWARE REMOVAL, LEFT ANKLE;  Surgeon: Landau, Joshua, MD;  Location: [facility] OR;  Service: Orthopedics;  Laterality: Left;  . I&D EXTREMITY Left 05/26/2018   Procedure: IRRIGATION AND DEBRIDEMENT, LEFT ANKLE;  Surgeon: Landau, Joshua, MD;  Location: [facility] OR;  Service: Orthopedics;  Laterality: Left;  . I&D EXTREMITY Left 05/30/2018   Procedure: IRRIGATION AND DEBRIDEMENT LEFT ANKLE,  WOUND VAC EXCHANGE;  Surgeon: Murphy, Timothy D, MD;  Location: [facility] OR;  Service: Orthopedics;  Laterality: Left;  . IR FLUORO GUIDE CV LINE RIGHT  05/28/2018  . IR US GUIDE VASC ACCESS RIGHT  05/28/2018  . MULTIPLE TOOTH EXTRACTIONS  03-22-2011   dr jenson @[facility]  . ORIF ANKLE FRACTURE Left 04/03/2018   Procedure: OPEN REDUCTION INTERNAL FIXATION (ORIF)  LEFT ANKLE FRACTURE;  Surgeon: Murphy, Timothy D, MD;  Location: [facility];  Service: Orthopedics;  Laterality: Left;  . PARS PLANA VITRECTOMY  02/01/2012   Procedure: PARS PLANA VITRECTOMY WITH 23 GAUGE;  Surgeon: Greer Geiger, MD;  Location: [facility] OR;  Service: Ophthalmology;  Laterality: Left;   Social History   Socioeconomic History  . Marital status: Single    Spouse name: Not on file  . Number of children: Not on file  . Years of education: Not on file  . Highest education level: Not on file  Occupational History  . Not on file  Social Needs  . Financial resource strain: Not on file  . Food insecurity:    Worry: Not on file    Inability: Not on file  . Transportation needs:    Medical: Not on file    Non-medical: Not on file  Tobacco Use  . Smoking status: Former Smoker    Years: 10.00    Types: Cigarettes    Last attempt to quit: 03/30/2003    Years since quitting: 15.1  . Smokeless tobacco: Never Used  Substance and Sexual Activity  . Alcohol use: Not Currently  . Drug use: Not Currently    Comment: 03-29-2018 per pt last used cocaine 2004  . Sexual activity: Not on file  Lifestyle  . Physical activity:    Days per week: Not on file    Minutes per session: Not on   file  . Stress: Not on file  Relationships  . Social connections:    Talks on phone: Not on file    Gets together: Not on file    Attends religious service: Not on file    Active member of club or organization: Not on file    Attends meetings of clubs or organizations: Not on file    Relationship status: Not on file  Other Topics Concern  . Not on file  Social History Narrative  . Not on file   History reviewed. No pertinent family history. - negative except otherwise stated in the family history section No Known Allergies Prior to Admission medications   Medication Sig Start Date End Date Taking? Authorizing Provider  amLODipine  (NORVASC) 10 MG tablet Take 10 mg by mouth every morning.    Yes [provider]  aspirin EC 81 MG tablet Take 81 mg by mouth daily.   Yes [provider]  atorvastatin (LIPITOR) 10 MG tablet Take 10 mg by mouth every morning.    Yes [provider]  cephALEXin (KEFLEX) 500 MG capsule Take 1,000 mg by mouth 2 (two) times daily.   Yes [provider]  clopidogrel (PLAVIX) 75 MG tablet Take 75 mg by mouth every morning.    Yes [provider]  furosemide (LASIX) 40 MG tablet Take 40 mg by mouth every morning.    Yes [provider]  gabapentin (NEURONTIN) 300 MG capsule Take 1 capsule (300 mg total) by mouth 2 (two) times daily for 14 days. For 2 weeks post op for pain. 04/03/18 05/26/18 Yes Martensen, [name] Calvin III, PA-C  insulin regular (NOVOLIN R,HUMULIN R) 100 units/mL injection Inject into the skin 2 (two) times daily before a meal. Per sliding scale. 200-250= 2 units, 251-300= 4 units, 301-350= 6 units, 351-400= 8 units.    Yes [provider]  LEVEMIR 100 UNIT/ML injection Inject 25 Units into the skin 2 (two) times daily. 03/14/18  Yes [provider]  lisinopril (PRINIVIL,ZESTRIL) 10 MG tablet Take 10 mg by mouth every morning.    Yes [provider]  quinapril (ACCUPRIL) 10 MG tablet Take 10 mg by mouth every morning.    Yes [provider]  sulfamethoxazole-trimethoprim (BACTRIM DS,SEPTRA DS) 800-160 MG tablet Take 1 tablet by mouth 2 (two) times daily.   Yes [provider]  docusate sodium (COLACE) 100 MG capsule Take 1 capsule (100 mg total) by mouth 2 (two) times daily. To prevent constipation while taking pain medication. Patient not taking: Reported on 05/26/2018 04/03/18   Martensen, [name] Calvin III, PA-C  methocarbamol (ROBAXIN) 500 MG tablet Take 1 tablet (500 mg total) by mouth every 8 (eight) hours as needed for muscle spasms. 04/03/18   Martensen, [name] Calvin III, PA-C    ondansetron (ZOFRAN) 4 MG tablet Take 1 tablet (4 mg total) by mouth every 8 (eight) hours as needed for nausea or vomiting. 04/03/18   Martensen, [name] Calvin III, PA-C   No results found. - pertinent xrays, CT, MRI studies were reviewed and independently interpreted  Positive ROS: All other systems have been reviewed and were otherwise negative with the exception of those mentioned in the HPI and as above.  Physical Exam: General: Alert, no acute distress Psychiatric: Patient is competent for consent with normal mood and affect Lymphatic: No axillary or cervical lymphadenopathy Cardiovascular: No pedal edema Respiratory: No cyanosis, no use of accessory musculature GI: No organomegaly, abdomen is soft and non-tender      Images:  @ENCIMAGES@  Labs:  Lab Results  Component Value Date   HGBA1C 11.7 (H) 05/30/2018   HGBA1C 13.3 07/14/2010   ESRSEDRATE 40 (H) 05/28/2018   ESRSEDRATE 50 (H) 05/26/2018   CRP <0.8 05/28/2018   CRP 1.0 (H) 05/26/2018    Lab Results  Component Value Date   ALBUMIN 4.3 07/20/2010    Neurologic: Patient does not have protective sensation bilateral lower extremities.   MUSCULOSKELETAL:   Skin: Examination there is some maceration from the drainage around the incision.  There is exposed bone a large hematoma in the wound bed.  There is no ascending cellulitis there is minimal swelling.  Ankle-brachial indices are reviewed which shows biphasic flow with an ABI of 1.  Patient has poorly controlled diabetes with a hemoglobin A1c ranging from 13.3 to currently 11.7.  Assessment: Assessment: Uncontrolled type 1 diabetes with wound dehiscence and exposed bone left ankle.  Plan: Plan: We will plan for return to the operating room with anticipated application of split-thickness skin graft and a cleanse choice wound VAC.  Risks and benefits were discussed including persistent infection need for additional surgery.  Thank you for the consult and the  opportunity to see Cesar Ali   , MD [facility] [phone] 7:58 AM     

## 2018-05-31 NOTE — Plan of Care (Signed)
  Problem: Education: Goal: Knowledge of General Education information will improve Description Including pain rating scale, medication(s)/side effects and non-pharmacologic comfort measures Outcome: Progressing   Problem: Clinical Measurements: Goal: Ability to maintain clinical measurements within normal limits will improve Outcome: Progressing Goal: Will remain free from infection Outcome: Progressing Goal: Diagnostic test results will improve Outcome: Progressing Goal: Respiratory complications will improve Outcome: Progressing Goal: Cardiovascular complication will be avoided Outcome: Progressing   Problem: Activity: Goal: Risk for activity intolerance will decrease Outcome: Progressing   Problem: Coping: Goal: Level of anxiety will decrease Outcome: Progressing   Problem: Elimination: Goal: Will not experience complications related to bowel motility Outcome: Progressing Goal: Will not experience complications related to urinary retention Outcome: Progressing   Problem: Skin Integrity: Goal: Risk for impaired skin integrity will decrease Outcome: Progressing

## 2018-05-31 NOTE — Progress Notes (Signed)
Inpatient Diabetes Program Recommendations  AACE/ADA: New Consensus Statement on Inpatient Glycemic Control (2019)  Target Ranges:  Prepandial:   less than 140 mg/dL      Peak postprandial:   less than 180 mg/dL (1-2 hours)      Critically ill patients:  140 - 180 mg/dL   Results for Audelia ActonMCALLISTER, Festus D (MRN [id number]) as of 05/31/2018 10:05  Ref. Range 05/30/2018 07:28 05/30/2018 09:40 05/30/2018 11:19 05/30/2018 14:46 05/30/2018 18:36 05/30/2018 21:27 05/31/2018 06:20  Glucose-Capillary Latest Ref Range: 70 - 99 mg/dL 409258 (H) 811267 (H) 914245 (H) 146 (H) 118 (H) 110 (H) 270 (H)  Results for Audelia ActonMCALLISTER, Josias D (MRN [id number]) as of 05/31/2018 10:05  Ref. Range 05/30/2018 03:36  Hemoglobin A1C Latest Ref Range: 4.8 - 5.6 % 11.7 (H)   Review of Glycemic Control  Current orders for Inpatient glycemic control: Levemir 15 QHS, Novolog 0-15 units TID with meals  Inpatient Diabetes Program Recommendations:  Insulin - Basal: Please consider increasing Levemir to 20 units QHS. Insulin - Meal Coverage: Please consider ordering Novolog 3 units TID with meals for meal coverage if patient eats at least 50% of meals.  Thanks, Orlando PennerMarie Dafna Romo, RN, MSN, CDE Diabetes Coordinator Inpatient Diabetes Program [phone] (Team Pager from 8am to 5pm)

## 2018-06-01 ENCOUNTER — Encounter (HOSPITAL_COMMUNITY)
Admission: RE | Disposition: A | Discharge status: Home / Self Care | Payer: Self-pay | Source: Home / Self Care | Attending: Orthopedic Surgery

## 2018-06-01 ENCOUNTER — Inpatient Hospital Stay (HOSPITAL_COMMUNITY): Payer: Medicaid Other | Admitting: Certified Registered"

## 2018-06-01 ENCOUNTER — Encounter (HOSPITAL_COMMUNITY): Payer: Self-pay | Admitting: Certified Registered"

## 2018-06-01 DIAGNOSIS — S91002A Unspecified open wound, left ankle, initial encounter: Secondary | ICD-10-CM

## 2018-06-01 DIAGNOSIS — S91002D Unspecified open wound, left ankle, subsequent encounter: Secondary | ICD-10-CM

## 2018-06-01 HISTORY — PX: I & D EXTREMITY: SHX5045

## 2018-06-01 LAB — GLUCOSE, CAPILLARY
Glucose-Capillary: 261 mg/dL — ABNORMAL HIGH (ref 70–99)
Glucose-Capillary: 142 mg/dL — ABNORMAL HIGH (ref 70–99)
Glucose-Capillary: 149 mg/dL — ABNORMAL HIGH (ref 70–99)
Glucose-Capillary: 244 mg/dL — ABNORMAL HIGH (ref 70–99)
Glucose-Capillary: 267 mg/dL — ABNORMAL HIGH (ref 70–99)

## 2018-06-01 LAB — BASIC METABOLIC PANEL
Anion gap: 9 (ref 5–15)
BUN: 12 mg/dL (ref 6–20)
CO2: 27 mmol/L (ref 22–32)
Calcium: 8.7 mg/dL — ABNORMAL LOW (ref 8.9–10.3)
Chloride: 101 mmol/L (ref 98–111)
Creatinine, Ser: 1.22 mg/dL (ref 0.61–1.24)
GFR calc Af Amer: >60 mL/min (ref >60)
GFR calc non Af Amer: >60 mL/min (ref >60)
Glucose, Bld: 267 mg/dL — ABNORMAL HIGH (ref 70–99)
Potassium: 4.3 mmol/L (ref 3.5–5.1)
Sodium: 137 mmol/L (ref 135–145)

## 2018-06-01 SURGERY — IRRIGATION AND DEBRIDEMENT EXTREMITY
Anesthesia: General | Laterality: Left

## 2018-06-01 MED ORDER — MEPERIDINE HCL 50 MG/ML IJ SOLN: 6.2500 mg | INTRAMUSCULAR | Status: DC | PRN

## 2018-06-01 MED ORDER — POLYETHYLENE GLYCOL 3350 17 G PO PACK: 17.0000 g | PACK | Freq: Every day | ORAL | Status: DC | PRN

## 2018-06-01 MED ORDER — PROPOFOL 10 MG/ML IV BOLUS
INTRAVENOUS | Status: AC
  Filled 2018-06-01: qty 20

## 2018-06-01 MED ORDER — PROPOFOL 10 MG/ML IV BOLUS
INTRAVENOUS | Status: DC | PRN
  Administered 2018-06-01: 160 mg via INTRAVENOUS

## 2018-06-01 MED ORDER — LACTATED RINGERS IV SOLN: INTRAVENOUS | Status: DC

## 2018-06-01 MED ORDER — METHOCARBAMOL 1000 MG/10ML IJ SOLN: 500.0000 mg | Freq: Four times a day (QID) | INTRAVENOUS | Status: DC | PRN

## 2018-06-01 MED ORDER — MAGNESIUM CITRATE PO SOLN: 1.0000 | Freq: Once | ORAL | Status: DC | PRN

## 2018-06-01 MED ORDER — OXYCODONE HCL 5 MG PO TABS
10.0000 mg | ORAL_TABLET | ORAL | Status: DC | PRN
  Administered 2018-06-01 – 2018-06-02 (×2): 15 mg via ORAL
  Filled 2018-06-01 (×2): qty 3

## 2018-06-01 MED ORDER — ONDANSETRON HCL 4 MG/2ML IJ SOLN
INTRAMUSCULAR | Status: DC | PRN
  Administered 2018-06-01: 4 mg via INTRAVENOUS

## 2018-06-01 MED ORDER — BISACODYL 10 MG RE SUPP: 10.0000 mg | Freq: Every day | RECTAL | Status: DC | PRN

## 2018-06-01 MED ORDER — ACETAMINOPHEN 325 MG PO TABS: 325.0000 mg | ORAL_TABLET | Freq: Four times a day (QID) | ORAL | Status: DC | PRN

## 2018-06-01 MED ORDER — HYDROMORPHONE HCL 1 MG/ML IJ SOLN
0.5000 mg | INTRAMUSCULAR | Status: DC | PRN
  Administered 2018-06-02: 1 mg via INTRAVENOUS
  Filled 2018-06-01: qty 1

## 2018-06-01 MED ORDER — CEFAZOLIN SODIUM-DEXTROSE 2-4 GM/100ML-% IV SOLN: 2.0000 g | INTRAVENOUS | Status: DC

## 2018-06-01 MED ORDER — METOCLOPRAMIDE HCL 5 MG PO TABS: 5.0000 mg | ORAL_TABLET | Freq: Three times a day (TID) | ORAL | Status: DC | PRN

## 2018-06-01 MED ORDER — ONDANSETRON HCL 4 MG/2ML IJ SOLN: 4.0000 mg | Freq: Four times a day (QID) | INTRAMUSCULAR | Status: DC | PRN

## 2018-06-01 MED ORDER — CEFAZOLIN SODIUM-DEXTROSE 2-4 GM/100ML-% IV SOLN
INTRAVENOUS | Status: AC
  Filled 2018-06-01: qty 100

## 2018-06-01 MED ORDER — DOCUSATE SODIUM 100 MG PO CAPS: 100.0000 mg | ORAL_CAPSULE | Freq: Two times a day (BID) | ORAL | Status: DC

## 2018-06-01 MED ORDER — LIDOCAINE 2% (20 MG/ML) 5 ML SYRINGE
INTRAMUSCULAR | Status: DC | PRN
  Administered 2018-06-01: 80 mg via INTRAVENOUS

## 2018-06-01 MED ORDER — SODIUM CHLORIDE 0.9 % IR SOLN
Status: DC | PRN
  Administered 2018-06-01: 3000 mL

## 2018-06-01 MED ORDER — PHENYLEPHRINE 40 MCG/ML (10ML) SYRINGE FOR IV PUSH (FOR BLOOD PRESSURE SUPPORT)
PREFILLED_SYRINGE | INTRAVENOUS | Status: AC
  Filled 2018-06-01: qty 10

## 2018-06-01 MED ORDER — FENTANYL CITRATE (PF) 100 MCG/2ML IJ SOLN
INTRAMUSCULAR | Status: AC
  Filled 2018-06-01: qty 2

## 2018-06-01 MED ORDER — FENTANYL CITRATE (PF) 100 MCG/2ML IJ SOLN
25.0000 ug | INTRAMUSCULAR | Status: DC | PRN
  Administered 2018-06-01: 50 ug via INTRAVENOUS

## 2018-06-01 MED ORDER — LIDOCAINE 2% (20 MG/ML) 5 ML SYRINGE
INTRAMUSCULAR | Status: AC
  Filled 2018-06-01: qty 5

## 2018-06-01 MED ORDER — LACTATED RINGERS IV SOLN
INTRAVENOUS | Status: DC
  Administered 2018-06-01: 09:00:00 via INTRAVENOUS

## 2018-06-01 MED ORDER — MIDAZOLAM HCL 2 MG/2ML IJ SOLN
INTRAMUSCULAR | Status: AC
  Filled 2018-06-01: qty 2

## 2018-06-01 MED ORDER — METHOCARBAMOL 500 MG PO TABS: 500.0000 mg | ORAL_TABLET | Freq: Four times a day (QID) | ORAL | Status: DC | PRN

## 2018-06-01 MED ORDER — FENTANYL CITRATE (PF) 250 MCG/5ML IJ SOLN
INTRAMUSCULAR | Status: DC | PRN
  Administered 2018-06-01: 50 ug via INTRAVENOUS

## 2018-06-01 MED ORDER — 0.9 % SODIUM CHLORIDE (POUR BTL) OPTIME
TOPICAL | Status: DC | PRN
  Administered 2018-06-01 (×2): 1000 mL

## 2018-06-01 MED ORDER — ONDANSETRON HCL 4 MG/2ML IJ SOLN
INTRAMUSCULAR | Status: AC
  Filled 2018-06-01: qty 2

## 2018-06-01 MED ORDER — SODIUM CHLORIDE 0.9 % IV SOLN
INTRAVENOUS | Status: DC
  Administered 2018-06-01: 13:00:00 via INTRAVENOUS

## 2018-06-01 MED ORDER — METOCLOPRAMIDE HCL 5 MG/ML IJ SOLN: 10.0000 mg | Freq: Once | INTRAMUSCULAR | Status: DC | PRN

## 2018-06-01 MED ORDER — OXYCODONE HCL 5 MG PO TABS
5.0000 mg | ORAL_TABLET | ORAL | Status: DC | PRN
  Administered 2018-06-03 – 2018-06-04 (×2): 10 mg via ORAL
  Administered 2018-06-05: 5 mg via ORAL
  Filled 2018-06-01: qty 1
  Filled 2018-06-01 (×2): qty 2

## 2018-06-01 MED ORDER — CHLORHEXIDINE GLUCONATE 4 % EX LIQD: 60.0000 mL | Freq: Once | CUTANEOUS | Status: DC

## 2018-06-01 MED ORDER — FENTANYL CITRATE (PF) 250 MCG/5ML IJ SOLN
INTRAMUSCULAR | Status: AC
  Filled 2018-06-01: qty 5

## 2018-06-01 MED ORDER — ONDANSETRON HCL 4 MG PO TABS: 4.0000 mg | ORAL_TABLET | Freq: Four times a day (QID) | ORAL | Status: DC | PRN

## 2018-06-01 MED ORDER — MIDAZOLAM HCL 5 MG/5ML IJ SOLN
INTRAMUSCULAR | Status: DC | PRN
  Administered 2018-06-01: 2 mg via INTRAVENOUS

## 2018-06-01 MED ORDER — METOCLOPRAMIDE HCL 5 MG/ML IJ SOLN: 5.0000 mg | Freq: Three times a day (TID) | INTRAMUSCULAR | Status: DC | PRN

## 2018-06-01 SURGICAL SUPPLY — 43 items
ALLOGRAFT SKIN MESHD 125 SQ CM (Tissue) ×1 IMPLANT
BLADE SURG 21 STRL SS (BLADE) ×3 IMPLANT
BNDG COHESIVE 6X5 TAN STRL LF (GAUZE/BANDAGES/DRESSINGS) IMPLANT
BNDG GAUZE ELAST 4 BULKY (GAUZE/BANDAGES/DRESSINGS) ×2 IMPLANT
COVER SURGICAL LIGHT HANDLE (MISCELLANEOUS) ×4 IMPLANT
COVER WAND RF STERILE (DRAPES) ×1 IMPLANT
DRAPE INCISE IOBAN 66X45 STRL (DRAPES) ×2 IMPLANT
DRAPE U-SHAPE 47X51 STRL (DRAPES) ×3 IMPLANT
DRESSING VERAFLO CLEANSE CC (GAUZE/BANDAGES/DRESSINGS) IMPLANT
DRSG ADAPTIC 3X8 NADH LF (GAUZE/BANDAGES/DRESSINGS) ×1 IMPLANT
DRSG VERAFLO CLEANSE CC (GAUZE/BANDAGES/DRESSINGS) ×3
DURAPREP 26ML APPLICATOR (WOUND CARE) ×3 IMPLANT
ELECT REM PT RETURN 9FT ADLT (ELECTROSURGICAL) ×3
ELECTRODE REM PT RTRN 9FT ADLT (ELECTROSURGICAL) IMPLANT
GAUZE SPONGE 4X4 12PLY STRL (GAUZE/BANDAGES/DRESSINGS) ×1 IMPLANT
GLOVE BIOGEL PI IND STRL 9 (GLOVE) ×1 IMPLANT
GLOVE BIOGEL PI INDICATOR 9 (GLOVE) ×2
GLOVE SURG ORTHO 9.0 STRL STRW (GLOVE) ×3 IMPLANT
GOWN STRL REUS W/ TWL XL LVL3 (GOWN DISPOSABLE) ×2 IMPLANT
GOWN STRL REUS W/TWL XL LVL3 (GOWN DISPOSABLE) ×4
GRAFT TISS MESH 125 BURN (Tissue) IMPLANT
HANDPIECE INTERPULSE COAX TIP (DISPOSABLE) ×2
KIT BASIN OR (CUSTOM PROCEDURE TRAY) ×3 IMPLANT
KIT TURNOVER KIT B (KITS) ×3 IMPLANT
MANIFOLD NEPTUNE II (INSTRUMENTS) ×3 IMPLANT
MICROMATRIX 500MG (Tissue) ×3 IMPLANT
NS IRRIG 1000ML POUR BTL (IV SOLUTION) ×3 IMPLANT
PACK ORTHO EXTREMITY (CUSTOM PROCEDURE TRAY) ×3 IMPLANT
PAD ARMBOARD 7.5X6 YLW CONV (MISCELLANEOUS) ×4 IMPLANT
PAD NEG PRESSURE SENSATRAC (MISCELLANEOUS) ×2 IMPLANT
SET HNDPC FAN SPRY TIP SCT (DISPOSABLE) IMPLANT
SKIN MESHED 125 SQ CM (Tissue) ×3 IMPLANT
SOLUTION PARTIC MCRMTRX 500MG (Tissue) IMPLANT
STAPLER VISISTAT 35W (STAPLE) ×2 IMPLANT
STOCKINETTE IMPERVIOUS 9X36 MD (GAUZE/BANDAGES/DRESSINGS) IMPLANT
SUT ETHILON 2 0 PSLX (SUTURE) ×4 IMPLANT
SWAB COLLECTION DEVICE MRSA (MISCELLANEOUS) ×1 IMPLANT
SWAB CULTURE ESWAB REG 1ML (MISCELLANEOUS) IMPLANT
TOWEL OR 17X26 10 PK STRL BLUE (TOWEL DISPOSABLE) ×3 IMPLANT
TUBE CONNECTING 12'X1/4 (SUCTIONS) ×1
TUBE CONNECTING 12X1/4 (SUCTIONS) ×2 IMPLANT
WND VAC CANISTER 500ML (MISCELLANEOUS) ×2 IMPLANT
YANKAUER SUCT BULB TIP NO VENT (SUCTIONS) ×3 IMPLANT

## 2018-06-01 NOTE — Plan of Care (Signed)
Problem: Education: Goal: Knowledge of General Education information will improve Description Including pain rating scale, medication(s)/side effects and non-pharmacologic comfort measures Outcome: Progressing   Problem: Clinical Measurements: Goal: Will remain free from infection Outcome: Progressing   Problem: Activity: Goal: Risk for activity intolerance will decrease Outcome: Progressing   Problem: Skin Integrity: Goal: Risk for impaired skin integrity will decrease Outcome: Progressing   

## 2018-06-01 NOTE — Progress Notes (Signed)
Report given to Sam, RN in Short Stay.

## 2018-06-01 NOTE — Progress Notes (Signed)
Occupational Therapy Treatment Patient Details Name: Cesar ActonJohn D Lapenna MRN: [id number] DOB: [birth year] Today's Date: 06/01/2018    History of present illness Pt is a 52 y.o. male with CKD 3, hx of CVA, IDDM, who sustained mechanical fall on 10/14 and developed left  trimalleolar ankle fracture. He underwent ORIF on 10/22. His ankle started to have drainage noted on 11/13 Dr Dion SaucierLandau took him to the OR on 10/14 for left ankle IX D, removal of HW- all HW removed, now NWB LLE.   OT comments  Pt progressing towards OT goals. Session focus was on tub transfer practice in ortho gym. Pt was educated on transfer with 3 in 1 and tub bench. Pt was MUCH MORE SUCCESSFUL WITH TUB BENCH due to NWB status. Pt performed multiple sit <>Stand transfers at min A for balance and safety (boost with ascent and assist to slow on descent). Pt continues to benefit from skilled OT in the acute setting. Next session to practice independence in donning boot and LB dressing compensatory strategies  Follow Up Recommendations  Home health OT    Equipment Recommendations  Tub/shower bench    Recommendations for Other Services      Precautions / Restrictions Precautions Precautions: Fall Restrictions Weight Bearing Restrictions: Yes LLE Weight Bearing: Non weight bearing Other Position/Activity Restrictions: VAC       Mobility Bed Mobility                  Transfers Overall transfer level: Needs assistance Equipment used: Rolling walker (2 wheeled) Transfers: Sit to/from Stand Sit to Stand: Min assist         General transfer comment: min guard assist for balance and safety- verbal cues for WB trhoughout session; Min assist to rise from low commode    Balance Overall balance assessment: Needs assistance Sitting-balance support: No upper extremity supported Sitting balance-Leahy Scale: Good       Standing balance-Leahy Scale: Poor Standing balance comment: dependent on BUE support; but able to  maintain NWB this session with encouragement and cues                           ADL either performed or assessed with clinical judgement   ADL Overall ADL's : Needs assistance/impaired                         Toilet Transfer: Minimal assistance;Ambulation;RW Toilet Transfer Details (indicate cue type and reason): cues for safe descent/hand placement assist for boost     Tub/ Shower Transfer: Minimal assistance;Anterior/posterior;Tub transfer;Tub bench;Rolling walker Tub/Shower Transfer Details (indicate cue type and reason): performed in gym, also educated on 3 in 1 - but for safety and function TUB BENCH is the BEST option Functional mobility during ADLs: Minimal assistance;Cueing for sequencing;Rolling walker       Vision       Perception     Praxis      Cognition Arousal/Alertness: Awake/alert Behavior During Therapy: WFL for tasks assessed/performed Overall Cognitive Status: No family/caregiver present to determine baseline cognitive functioning Area of Impairment: Safety/judgement                         Safety/Judgement: Decreased awareness of safety;Decreased awareness of deficits     General Comments: question Pt's ability to maintain WB status in home environment        Exercises     Shoulder Instructions  General Comments      Pertinent Vitals/ Pain       Pain Assessment: Faces Faces Pain Scale: Hurts a little bit Pain Location: LLE Pain Descriptors / Indicators: Aching;Dull;Grimacing Pain Intervention(s): Limited activity within patient's tolerance;Repositioned  Home Living                                          Prior Functioning/Environment              Frequency  Min 3X/week        Progress Toward Goals  OT Goals(current goals can now be found in the care plan section)  Progress towards OT goals: Progressing toward goals  Acute Rehab OT Goals Patient Stated Goal: stated he  wants to be able to manage stairs better OT Goal Formulation: With patient Time For Goal Achievement: 06/11/18 Potential to Achieve Goals: Good  Plan Discharge plan remains appropriate;Frequency remains appropriate    Co-evaluation                 AM-PAC OT "6 Clicks" Daily Activity     Outcome Measure   Help from another person eating meals?: None Help from another person taking care of personal grooming?: None(in seated position) Help from another person toileting, which includes using toliet, bedpan, or urinal?: A Little Help from another person bathing (including washing, rinsing, drying)?: A Little Help from another person to put on and taking off regular upper body clothing?: None Help from another person to put on and taking off regular lower body clothing?: A Little 6 Click Score: 21    End of Session Equipment Utilized During Treatment: Gait belt;Rolling walker  OT Visit Diagnosis: Unsteadiness on feet (R26.81);Other abnormalities of gait and mobility (R26.89);History of falling (Z91.81);Pain Pain - Right/Left: Left Pain - part of body: Ankle and joints of foot   Activity Tolerance Patient tolerated treatment well   Patient Left in chair;with call bell/phone within reach   Nurse Communication Mobility status;Precautions;Weight bearing status        Time: 4098-11911441-1504 OT Time Calculation (min): 23 min  Charges: OT General Charges $OT Visit: 1 Visit OT Treatments $Self Care/Home Management : 23-37 mins  Sherryl MangesLaura Bonnye Halle OTR/L Acute Rehabilitation Services Pager: [phone] Office: [phone]   Evern BioLaura J Tilly Pernice 06/01/2018, 3:24 PM

## 2018-06-01 NOTE — Transfer of Care (Signed)
Immediate Anesthesia Transfer of Care Note  Patient: Audelia ActonJohn D Clardy  Procedure(s) Performed: REPEAT IRRIGATION AND DEBRIDEMENT Mickeal NeedyANKLE,WOUND VAC AND SKIN GRAFT (Left )  Patient Location: PACU  Anesthesia Type:General  Level of Consciousness: awake, alert , oriented and patient cooperative  Airway & Oxygen Therapy: Patient Spontanous Breathing and Patient connected to nasal cannula oxygen  Post-op Assessment: Report given to RN, Post -op Vital signs reviewed and stable and Patient moving all extremities  Post vital signs: Reviewed and stable  Last Vitals:  Vitals Value Taken Time  BP 171/96 06/01/2018 10:05 AM  Temp 36.6 C 06/01/2018 10:05 AM  Pulse 85 06/01/2018 10:06 AM  Resp 12 06/01/2018 10:06 AM  SpO2 100 % 06/01/2018 10:06 AM  Vitals shown include unvalidated device data.  Last Pain:  Vitals:   06/01/18 1005  TempSrc:   PainSc: Asleep      Patients Stated Pain Goal: 2 (05/31/18 1830)  Complications: No apparent anesthesia complications

## 2018-06-01 NOTE — Progress Notes (Signed)
Physical Therapy Treatment Patient Details Name: Cesar ActonJohn D Ali MRN: [id number] DOB: [birth year] Today's Date: 06/01/2018    History of Present Illness Pt is a 52 y.o. male with CKD 3, hx of CVA, IDDM, who sustained mechanical fall on 10/14 and developed left  trimalleolar ankle fracture. He underwent ORIF on 10/22. His ankle started to have drainage noted on 11/13 Dr Dion SaucierLandau took him to the OR on 10/14 for left ankle IX D, removal of HW- all HW removed, now NWB LLE.    PT Comments    Pt performed gait training and stair negotiation, dovetailed OT session for patient's ease.  Pt requiring min to mod assistance.  Plan next session for retrial of stair training.     Follow Up Recommendations  Home health PT;Supervision/Assistance - 24 hour     Equipment Recommendations  Wheelchair (measurements PT);Wheelchair cushion (measurements PT);Other (comment)    Recommendations for Other Services       Precautions / Restrictions Precautions Precautions: Fall Required Braces or Orthoses: Other Brace Other Brace: CAM walker on at all times Restrictions Weight Bearing Restrictions: Yes LLE Weight Bearing: Non weight bearing Other Position/Activity Restrictions: VAC    Mobility  Bed Mobility               General bed mobility comments: Pt seated in recliner on arrival.    Transfers Overall transfer level: Needs assistance Equipment used: Rolling walker (2 wheeled) Transfers: Sit to/from Stand Sit to Stand: Min assist         General transfer comment: min assistance to boost into standing.    Ambulation/Gait Ambulation/Gait assistance: Min guard Gait Distance (Feet): 10 Feet(+ 25 ft) Assistive device: Rolling walker (2 wheeled) Gait Pattern/deviations: Step-to pattern;Trunk flexed(hop to pattern) Gait velocity: slowed   General Gait Details: Hop to step with flexed posture. Cues for upper trunk control.     Stairs Stairs: Yes Stairs assistance: Mod assist Stair  Management: No rails;With walker Number of Stairs: 4 General stair comments: Cues for sequencing and RW placement, he will required significant assistance to manage RW and keep RW in placed during hop to pattern to ascend/descend stairs.     Wheelchair Mobility    Modified Rankin (Stroke Patients Only)       Balance Overall balance assessment: Needs assistance Sitting-balance support: No upper extremity supported Sitting balance-Leahy Scale: Good       Standing balance-Leahy Scale: Poor Standing balance comment: dependent on BUE support; but able to maintain NWB this session with encouragement and cues                            Cognition Arousal/Alertness: Awake/alert Behavior During Therapy: WFL for tasks assessed/performed Overall Cognitive Status: No family/caregiver present to determine baseline cognitive functioning Area of Impairment: Safety/judgement                         Safety/Judgement: Decreased awareness of safety;Decreased awareness of deficits     General Comments: question Pt's ability to maintain WB status in home environment      Exercises      General Comments        Pertinent Vitals/Pain Pain Assessment: No/denies pain Pain Score: 4  Faces Pain Scale: Hurts a little bit Pain Location: LLE Pain Descriptors / Indicators: Aching;Dull;Grimacing Pain Intervention(s): Monitored during session;Repositioned    Home Living  Prior Function            PT Goals (current goals can now be found in the care plan section) Acute Rehab PT Goals Patient Stated Goal: stated he wants to be able to manage stairs better Potential to Achieve Goals: Fair Additional Goals Additional Goal #1: Pt will independently manage with wheelchair, including parts management and propulsion (600 feet) and turns Progress towards PT goals: Progressing toward goals    Frequency    Min 6X/week      PT Plan Current  plan remains appropriate    Co-evaluation              AM-PAC PT "6 Clicks" Mobility   Outcome Measure  Help needed turning from your back to your side while in a flat bed without using bedrails?: None Help needed moving from lying on your back to sitting on the side of a flat bed without using bedrails?: None Help needed moving to and from a bed to a chair (including a wheelchair)?: A Little Help needed standing up from a chair using your arms (e.g., wheelchair or bedside chair)?: A Little Help needed to walk in hospital room?: A Little Help needed climbing 3-5 steps with a railing? : A Lot 6 Click Score: 19    End of Session Equipment Utilized During Treatment: Gait belt Activity Tolerance: Patient tolerated treatment well Patient left: in chair;with call bell/phone within reach Nurse Communication: Mobility status PT Visit Diagnosis: Unsteadiness on feet (R26.81);Other abnormalities of gait and mobility (R26.89);Difficulty in walking, not elsewhere classified (R26.2)     Time: [id number] PT Time Calculation (min) (ACUTE ONLY): 18 min  Charges:  $Gait Training: 8-22 mins                     Joycelyn RuaAimee Leiann Sporer, PTA Acute Rehabilitation Services Pager [phone] Office [phone]     Cesar Ali Cesar Ali 06/01/2018, 3:51 PM

## 2018-06-01 NOTE — Op Note (Signed)
06/01/2018  10:09 AM  PATIENT:  Cesar Ali    PRE-OPERATIVE DIAGNOSIS:  left ankle wound, exposed fibula with acute osteomyelitis and ankle fracture.  POST-OPERATIVE DIAGNOSIS:  Same  PROCEDURE:  REPEAT IRRIGATION AND DEBRIDEMENT ANKLE, With excision of partial fibula skin and soft tissue and muscle using osteotome rondure and curette. Application of ACell powder 500 mg with 125 cm split-thickness skin graft allograft Local tissue rearrangement for wound closure 9 x 4 cm. Application cleanse choice wound VAC.  SURGEON:  Nadara MustardMarcus V Arma Reining, MD  PHYSICIAN ASSISTANT:None ANESTHESIA:   General  PREOPERATIVE INDICATIONS:  Cesar Ali is a  52 y.o. male with a diagnosis of left ankle wound who failed conservative measures and elected for surgical management.    The risks benefits and alternatives were discussed with the patient preoperatively including but not limited to the risks of infection, bleeding, nerve injury, cardiopulmonary complications, the need for revision surgery, among others, and the patient was willing to proceed.  OPERATIVE IMPLANTS: ACell powder 500 mg with split-thickness skin graft allograft 125 cm  @ENCIMAGES @  OPERATIVE FINDINGS: Fibula with bleeding bone after debridement wound edges healthy and viable after debridement.  OPERATIVE PROCEDURE: Patient was brought the operating room and underwent a general anesthetic.  After adequate levels anesthesia obtained patient's left lower extremity was prepped first prepped using ChloraPrep scrub   DISCHARGE PLANNING:  Antibiotic duration: Cleanse the lower extremity this was then dried and the left lower extremity was then prepped using DuraPrep draped into a sterile field a timeout was called.  The sutures were removed the sponges were removed.  Patient had exposed nonviable fibula with fragments without soft tissue attachment.  The nonviable fragments were removed and a osteotome rondure and curette were used  to partially debride the lateral aspect of the fibula back to bleeding viable subchondral bone.  The wound was irrigated with pulsatile lavage.  Rondure knife were also used to further debride soft tissue around the ankle.  The wound was irrigated with 3 L normal saline.  There was good bleeding edges along the surgical incision which was now 4 x 9 cm.  The incision was asked Dendrid proximally and distally.  The soft tissue was undermined adjacent to the bone to allow for local tissue rearrangement.  The ACell powder was applied the 125 cm split-thickness skin graft was applied and stapled in place.  Local tissue rearrangement was used to then close the incision that was 9 x 4 cm.  This was then covered with the cleanse choice VAC with the reticulated foam followed by the solid foam Ioban was applied this had a good suction fit patient was extubated taken the PACU in stable condition.  Weightbearing: Nonweightbearing on the left lower extremity patient is to wear the fracture boot at all times  Pain medication: High-dose opioid pathway ordered  Dressing care/ Wound VAC: Continue wound VAC at discharge I will remove the wound VAC at follow-up in the office.  Ambulatory devices: Walker or crutches.  Discharge to: Anticipate discharge to home I will follow-up in the office in 1 week.  Follow-up: In the office 1 week post operative.

## 2018-06-01 NOTE — Progress Notes (Signed)
Inpatient Diabetes Program Recommendations  AACE/ADA: New Consensus Statement on Inpatient Glycemic Control (2015)  Target Ranges:  Prepandial:   less than 140 mg/dL      Peak postprandial:   less than 180 mg/dL (1-2 hours)      Critically ill patients:  140 - 180 mg/dL   Results for Cesar Ali, Cesar Ali (MRN [id number]) as of 06/01/2018 10:38  Ref. Range 05/31/2018 06:20 05/31/2018 11:17 05/31/2018 16:23 05/31/2018 21:36  Glucose-Capillary Latest Ref Range: 70 - 99 mg/dL 409270 (H)  8 units NOVOLOG  236 (H)  5 units NOVOLOG  269 (H)  8 units NOVOLOG  174 (H)    15 units LEVEMIR   Results for Cesar Ali, Cesar Ali (MRN [id number]) as of 06/01/2018 10:38  Ref. Range 06/01/2018 06:23 06/01/2018 10:05  Glucose-Capillary Latest Ref Range: 70 - 99 mg/dL 956261 (H)  8 units NOVOLOG  142 (H)     Home DM Meds: Levemir 25 units BID       Regular Insulin 2-8 units BID per SSI  Current Orders: Levemir 15 units QHS      Novolog Moderate Correction Scale/ SSI (0-15 units) TID AC       Note patient NPO for surgery today.  AM CBGs have been elevated the last 2 days.  Takes significantly more Levemir Insulin at home.    MD- Please consider the following in-hospital insulin adjustments:  1. Increase Levemir to 25 units QHS (50% total home dose)  2. Start Novolog Meal Coverage once patient resumes PO diet today:  Novolog 4 units TID with meals  (Please add the following Hold Parameters: Hold if pt eats <50% of meal, Hold if pt NPO)      --Will follow patient during hospitalization--  Ambrose FinlandJeannine Johnston Maxxon Schwanke RN, MSN, CDE Diabetes Coordinator Inpatient Glycemic Control Team Team Pager: [phone] (8a-5p)

## 2018-06-01 NOTE — Progress Notes (Signed)
PHARMACY CONSULT NOTE FOR:  OUTPATIENT  PARENTERAL ANTIBIOTIC THERAPY (OPAT)  Indication: Osteomyelitis Regimen: Ceftaroline 600 mg every 12 hours End date: 07/11/18  IV antibiotic discharge orders are pended. To discharging provider:  please sign these orders via discharge navigator,  Select New Orders & click on the button choice - Manage This Unsigned Work.     Thank you for allowing pharmacy to be a part of this patient's care.  Sharin MonsEmily Athen Riel, PharmD, BCPS, BCIDP Infectious Diseases Clinical Pharmacist Phone: [phone] 06/01/2018, 8:10 AM

## 2018-06-01 NOTE — Anesthesia Postprocedure Evaluation (Signed)
Anesthesia Post Note  Patient: Cesar Ali  Procedure(s) Performed: REPEAT IRRIGATION AND DEBRIDEMENT Mickeal NeedyANKLE,WOUND VAC AND SKIN GRAFT (Left )     Patient location during evaluation: PACU Anesthesia Type: General Level of consciousness: awake and alert Pain management: pain level controlled Vital Signs Assessment: post-procedure vital signs reviewed and stable Respiratory status: spontaneous breathing, nonlabored ventilation, respiratory function stable and patient connected to nasal cannula oxygen Cardiovascular status: blood pressure returned to baseline and stable Postop Assessment: no apparent nausea or vomiting Anesthetic complications: no    Last Vitals:  Vitals:   06/01/18 1040 06/01/18 1101  BP: (!) 168/99 (!) 173/99  Pulse: 82 83  Resp: (!) 9 16  Temp: 36.6 C 36.5 C  SpO2: 100% 100%    Last Pain:  Vitals:   06/01/18 1406  TempSrc:   PainSc: Asleep                 Phillips Groutarignan, Nitin Mckowen

## 2018-06-01 NOTE — Anesthesia Preprocedure Evaluation (Signed)
Anesthesia Evaluation  Patient identified by MRN, date of birth, ID band Patient awake    Reviewed: Allergy & Precautions, NPO status , Patient's Chart, lab work & pertinent test results  Airway Mallampati: II  TM Distance: >3 FB Neck ROM: Full    Dental no notable dental hx. (+) Edentulous Upper, Edentulous Lower   Pulmonary neg pulmonary ROS, former smoker,    Pulmonary exam normal breath sounds clear to auscultation       Cardiovascular hypertension, Pt. on medications Normal cardiovascular exam Rhythm:Regular Rate:Normal     Neuro/Psych CVA, Residual Symptoms negative psych ROS   GI/Hepatic negative GI ROS, Neg liver ROS,   Endo/Other  diabetes, Type 1  Renal/GU Renal InsufficiencyRenal disease  negative genitourinary   Musculoskeletal negative musculoskeletal ROS (+)   Abdominal   Peds negative pediatric ROS (+)  Hematology negative hematology ROS (+)   Anesthesia Other Findings   Reproductive/Obstetrics negative OB ROS                             Anesthesia Physical Anesthesia Plan  ASA: III  Anesthesia Plan: General   Post-op Pain Management:    Induction: Intravenous  PONV Risk Score and Plan: 2 and Ondansetron and Treatment may vary due to age or medical condition  Airway Management Planned: LMA  Additional Equipment:   Intra-op Plan:   Post-operative Plan: Extubation in OR  Informed Consent: I have reviewed the patients History and Physical, chart, labs and discussed the procedure including the risks, benefits and alternatives for the proposed anesthesia with the patient or authorized representative who has indicated his/her understanding and acceptance.   Dental advisory given  Plan Discussed with: CRNA  Anesthesia Plan Comments:         Anesthesia Quick Evaluation

## 2018-06-01 NOTE — Progress Notes (Deleted)
The patient is being difficult this morning- numerous complaints and rambling conversation.  The patient reports that "his job is not to have any responsibility for his care ie colostomy care and emptying colostomy bag.  The patient's colostomy bag was leaking around the site and multiple supplies were brought to his room and the nurse changed entire system.  The patient complained and said the bag would be leaking in 1 hour,  The patient has been checked frequently and the Assist. Director was brought into the room to witness the patient's verbal abuse being presented to the staff.

## 2018-06-01 NOTE — Anesthesia Procedure Notes (Signed)
Procedure Name: LMA Insertion Date/Time: 06/01/2018 9:18 AM Performed by: Lucinda Dellecarlo, Daisean Brodhead M, CRNA Pre-anesthesia Checklist: Emergency Drugs available, Patient identified, Suction available and Patient being monitored Patient Re-evaluated:Patient Re-evaluated prior to induction Oxygen Delivery Method: Circle system utilized Preoxygenation: Pre-oxygenation with 100% oxygen Induction Type: IV induction Ventilation: Mask ventilation without difficulty LMA: LMA inserted LMA Size: 5.0

## 2018-06-01 NOTE — Progress Notes (Signed)
Orthopedic Tech Progress Note Patient Details:  Audelia ActonJohn D Bingley Dec 21, 1965 [id number]  Ortho Devices Type of Ortho Device: CAM walker Ortho Device/Splint Interventions: Application   Post Interventions Patient Tolerated: Well Instructions Provided: Care of device   Saul FordyceJennifer C Kierre Hintz 06/01/2018, 12:19 PM

## 2018-06-01 NOTE — Progress Notes (Signed)
PT Cancellation Note  Patient Details Name: Audelia ActonJohn D Bohall MRN: [id number] DOB: Jun 27, 1965   Cancelled Treatment:    Reason Eval/Treat Not Completed: Patient at procedure or test/unavailable(Pt in I &D this am.  Will check back and proceed as able. )   Berline Lopesawn F Umberto Pavek 06/01/2018, 8:53 AM Eber Jonesawn Brigido Mera,PT Acute Rehabilitation Services Pager:  [phone]  Office:  [phone]

## 2018-06-01 NOTE — Interval H&P Note (Signed)
History and Physical Interval Note:  06/01/2018 7:59 AM  Cesar ActonJohn D Kloepfer  has presented today for surgery, with the diagnosis of Left ankle wound  The various methods of treatment have been discussed with the patient and family. After consideration of risks, benefits and other options for treatment, the patient has consented to  Procedure(s): IRRIGATION AND DEBRIDEMENT LEFT ANKLE,  WOUND VAC EXCHANGE (Left) as a surgical intervention .  The patient's history has been reviewed, patient examined, no change in status, stable for surgery.  I have reviewed the patient's chart and labs.  Questions were answered to the patient's satisfaction.     [employer],Nickolette Espinola,PA-C [facility] [phone]

## 2018-06-02 LAB — BASIC METABOLIC PANEL
Anion gap: 8 (ref 5–15)
BUN: 11 mg/dL (ref 6–20)
CO2: 27 mmol/L (ref 22–32)
Calcium: 8.9 mg/dL (ref 8.9–10.3)
Chloride: 100 mmol/L (ref 98–111)
Creatinine, Ser: 1.43 mg/dL — ABNORMAL HIGH (ref 0.61–1.24)
GFR calc Af Amer: >60 mL/min (ref >60)
GFR calc non Af Amer: 56 mL/min — ABNORMAL LOW (ref >60)
Glucose, Bld: 253 mg/dL — ABNORMAL HIGH (ref 70–99)
Potassium: 4.3 mmol/L (ref 3.5–5.1)
Sodium: 135 mmol/L (ref 135–145)

## 2018-06-02 LAB — GLUCOSE, CAPILLARY
GLUCOSE-CAPILLARY: 238 mg/dL — AB (ref 70–99)
Glucose-Capillary: 248 mg/dL — ABNORMAL HIGH (ref 70–99)
Glucose-Capillary: 254 mg/dL — ABNORMAL HIGH (ref 70–99)
Glucose-Capillary: 179 mg/dL — ABNORMAL HIGH (ref 70–99)

## 2018-06-02 MED ORDER — OXYCODONE-ACETAMINOPHEN 5-325 MG PO TABS: 1.0000 | ORAL_TABLET | ORAL | 0 refills | Status: AC | PRN

## 2018-06-02 MED ORDER — CEFTAROLINE IV (FOR PTA / DISCHARGE USE ONLY): 600.0000 mg | Freq: Two times a day (BID) | INTRAVENOUS | 0 refills | Status: AC

## 2018-06-02 NOTE — Care Management (Signed)
D/W d/c with [facility] nurse, Jeri ModenaPam Chandler, with patient and with Dr. Lajoyce Cornersuda.  Pt plans to d/c to brother's home at [address], [place], [name]  Home health nursing is not available in patient's area until 06/07/18.  [facility] does not provide nursing in this area and has exhausted all other home health options to find nursing for the patient.    Pam plans to meet with the patient and his brother on Monday for education on giving IV abx at home.  Patient can possibly d/c on 12/24 or 12/25, with plans to administer own antibiotics until RN available on 12/26.    Pt and Dr. Lajoyce Cornersuda advised.  Pt is agreeable to plan to d/c 12/24 or 12/25.

## 2018-06-02 NOTE — Progress Notes (Addendum)
Physical Therapy Treatment Patient Details Name: Cesar Ali MRN: [id number] DOB: Jan 12, 1966 Today's Date: 06/02/2018    History of Present Illness Pt is a 52 y.o. male with CKD 3, hx of CVA, IDDM, who sustained mechanical fall on 10/14 and developed left  trimalleolar ankle fracture. He underwent ORIF on 10/22. His ankle started to have drainage noted on 11/13 Dr Dion SaucierLandau took him to the OR on 10/14 for left ankle IX D, removal of HW- all HW removed, now NWB LLE.    PT Comments    Pt continues to need reminder cues to maintain NWB on left LE with certain aspects of mobility.  No stairs today due to pt's lunch tray arriving. Will need additional practice prior to discharge home. Acute PT to continue during pt's hospital stay.    Follow Up Recommendations  Home health PT;Supervision/Assistance - 24 hour     Equipment Recommendations  Wheelchair (measurements PT);Wheelchair cushion (measurements PT)    Precautions / Restrictions Precautions Precautions: Fall Other Brace: CAM walker on at all times Restrictions Weight Bearing Restrictions: Yes LLE Weight Bearing: Non weight bearing Other Position/Activity Restrictions: VAC to left LE    Mobility  Bed Mobility Overal bed mobility: Needs Assistance Bed Mobility: Supine to Sit     Supine to sit: Supervision     General bed mobility comments: increased time with HOB ~30 degrees and rail used.  Transfers Overall transfer level: Needs assistance Equipment used: Rolling walker (2 wheeled) Transfers: Sit to/from Stand Sit to Stand: Min guard         General transfer comment: cues for NWB on left LE with standing from bed to RW (bed raised to pt's home bed height).   Ambulation/Gait Ambulation/Gait assistance: Min guard Gait Distance (Feet): 8 Feet Assistive device: Rolling walker (2 wheeled) Gait Pattern/deviations: Trunk flexed(hop to pattern) Gait velocity: decreased   General Gait Details: pt maintained NWB on  left LE with gait from bed to chair for short distance. maintained NWB with sitting down to recliner as well.        Cognition Arousal/Alertness: Awake/alert Behavior During Therapy: WFL for tasks assessed/performed Overall Cognitive Status: No family/caregiver present to determine baseline cognitive functioning Area of Impairment: Safety/judgement                         Safety/Judgement: Decreased awareness of safety;Decreased awareness of deficits     General Comments: continues to need cues for NWB with transfers despite ability to state "No weight on leg" when asked       Pertinent Vitals/Pain Pain Assessment: 0-10 Pain Score: 5  Pain Location: LLE Pain Descriptors / Indicators: Aching;Dull;Grimacing Pain Intervention(s): Limited activity within patient's tolerance;Monitored during session;Premedicated before session;Repositioned     PT Goals (current goals can now be found in the care plan section) Acute Rehab PT Goals Patient Stated Goal: stated he wants to be able to manage stairs better PT Goal Formulation: With patient Time For Goal Achievement: 06/10/18 Potential to Achieve Goals: Fair Additional Goals Additional Goal #1: Pt will independently manage with wheelchair, including parts management and propulsion (600 feet) and turns Progress towards PT goals: Progressing toward goals    Frequency    Min 6X/week      PT Plan Current plan remains appropriate    AM-PAC PT "6 Clicks" Mobility   Outcome Measure  Help needed turning from your back to your side while in a flat bed without using bedrails?: None Help needed  moving from lying on your back to sitting on the side of a flat bed without using bedrails?: None Help needed moving to and from a bed to a chair (including a wheelchair)?: A Little Help needed standing up from a chair using your arms (e.g., wheelchair or bedside chair)?: A Little Help needed to walk in hospital room?: A Little Help  needed climbing 3-5 steps with a railing? : A Lot 6 Click Score: 19    End of Session Equipment Utilized During Treatment: Gait belt Activity Tolerance: Patient tolerated treatment well;No increased pain Patient left: in chair;with call bell/phone within reach Nurse Communication: Mobility status PT Visit Diagnosis: Unsteadiness on feet (R26.81);Other abnormalities of gait and mobility (R26.89);Difficulty in walking, not elsewhere classified (R26.2)     Time: [id number] PT Time Calculation (min) (ACUTE ONLY): 18 min  Charges:  $Therapeutic Activity: 8-22 mins                    Sallyanne KusterKathy Beniah Magnan, PTA, [facility] Acute Rehab Services [phone] 06/02/18, 12:34 PM   Sallyanne KusterBury, Glenys Snader 06/02/2018, 12:34 PM

## 2018-06-02 NOTE — Plan of Care (Signed)
Problem: Education: Goal: Knowledge of General Education information will improve Description Including pain rating scale, medication(s)/side effects and non-pharmacologic comfort measures Outcome: Progressing   Problem: Clinical Measurements: Goal: Ability to maintain clinical measurements within normal limits will improve Outcome: Progressing   Problem: Activity: Goal: Risk for activity intolerance will decrease Outcome: Progressing   Problem: Coping: Goal: Level of anxiety will decrease Outcome: Progressing   Problem: Elimination: Goal: Will not experience complications related to urinary retention Outcome: Progressing   Problem: Skin Integrity: Goal: Risk for impaired skin integrity will decrease Outcome: Progressing

## 2018-06-02 NOTE — Plan of Care (Signed)
The pt is progressing to goals as marked today.  Sallyanne KusterKathy Eira Alpert, PTA, CLT Acute Rehab Services [phone] 06/02/18, 12:37 PM

## 2018-06-02 NOTE — Discharge Summary (Signed)
Discharge Diagnoses:  Principal Problem:   Acute osteomyelitis of left fibula ([place]) Active Problems:   Open wound of left ankle   Surgeries: Procedure(s): REPEAT IRRIGATION AND DEBRIDEMENT ANKLE,WOUND VAC AND SKIN GRAFT on 06/01/2018    Consultants:   Discharged Condition: Improved  Hospital Course: Cesar Ali is an 52 y.o. male who was admitted 05/26/2018 with a chief complaint of open wound left ankle with osteomyelitis, with a final diagnosis of left ankle wound.  Patient was brought to the operating room on 06/01/2018 and underwent Procedure(s): REPEAT IRRIGATION AND DEBRIDEMENT ANKLE,WOUND VAC AND SKIN GRAFT.    Patient was given perioperative antibiotics:  Anti-infectives (From admission, onward)   Start     Dose/Rate Route Frequency Ordered Stop   06/02/18 0000  ceftaroline (TEFLARO) IVPB     600 mg Intravenous Every 12 hours 06/02/18 0807 07/12/18 2359   06/01/18 0845  ceFAZolin (ANCEF) IVPB 2g/100 mL premix  Status:  Discontinued     2 g 200 mL/hr over 30 Minutes Intravenous On call to O.R. 06/01/18 0833 06/01/18 1054   06/01/18 0834  ceFAZolin (ANCEF) 2-4 GM/100ML-% IVPB    Note to Pharmacy:  Laurita Quint   : cabinet override      06/01/18 0834 06/01/18 2044   05/30/18 1514  ceFAZolin (ANCEF) 2-4 GM/100ML-% IVPB    Note to Pharmacy:  Merryl Hacker   : cabinet override      05/30/18 1514 05/30/18 1748   05/30/18 0815  ceFAZolin (ANCEF) IVPB 2g/100 mL premix     2 g 200 mL/hr over 30 Minutes Intravenous On call to O.R. 05/30/18 0813 05/30/18 1748   05/27/18 1330  vancomycin (VANCOCIN) 1,250 mg in sodium chloride 0.9 % 250 mL IVPB     1,250 mg 166.7 mL/hr over 90 Minutes Intravenous Every 24 hours 05/27/18 1304     05/26/18 1800  cefTRIAXone (ROCEPHIN) 2 g in sodium chloride 0.9 % 100 mL IVPB     2 g 200 mL/hr over 30 Minutes Intravenous Every 24 hours 05/26/18 1703     05/26/18 1308  vancomycin (VANCOCIN) 1-5 GM/200ML-% IVPB    Note to Pharmacy:  Claybon Jabs   : cabinet override      05/26/18 1308 05/27/18 0114    .  Patient was given sequential compression devices, early ambulation, and aspirin for DVT prophylaxis.  Recent vital signs:  Patient Vitals for the past 24 hrs:  BP Temp Temp src Pulse Resp SpO2  06/02/18 0752 131/84 (!) 97.5 F (36.4 C) Oral 88 16 99 %  06/02/18 0439 (!) 163/88 98.3 F (36.8 C) Oral 92 16 96 %  06/01/18 1954 (!) 173/98 98.7 F (37.1 C) Oral 97 - 100 %  06/01/18 1101 (!) 173/99 97.7 F (36.5 C) Oral 83 16 100 %  06/01/18 1040 (!) 168/99 97.8 F (36.6 C) - 82 (!) 9 100 %  06/01/18 1034 (!) 161/94 - - 83 (!) 6 100 %  06/01/18 1032 (!) 171/99 - - 88 14 98 %  06/01/18 1022 (!) 172/105 - - 87 14 100 %  06/01/18 1005 (!) 171/96 97.8 F (36.6 C) - 86 (!) 9 100 %  06/01/18 1004 (!) 171/96 - - 85 14 100 %  .  Recent laboratory studies: No results found.  Discharge Medications:   Allergies as of 06/02/2018   No Known Allergies     Medication List    STOP taking these medications   cephALEXin 500 MG capsule Commonly known  as:  KEFLEX   docusate sodium 100 MG capsule Commonly known as:  COLACE   sulfamethoxazole-trimethoprim 800-160 MG tablet Commonly known as:  BACTRIM DS,SEPTRA DS     TAKE these medications   amLODipine 10 MG tablet Commonly known as:  NORVASC Take 10 mg by mouth every morning.   aspirin EC 81 MG tablet Take 81 mg by mouth daily.   atorvastatin 10 MG tablet Commonly known as:  LIPITOR Take 10 mg by mouth every morning.   ceftaroline  IVPB Commonly known as:  TEFLARO Inject 600 mg into the vein every 12 (twelve) hours. Indication:  Osteomyelitis Last Day of Therapy:  07/11/18 Labs - Once weekly:  CBC/D and BMP, Labs - Every other week:  ESR and CRP   clopidogrel 75 MG tablet Commonly known as:  PLAVIX Take 75 mg by mouth every morning.   furosemide 40 MG tablet Commonly known as:  LASIX Take 40 mg by mouth every morning.   gabapentin 300 MG capsule Commonly  known as:  NEURONTIN Take 1 capsule (300 mg total) by mouth 2 (two) times daily for 14 days. For 2 weeks post op for pain.   insulin regular 100 units/mL injection Commonly known as:  NOVOLIN R,HUMULIN R Inject into the skin 2 (two) times daily before a meal. Per sliding scale. 200-250= 2 units, 251-300= 4 units, 301-350= 6 units, 351-400= 8 units.   LEVEMIR 100 UNIT/ML injection Generic drug:  insulin detemir Inject 25 Units into the skin 2 (two) times daily.   lisinopril 10 MG tablet Commonly known as:  PRINIVIL,ZESTRIL Take 10 mg by mouth every morning.   methocarbamol 500 MG tablet Commonly known as:  ROBAXIN Take 1 tablet (500 mg total) by mouth every 8 (eight) hours as needed for muscle spasms.   ondansetron 4 MG tablet Commonly known as:  ZOFRAN Take 1 tablet (4 mg total) by mouth every 8 (eight) hours as needed for nausea or vomiting.   oxyCODONE-acetaminophen 5-325 MG tablet Commonly known as:  PERCOCET/ROXICET Take 1 tablet by mouth every 4 (four) hours as needed.   quinapril 10 MG tablet Commonly known as:  ACCUPRIL Take 10 mg by mouth every morning.            Home Infusion Instuctions  (From admission, onward)         Start     Ordered   06/02/18 0000  Home infusion instructions [facility] May follow [place] Dosing Protocol; May administer Cathflo as needed to maintain patency of vascular access device.; Flushing of vascular access device: per [facility] Protocol: 0.9% NaCl pre/post medica...    Question Answer Comment  Instructions May follow [place] Dosing Protocol   Instructions May administer Cathflo as needed to maintain patency of vascular access device.   Instructions Flushing of vascular access device: per [facility] Protocol: 0.9% NaCl pre/post medication administration and prn patency; Heparin 100 u/ml, 37m for implanted ports and Heparin 10u/ml, 572mfor all other central venous catheters.   Instructions May follow [facility] Anaphylaxis Protocol for  First Dose Administration in the home: 0.9% NaCl at 25-50 ml/hr to maintain IV access for protocol meds. Epinephrine 0.3 ml IV/IM PRN and Benadryl 25-50 IV/IM PRN s/s of anaphylaxis.   Instructions [facility] Infusion Coordinator (RN) to assist per patient IV care needs in the home PRN.      06/02/18 0807           Discharge Care Instructions  (From admission, onward)  Start     Ordered   06/02/18 0000  Non weight bearing    Question Answer Comment  Laterality left   Extremity Lower      06/02/18 0807          Diagnostic Studies: US Arterial Abi (screening Lower Extremity)  Result Date: 05/16/2018 CLINICAL DATA:  51 year old male with a history of nonhealing ulcer. EXAM: NONINVASIVE PHYSIOLOGIC VASCULAR STUDY OF BILATERAL LOWER EXTREMITIES TECHNIQUE: Evaluation of both lower extremities was performed at rest, including calculation of ankle-brachial indices, multiple segmental pressure evaluation, segmental Doppler and segmental pulse volume recording, as well as directed duplex. COMPARISON:  None. FINDINGS: Right ABI:  1.0 Left ABI:  1.0 Right Lower Extremity: Segmental Doppler of the right lower extremity demonstrates monophasic waveform of the dorsalis pedis and posterior tibial. Left Lower Extremity: Segmental Doppler of the left lower extremity demonstrates monophasic waveform of the dorsalis pedis and posterior tibial arteries. Directed duplex of the right lower extremity demonstrates triphasic common femoral artery, superficial femoral artery, popliteal artery, with monophasic posterior tibial artery and biphasic anterior tibial artery. Directed duplex of the left lower extremity demonstrates triphasic common femoral artery, profunda femoris, SFA, popliteal artery. Biphasic waveform of the posterior tibial artery and anterior tibial artery at the ankle. IMPRESSION: Resting ABI the bilateral lower extremities within normal limits. Segmental exam and directed duplex  demonstrates evidence of developing tibial disease. Signed, Dulcy Fanny. Dellia Nims, RPVI [facility] Electronically Signed   By: Corrie Mckusick D.O.   On: 05/16/2018 16:12   US Arterial Lower Extremity Duplex Bilateral  Result Date: 05/16/2018 CLINICAL DATA:  52 year old male with a history of nonhealing ulcer. EXAM: NONINVASIVE PHYSIOLOGIC VASCULAR STUDY OF BILATERAL LOWER EXTREMITIES TECHNIQUE: Evaluation of both lower extremities was performed at rest, including calculation of ankle-brachial indices, multiple segmental pressure evaluation, segmental Doppler and segmental pulse volume recording, as well as directed duplex. COMPARISON:  None. FINDINGS: Right ABI:  1.0 Left ABI:  1.0 Right Lower Extremity: Segmental Doppler of the right lower extremity demonstrates monophasic waveform of the dorsalis pedis and posterior tibial. Left Lower Extremity: Segmental Doppler of the left lower extremity demonstrates monophasic waveform of the dorsalis pedis and posterior tibial arteries. Directed duplex of the right lower extremity demonstrates triphasic common femoral artery, superficial femoral artery, popliteal artery, with monophasic posterior tibial artery and biphasic anterior tibial artery. Directed duplex of the left lower extremity demonstrates triphasic common femoral artery, profunda femoris, SFA, popliteal artery. Biphasic waveform of the posterior tibial artery and anterior tibial artery at the ankle. IMPRESSION: Resting ABI the bilateral lower extremities within normal limits. Segmental exam and directed duplex demonstrates evidence of developing tibial disease. Signed, Dulcy Fanny. Dellia Nims, RPVI [facility] Electronically Signed   By: Corrie Mckusick D.O.   On: 05/16/2018 16:12   Ir Fluoro Guide Cv Line Right  Result Date: 05/28/2018 INDICATION: Ankle infection.  Long-term IV antibiotic  therapy. EXAM: TUNNELED RIGHT JUGULAR PICC LINE PLACEMENT WITH ULTRASOUND AND FLUOROSCOPIC GUIDANCE MEDICATIONS: None ANESTHESIA/SEDATION: None FLUOROSCOPY TIME:  Fluoroscopy Time:  minutes 12 seconds ( mGy). COMPLICATIONS: None immediate. PROCEDURE: The patient was advised of the possible risks and complications and agreed to undergo the procedure. The patient was then brought to the angiographic suite for the procedure. The right neck was prepped with chlorhexidine, draped in the usual sterile fashion using maximum barrier technique (cap and mask, sterile gown, sterile gloves, large sterile sheet, hand hygiene and cutaneous antiseptic).  Local anesthesia was attained by infiltration with 1% lidocaine. Ultrasound demonstrated patency of the right jugular vein, and this was documented with an image. Under real-time ultrasound guidance, this vein was accessed with a 21 gauge micropuncture needle and image documentation was performed. The needle was exchanged over a guidewire for a peel-away sheath through which a 20 cm 5 [place] double lumen power injectable PICC was advanced, and positioned with its tip at the lower SVC/right atrial junction. The cuff was positioned in the subcutaneous tract. Fluoroscopy during the procedure and fluoro spot radiograph confirms appropriate catheter position. The catheter was flushed, secured to the skin with Prolene sutures, and covered with a sterile dressing. IMPRESSION: Successful placement of a tunneled right jugular PICC with sonographic and fluoroscopic guidance. The catheter is ready for use. Electronically Signed   By: Marybelle Killings M.D.   On: 05/28/2018 12:50   Ir US Guide Vasc Access Right  Result Date: 05/28/2018 INDICATION: Ankle infection.  Long-term IV antibiotic therapy. EXAM: TUNNELED RIGHT JUGULAR PICC LINE PLACEMENT WITH ULTRASOUND AND FLUOROSCOPIC GUIDANCE MEDICATIONS: None ANESTHESIA/SEDATION: None FLUOROSCOPY TIME:  Fluoroscopy Time:  minutes 12 seconds ( mGy).  COMPLICATIONS: None immediate. PROCEDURE: The patient was advised of the possible risks and complications and agreed to undergo the procedure. The patient was then brought to the angiographic suite for the procedure. The right neck was prepped with chlorhexidine, draped in the usual sterile fashion using maximum barrier technique (cap and mask, sterile gown, sterile gloves, large sterile sheet, hand hygiene and cutaneous antiseptic). Local anesthesia was attained by infiltration with 1% lidocaine. Ultrasound demonstrated patency of the right jugular vein, and this was documented with an image. Under real-time ultrasound guidance, this vein was accessed with a 21 gauge micropuncture needle and image documentation was performed. The needle was exchanged over a guidewire for a peel-away sheath through which a 20 cm 5 [place] double lumen power injectable PICC was advanced, and positioned with its tip at the lower SVC/right atrial junction. The cuff was positioned in the subcutaneous tract. Fluoroscopy during the procedure and fluoro spot radiograph confirms appropriate catheter position. The catheter was flushed, secured to the skin with Prolene sutures, and covered with a sterile dressing. IMPRESSION: Successful placement of a tunneled right jugular PICC with sonographic and fluoroscopic guidance. The catheter is ready for use. Electronically Signed   By: Marybelle Killings M.D.   On: 05/28/2018 12:50   Dg Ankle Left Port  Result Date: 05/26/2018 CLINICAL DATA:  Osteomyelitis of left fibula EXAM: PORTABLE LEFT ANKLE - 2 VIEW COMPARISON:  03/26/2018 FINDINGS: There are screw tracks within the distal fibula compatible with removed hardware. Cortical loosening and irregularity noted in the distal left fibula concerning for osteomyelitis. No tibial abnormality. Oblique fracture is noted in the distal fibula, best seen on the lateral view. IMPRESSION: Irregular lucency in the distal left fibular cortex laterally concerning  for osteomyelitis. Removed hardware. Oblique distal fibular fracture evident on the lateral view. Electronically Signed   By: Rolm Baptise M.D.   On: 05/26/2018 16:12    Patient benefited maximally from their hospital stay and there were no complications.     Disposition: Discharge disposition: 01-Home or Self Care      Discharge Instructions    Call MD / Call 911   Complete by:  As directed    If you experience chest pain or shortness of breath, CALL 911 and be transported to the hospital emergency room.  If you develope a fever above 101 F,  pus (white drainage) or increased drainage or redness at the wound, or calf pain, call your surgeon's office.   Constipation Prevention   Complete by:  As directed    Drink plenty of fluids.  Prune juice may be helpful.  You may use a stool softener, such as Colace (over the counter) 100 mg twice a day.  Use MiraLax (over the counter) for constipation as needed.   Diet - low sodium heart healthy   Complete by:  As directed    Elevate operative extremity   Complete by:  As directed    Home infusion instructions [facility] May follow [place] Dosing Protocol; May administer Cathflo as needed to maintain patency of vascular access device.; Flushing of vascular access device: per [facility] Protocol: 0.9% NaCl pre/post medica...   Complete by:  As directed    Instructions:  May follow [place] Dosing Protocol   Instructions:  May administer Cathflo as needed to maintain patency of vascular access device.   Instructions:  Flushing of vascular access device: per [facility] Protocol: 0.9% NaCl pre/post medication administration and prn patency; Heparin 100 u/ml, 8m for implanted ports and Heparin 10u/ml, 560mfor all other central venous catheters.   Instructions:  May follow [facility] Anaphylaxis Protocol for First Dose Administration in the home: 0.9% NaCl at 25-50 ml/hr to maintain IV access for protocol meds. Epinephrine 0.3 ml IV/IM PRN and Benadryl 25-50  IV/IM PRN s/s of anaphylaxis.   Instructions:  [facility] Coordinator (RN) to assist per patient IV care needs in the home PRN.   Increase activity slowly as tolerated   Complete by:  As directed    Negative Pressure Wound Therapy - Incisional   Complete by:  As directed    Obtain the Praveena plus wound VAC from the operating room and patient is to be discharged on the Praveena plus wound VAC.  Show patient how to plug the unit in to ensure it stays charged.  This will need to remain in place for 1 to 2 weeks at discharge.   Negative Pressure Wound Therapy - Incisional   Complete by:  As directed    Attach to prevena plus pump, instruct patient in plugging unit in   Non weight bearing   Complete by:  As directed    Laterality:  left   Extremity:  Lower     Follow-up Information    CaGolden CircleFNP Follow up.   Specialties:  Family Medicine, Infectious Diseases Why:  07/09/17 @ 9:30am. Please call the clinic to reschedule if you are unable to make this appointment.  Contact information: [phone]        DuNewt MinionMD In 1 week.   Specialty:  Orthopedic Surgery Contact information: [phone]          Signed: MaNewt Minion2/21/2019, 8:08 AM

## 2018-06-03 LAB — GLUCOSE, CAPILLARY
GLUCOSE-CAPILLARY: 259 mg/dL — AB (ref 70–99)
Glucose-Capillary: 226 mg/dL — ABNORMAL HIGH (ref 70–99)
Glucose-Capillary: 266 mg/dL — ABNORMAL HIGH (ref 70–99)
Glucose-Capillary: 140 mg/dL — ABNORMAL HIGH (ref 70–99)

## 2018-06-03 LAB — BASIC METABOLIC PANEL
Anion gap: 9 (ref 5–15)
BUN: 14 mg/dL (ref 6–20)
CO2: 28 mmol/L (ref 22–32)
Calcium: 8.9 mg/dL (ref 8.9–10.3)
Chloride: 99 mmol/L (ref 98–111)
Creatinine, Ser: 1.46 mg/dL — ABNORMAL HIGH (ref 0.61–1.24)
GFR calc Af Amer: >60 mL/min (ref >60)
GFR calc non Af Amer: 55 mL/min — ABNORMAL LOW (ref >60)
Glucose, Bld: 244 mg/dL — ABNORMAL HIGH (ref 70–99)
Potassium: 4.3 mmol/L (ref 3.5–5.1)
Sodium: 136 mmol/L (ref 135–145)

## 2018-06-03 NOTE — Progress Notes (Signed)
Patient ID: Cesar ActonJohn D Ali, male   DOB: 01-16-1966, 52 y.o.   MRN: [id number] The patient is doing well overall.  His left ankle wound VAC has a good seal.  The plan is to send him home with his brother later this week with a home VAC.

## 2018-06-03 NOTE — Plan of Care (Signed)
Problem: Education: Goal: Knowledge of General Education information will improve Description Including pain rating scale, medication(s)/side effects and non-pharmacologic comfort measures Outcome: Progressing   Problem: Clinical Measurements: Goal: Ability to maintain clinical measurements within normal limits will improve Outcome: Progressing   Problem: Activity: Goal: Risk for activity intolerance will decrease Outcome: Progressing   Problem: Coping: Goal: Level of anxiety will decrease Outcome: Progressing   Problem: Elimination: Goal: Will not experience complications related to urinary retention Outcome: Progressing   Problem: Skin Integrity: Goal: Risk for impaired skin integrity will decrease Outcome: Progressing   

## 2018-06-03 NOTE — Progress Notes (Signed)
Pharmacy Antibiotic Note  Cesar Ali is a 52 y.o. male admitted on 05/26/2018 with osteomyelitis. He sustained a mechanical fall on 03/26/18 and developed left ankle fracture. He underwent ORIF on 04/03/18 and started to note drainage on 04/25/18. He was started on PO antibiotics and sent to wound care for management. The wound continued to have dehiscence, purulent drainage, and mild erythema around the wound. He is now s/p left ankle I&D and removal of hardware on 05/26/18. Renal function improving. Trough back at 8 (~25 hour level), Peak back at 36 (2 hour level). AUC calculated at 481.6. SCR slightly trending upward, will monitor trend and levels as indicated. Plan to DC on ceftaroline with end date of 07/11/18. OPAT orders entered.    Plan: Continue vanc 1250mg  IV Q24H CTX 2gm IV Q24H per MD Monitor renal fxn, clinical progress DC on ceftaroline 600mg  Q12hrs   Height: 5' 11.5" (181.6 cm) Weight: 161 lb (73 kg) IBW/kg (Calculated) : 76.45  Temp (24hrs), Avg:98.4 F (36.9 C), Min:98 F (36.7 C), Max:98.8 F (37.1 C)  Recent Labs  Lab 05/28/18 0319  05/30/18 1050 05/31/18 0216 05/31/18 1310 05/31/18 1544 06/01/18 0508 06/02/18 0321 06/03/18 0436  WBC 7.1  --   --   --   --   --   --   --   --   CREATININE 1.83*   < > 1.27* 1.21  --   --  1.22 1.43* 1.46*  VANCOTROUGH  --   --   --   --  8*  --   --   --   --   VANCOPEAK  --   --   --   --   --  36  --   --   --    < > = values in this interval not displayed.    Estimated Creatinine Clearance: 61.8 mL/min (A) (by C-G formula based on SCr of 1.46 mg/dL (H)).    No Known Allergies   Vanc 12/14>> CTX 12/14>> Keflex 12/6 PTA >> 12/14 Bactrim 12/6 PTA >> 12/14   Thank you for allowing us to participate in this patients care.   Signe Coltonya C Johnson, PharmD Please utilize [facility] (under [facility]) for appropriate number for your unit pharmacist. 06/03/2018 1:07 PM

## 2018-06-04 ENCOUNTER — Encounter (HOSPITAL_COMMUNITY): Payer: Self-pay | Admitting: Orthopedic Surgery

## 2018-06-04 DIAGNOSIS — E43 Unspecified severe protein-calorie malnutrition: Secondary | ICD-10-CM

## 2018-06-04 LAB — GLUCOSE, CAPILLARY
Glucose-Capillary: 245 mg/dL — ABNORMAL HIGH (ref 70–99)
Glucose-Capillary: 259 mg/dL — ABNORMAL HIGH (ref 70–99)
Glucose-Capillary: 192 mg/dL — ABNORMAL HIGH (ref 70–99)
Glucose-Capillary: 211 mg/dL — ABNORMAL HIGH (ref 70–99)

## 2018-06-04 LAB — BASIC METABOLIC PANEL
Anion gap: 11 (ref 5–15)
BUN: 17 mg/dL (ref 6–20)
CO2: 29 mmol/L (ref 22–32)
Calcium: 8.9 mg/dL (ref 8.9–10.3)
Chloride: 96 mmol/L — ABNORMAL LOW (ref 98–111)
Creatinine, Ser: 1.28 mg/dL — ABNORMAL HIGH (ref 0.61–1.24)
GFR calc Af Amer: >60 mL/min (ref >60)
GFR calc non Af Amer: >60 mL/min (ref >60)
Glucose, Bld: 269 mg/dL — ABNORMAL HIGH (ref 70–99)
POTASSIUM: 4.6 mmol/L (ref 3.5–5.1)
Sodium: 136 mmol/L (ref 135–145)

## 2018-06-04 MED ORDER — ENOXAPARIN SODIUM 40 MG/0.4ML ~~LOC~~ SOLN
40.0000 mg | SUBCUTANEOUS | Status: DC
  Administered 2018-06-05: 40 mg via SUBCUTANEOUS
  Filled 2018-06-04: qty 0.4

## 2018-06-04 NOTE — Progress Notes (Signed)
PT Cancellation Note  Patient Details Name: Cesar Ali MRN: [id number] DOB: [birth year]   Cancelled Treatment:    Reason Eval/Treat Not Completed: Patient declined, still sleeping, come back later. Informed pt that this PTA is not here later (only in am) and it may be tomorrow before we can came back. He continued to refuse at this time.   Sallyanne KusterKathy Emerita Berkemeier, PTA, CLT Acute Rehab Services [phone] 06/04/18, 10:36 AM   Sallyanne KusterBury, Cesar Ali 06/04/2018, 10:35 AM

## 2018-06-04 NOTE — Progress Notes (Signed)
Occupational Therapy Treatment Patient Details Name: Cesar Ali MRN: [id number] DOB: [birth year] Today's Date: 06/04/2018    History of present illness Pt is a 52 y.o. male with CKD 3, hx of CVA, IDDM, who sustained mechanical fall on 10/14 and developed left  trimalleolar ankle fracture. He underwent ORIF on 10/22. His ankle started to have drainage noted on 11/13 Dr Dion SaucierLandau took him to the OR on 12/14 for left ankle IX D, removal of HW- all HW removed, now NWB LLE.   OT comments  Pt progressing towards established OT goals. Educating pt on compensatory techniques for LB dressing and wound vac management. Pt demonstrating understanding and donned shorts with Min Guard A for safety and Min cues for WB status. Continue to recommend dc to home with HHOT and will continue to follow acutely as admitted.    Follow Up Recommendations  Home health OT    Equipment Recommendations  Tub/shower bench    Recommendations for Other Services      Precautions / Restrictions Precautions Precautions: Fall Required Braces or Orthoses: Other Brace Other Brace: CAM walker on at all times Restrictions Weight Bearing Restrictions: Yes LLE Weight Bearing: Non weight bearing Other Position/Activity Restrictions: VAC to left LE       Mobility Bed Mobility               General bed mobility comments: In recliner upon arrival  Transfers Overall transfer level: Needs assistance Equipment used: Rolling walker (2 wheeled) Transfers: Sit to/from Stand Sit to Stand: Min guard         General transfer comment: cues for NWB on left LE. Min Guard A for safety    Balance Overall balance assessment: Needs assistance Sitting-balance support: No upper extremity supported Sitting balance-Leahy Scale: Good Sitting balance - Comments: EBO for figure 4 demonstration   Standing balance support: Bilateral upper extremity supported;During functional activity Standing balance-Leahy Scale:  Poor Standing balance comment: dependent on BUE support; but able to maintain NWB this session with encouragement and cues                           ADL either performed or assessed with clinical judgement   ADL Overall ADL's : Needs assistance/impaired                     Lower Body Dressing: Min guard;Sit to/from stand Lower Body Dressing Details (indicate cue type and reason): Educating pt on compensatopry techniques for LB dressing and how to manage wound vac. Pt demosntrating understanding and donned shorts with Min Guard A for safety in standing.  Toilet Transfer: Minimal assistance;Ambulation;RW(simulated at reclienr)           Functional mobility during ADLs: Minimal assistance;Cueing for sequencing;Rolling walker General ADL Comments: Pt currently requiring Min A and Min cues for dressing. Provdiing education on safe LB dressing and management of wound vac     Vision       Perception     Praxis      Cognition Arousal/Alertness: Awake/alert Behavior During Therapy: WFL for tasks assessed/performed Overall Cognitive Status: No family/caregiver present to determine baseline cognitive functioning Area of Impairment: Safety/judgement                         Safety/Judgement: Decreased awareness of safety;Decreased awareness of deficits     General Comments: Requiring cues for problem solving during ADLs. Also requiring Min cues  to maintain NWB status.         Exercises     Shoulder Instructions       General Comments      Pertinent Vitals/ Pain       Pain Assessment: 0-10 Faces Pain Scale: Hurts a little bit Pain Location: LLE Pain Descriptors / Indicators: Aching;Dull;Grimacing Pain Intervention(s): Monitored during session;Limited activity within patient's tolerance;Repositioned  Home Living                                          Prior Functioning/Environment              Frequency  Min 3X/week         Progress Toward Goals  OT Goals(current goals can now be found in the care plan section)  Progress towards OT goals: Progressing toward goals  Acute Rehab OT Goals Patient Stated Goal: stated he wants to be able to manage stairs better OT Goal Formulation: With patient Time For Goal Achievement: 06/11/18 Potential to Achieve Goals: Good ADL Goals Pt Will Perform Lower Body Bathing: with set-up;sitting/lateral leans Pt Will Perform Lower Body Dressing: with min guard assist;with caregiver independent in assisting;sit to/from stand Pt Will Transfer to Toilet: with modified independence;ambulating Pt Will Perform Toileting - Clothing Manipulation and hygiene: with modified independence;sitting/lateral leans Pt Will Perform Tub/Shower Transfer: Tub transfer;with min guard assist;with caregiver independent in assisting;shower seat;3 in 1;rolling walker  Plan Discharge plan remains appropriate;Frequency remains appropriate    Co-evaluation                 AM-PAC OT "6 Clicks" Daily Activity     Outcome Measure   Help from another person eating meals?: None Help from another person taking care of personal grooming?: None(in seated position) Help from another person toileting, which includes using toliet, bedpan, or urinal?: A Little Help from another person bathing (including washing, rinsing, drying)?: A Little Help from another person to put on and taking off regular upper body clothing?: None Help from another person to put on and taking off regular lower body clothing?: A Little 6 Click Score: 21    End of Session Equipment Utilized During Treatment: Rolling walker;Other (comment)(CAM boot)  OT Visit Diagnosis: Unsteadiness on feet (R26.81);Other abnormalities of gait and mobility (R26.89);History of falling (Z91.81);Pain Pain - Right/Left: Left Pain - part of body: Ankle and joints of foot   Activity Tolerance Patient tolerated treatment well   Patient Left  in chair;with call bell/phone within reach   Nurse Communication Mobility status;Precautions;Weight bearing status        Time: [id number] OT Time Calculation (min): 22 min  Charges: OT General Charges $OT Visit: 1 Visit OT Treatments $Self Care/Home Management : 8-22 mins  Cesar Ali MSOT, OTR/L Acute Rehab Pager: [phone] Office: [phone]   Theodoro GristCharis M Treena Cosman 06/04/2018, 4:44 PM

## 2018-06-04 NOTE — Progress Notes (Signed)
[facility] over the last [facility] [place] [facility] [employer]. Due to the holiday and day by day DC, at time of DC, [facility] in [facility] geographic area are not able to staff until Thursday at earliest.  [facility] Thursday between 10-12 PM for admission visit/further teaching.  Phone #:  [phone]  Contact Person:  Steward DroneBrenda. [facility] Coordinator will provide teaching with patient's brother in the hospital tomorrow before DC home.  Pt and brother feel they can support IVABX independently at home.  If patient discharges after hours, please call [phone].   Sedalia Mutaamela S Chandler 06/04/2018, 2:38 PM

## 2018-06-04 NOTE — Progress Notes (Signed)
Inpatient Diabetes Program Recommendations  AACE/ADA: New Consensus Statement on Inpatient Glycemic Control (2015)  Target Ranges:  Prepandial:   less than 140 mg/dL      Peak postprandial:   less than 180 mg/dL (1-2 hours)      Critically ill patients:  140 - 180 mg/dL   Lab Results  Component Value Date   GLUCAP 245 (H) 06/04/2018   HGBA1C 11.7 (H) 05/30/2018    RResults for Cesar ActonMCALLISTER, Jorey D (MRN [id number]) as of 06/04/2018 08:58  Ref. Range 06/03/2018 06:01 06/03/2018 11:59 06/03/2018 16:35 06/03/2018 21:58 06/04/2018 06:21  Glucose-Capillary Latest Ref Range: 70 - 99 mg/dL 308259 (H) 657226 (H) 846266 (H) 140 (H) 245 (H)  eview of Glycemic Control  Home DM Meds: Levemir 25 units BID                             Regular Insulin 2-8 units BID per SSI  Current Orders: Levemir 15 units QHS                            Novolog Moderate Correction Scale/ SSI (0-15 units) TID AC   MD- Please consider the following in-hospital insulin adjustments:  1. Increase Levemir to 25 units QHS (50% total home dose)  2. Start Novolog Meal Coverage Novolog 4 units TID with meals if eats 50% meal  Thank you, Billy FischerJudy E. Oliviya Gilkison, RN, MSN, CDE  Diabetes Coordinator Inpatient Glycemic Control Team Team Pager [phone] (8am-5pm) 06/04/2018 9:01 AM

## 2018-06-04 NOTE — Care Management (Signed)
Case manager gave patient a copy of the order for a tub bench. His brother will need to take order and proof of medicaid coverage to a [facility] or Pharmacy to pick it up. [facility] supplier is out of DME at this time.

## 2018-06-04 NOTE — Plan of Care (Signed)
Problem: Education: Goal: Knowledge of General Education information will improve Description Including pain rating scale, medication(s)/side effects and non-pharmacologic comfort measures Outcome: Progressing   Problem: Clinical Measurements: Goal: Ability to maintain clinical measurements within normal limits will improve Outcome: Progressing Goal: Respiratory complications will improve Outcome: Progressing Goal: Cardiovascular complication will be avoided Outcome: Progressing   Problem: Activity: Goal: Risk for activity intolerance will decrease Outcome: Progressing   Problem: Coping: Goal: Level of anxiety will decrease Outcome: Progressing   Problem: Elimination: Goal: Will not experience complications related to urinary retention Outcome: Progressing   Problem: Skin Integrity: Goal: Risk for impaired skin integrity will decrease Outcome: Progressing

## 2018-06-04 NOTE — Progress Notes (Signed)
PT Cancellation Note  Patient Details Name: Cesar Ali MRN: [id number] DOB: July 14, 1965   Cancelled Treatment:    Reason Eval/Treat Not Completed: Patient declined, sleeping right now. Requested PT come back in the afternoon. Will follow up later today as time allows vs tomorrow.   Sallyanne KusterKathy Tressy Kunzman, PTA, CLT Acute Rehab Services [phone] 06/04/18, 9:11 AM   Sallyanne KusterBury, Cesar Ali 06/04/2018, 9:10 AM

## 2018-06-04 NOTE — Progress Notes (Signed)
Subjective: 3 Days Post-Op Procedure(s) (LRB): REPEAT IRRIGATION AND DEBRIDEMENT ANKLE,WOUND VAC AND SKIN GRAFT (Left) Patient reports pain as mild.   [name] once [facility].  Objective: Vital signs in last 24 hours: Temp:  [98.6 F (37 C)-98.8 F (37.1 C)] 98.8 F (37.1 C) (12/23 0626) Pulse Rate:  [82-92] 82 (12/23 0626) Resp:  [12] 12 (12/23 0626) BP: (137-176)/(83-90) 156/83 (12/23 0626) SpO2:  [99 %-100 %] 99 % (12/23 0626)  Intake/Output from previous day: 12/22 0701 - 12/23 0700 In: 7997.7 [P.O.:720; I.V.:6927.7; IV Piggyback:350] Out: 1275 [Urine:1275] Intake/Output this shift: No intake/output data recorded.  No results for input(s): HGB in the last 72 hours. No results for input(s): WBC, RBC, HCT, PLT in the last 72 hours. Recent Labs    06/03/18 0436 06/04/18 0324  NA 136 136  K 4.3 4.6  CL 99 96*  CO2 28 29  BUN 14 17  CREATININE 1.46* 1.28*  GLUCOSE 244* 269*  CALCIUM 8.9 8.9   No results for input(s): LABPT, INR in the last 72 hours.  VAC dressing intact and functioning well to left ankle. VAC canister with ~ 75 cc serosnaguinous drainage.     Assessment/Plan: 3 Days Post-Op Procedure(s) (LRB): REPEAT IRRIGATION AND DEBRIDEMENT ANKLE,WOUND VAC AND SKIN GRAFT (Left) Discharge home with home health once [facility]HC can begin IV antibiotics in the brother's home. Will plan to DC with Prevena VAC once ready to DC later this week.     [employer]URN,Demontrez Rindfleisch,PA-C [facility] [phone] --

## 2018-06-05 LAB — GLUCOSE, CAPILLARY
Glucose-Capillary: 244 mg/dL — ABNORMAL HIGH (ref 70–99)
Glucose-Capillary: 275 mg/dL — ABNORMAL HIGH (ref 70–99)

## 2018-06-05 LAB — BASIC METABOLIC PANEL
Anion gap: 11 (ref 5–15)
BUN: 22 mg/dL — ABNORMAL HIGH (ref 6–20)
CO2: 27 mmol/L (ref 22–32)
Calcium: 9.1 mg/dL (ref 8.9–10.3)
Chloride: 96 mmol/L — ABNORMAL LOW (ref 98–111)
Creatinine, Ser: 1.44 mg/dL — ABNORMAL HIGH (ref 0.61–1.24)
GFR calc Af Amer: >60 mL/min (ref >60)
GFR calc non Af Amer: 56 mL/min — ABNORMAL LOW (ref >60)
Glucose, Bld: 279 mg/dL — ABNORMAL HIGH (ref 70–99)
Potassium: 4.9 mmol/L (ref 3.5–5.1)
Sodium: 134 mmol/L — ABNORMAL LOW (ref 135–145)

## 2018-06-05 MED ORDER — HEPARIN SOD (PORK) LOCK FLUSH 100 UNIT/ML IV SOLN
250.0000 [IU] | INTRAVENOUS | Status: AC | PRN
  Administered 2018-06-05: 250 [IU]

## 2018-06-05 MED ORDER — SODIUM CHLORIDE 0.9 % IV SOLN
600.0000 mg | Freq: Once | INTRAVENOUS | Status: AC
  Administered 2018-06-05: 600 mg via INTRAVENOUS
  Filled 2018-06-05: qty 600

## 2018-06-05 NOTE — Plan of Care (Signed)
  Problem: Education: Goal: Knowledge of General Education information will improve Description: Including pain rating scale, medication(s)/side effects and non-pharmacologic comfort measures Outcome: Progressing   Problem: Coping: Goal: Level of anxiety will decrease Outcome: Progressing   

## 2018-06-05 NOTE — Progress Notes (Signed)
Physical Therapy Treatment Patient Details Name: Cesar ActonJohn D Loftus MRN: [id number] DOB: [birth year] Today's Date: 06/05/2018    History of Present Illness Pt is a 52 y.o. male with CKD 3, hx of CVA, IDDM, who sustained mechanical fall on 10/14 and developed left  trimalleolar ankle fracture. He underwent ORIF on 10/22. His ankle started to have drainage noted on 11/13 Dr Dion SaucierLandau took him to the OR on 12/14 for left ankle IX D, removal of HW- all HW removed, now NWB LLE.    PT Comments    Focused on stair negotiation with RW and no handrail to mimic brothers home set up. Brother present and returned demonstrated how to assist pt backwards up stairs with RW. Pt remains slightly impulsive with decreased safety awareness and about 80% compliant with L LE NWB. Pt to benefit from w/c for long distance mobility as pt unable to maintain L LE NWB > 10 feet. Acute PT to cont to follow.    Follow Up Recommendations  Home health PT;Supervision/Assistance - 24 hour     Equipment Recommendations  Wheelchair (measurements PT);Wheelchair cushion (measurements PT)    Recommendations for Other Services OT consult     Precautions / Restrictions Precautions Precautions: Fall Required Braces or Orthoses: Other Brace Other Brace: CAM walker on at all times, L LE wound fac Restrictions Weight Bearing Restrictions: Yes LLE Weight Bearing: Non weight bearing Other Position/Activity Restrictions: VAC on the L LE    Mobility  Bed Mobility Overal bed mobility: Needs Assistance Bed Mobility: Supine to Sit     Supine to sit: Supervision     General bed mobility comments: supervision for wound vac management  Transfers Overall transfer level: Needs assistance Equipment used: Rolling walker (2 wheeled) Transfers: Sit to/from Stand Sit to Stand: Min guard         General transfer comment: able to maintain L LE NWB, min guard to steady during transition of hands from bed to  RW  Ambulation/Gait Ambulation/Gait assistance: Min guard Gait Distance (Feet): 10 Feet Assistive device: Rolling walker (2 wheeled) Gait Pattern/deviations: Step-to pattern Gait velocity: decreased   General Gait Details: pt able ot maintain L LE NWB, assist for wound vac management, good walker management   Stairs Stairs: Yes Stairs assistance: Mod assist Stair Management: No rails;With walker Number of Stairs: 2(x2 trials) General stair comments: brother present for education and returned demonstrated. pt unable to clear R foot up on step without putting L LE down, pt reports <25% WBing. directional verbal cues for walker sequencing and leading with L LE going down    Wheelchair Mobility    Modified Rankin (Stroke Patients Only)       Balance Overall balance assessment: Needs assistance Sitting-balance support: No upper extremity supported Sitting balance-Leahy Scale: Good Sitting balance - Comments: pt able to don R shoe without assist   Standing balance support: Bilateral upper extremity supported Standing balance-Leahy Scale: Poor Standing balance comment: dependent on RW due to L LE NWB                            Cognition Arousal/Alertness: Awake/alert Behavior During Therapy: WFL for tasks assessed/performed Overall Cognitive Status: Within Functional Limits for tasks assessed                                        Exercises  General Comments General comments (skin integrity, edema, etc.): dressing intact, assisted to bathroom, indep with hygiene      Pertinent Vitals/Pain      Home Living                      Prior Function            PT Goals (current goals can now be found in the care plan section) Acute Rehab PT Goals Patient Stated Goal: didn't state Progress towards PT goals: Progressing toward goals    Frequency    Min 6X/week      PT Plan Current plan remains appropriate     Co-evaluation              AM-PAC PT "6 Clicks" Mobility   Outcome Measure  Help needed turning from your back to your side while in a flat bed without using bedrails?: None Help needed moving from lying on your back to sitting on the side of a flat bed without using bedrails?: None Help needed moving to and from a bed to a chair (including a wheelchair)?: A Little Help needed standing up from a chair using your arms (e.g., wheelchair or bedside chair)?: A Little Help needed to walk in hospital room?: A Little Help needed climbing 3-5 steps with a railing? : A Lot 6 Click Score: 19    End of Session Equipment Utilized During Treatment: Gait belt Activity Tolerance: Patient tolerated treatment well;No increased pain Patient left: in chair;with call bell/phone within reach Nurse Communication: Mobility status PT Visit Diagnosis: Unsteadiness on feet (R26.81);Other abnormalities of gait and mobility (R26.89);Difficulty in walking, not elsewhere classified (R26.2)     Time: 1610-96041142-1211 PT Time Calculation (min) (ACUTE ONLY): 29 min  Charges:  $Gait Training: 8-22 mins $Therapeutic Activity: 8-22 mins                     Lewis ShockAshly Cutler Sunday, PT, DPT Acute Rehabilitation Services Pager #: [phone] Office #: [phone]    Iona Hansenshly M Christan Defranco 06/05/2018, 12:21 PM

## 2018-06-05 NOTE — Progress Notes (Signed)
Pt given oral and written discharge instructions with brother present. Was educated on IV therapy and central line care [facility] RN. Wound vac switched to Prevena vac and pt and brother educated on use. Central line flushed and capped for discharge.

## 2018-06-05 NOTE — Progress Notes (Signed)
Nutrition Follow-up  DOCUMENTATION CODES:   Severe malnutrition in context of chronic illness, Underweight  INTERVENTION:    Continue Ensure Max PO BID, each supplement provides 150 kcal and 30 gm protein  Continue MVI daily  NUTRITION DIAGNOSIS:   Severe Malnutrition related to acute illness(ankle fracture s/p surgery with wound dehiscence/infection) as evidenced by percent weight loss, severe muscle depletion(8% weight loss within 3 months).  Ongoing  GOAL:   Patient will meet greater than or equal to 90% of their needs  Met   MONITOR:   PO intake, Supplement acceptance, Skin, Labs  ASSESSMENT:   52 yo male with PMH of HTN, HLD, ankle fracture, CKD stage III, DM-1, and CVA who was admitted with left ankle infection and wound dehiscence s/p ORIF 2 months ago.  PO intake is excellent. Patient is consuming 100% of all meals. He is also drinking Ensure Max twice daily. Intake is meeting nutrition needs to support healing.  Labs and medications reviewed.     Diet Order:   Diet Order            Diet - low sodium heart healthy        Diet Carb Modified Fluid consistency: Thin; Room service appropriate? Yes  Diet effective now              EDUCATION NEEDS:   Education needs have been addressed  Skin:  Skin Assessment: Skin Integrity Issues: Skin Integrity Issues:: Incisions Incisions: L ankle wound dehiscence S/P I&D; VAC in place  Last BM:  12/23  Height:   Ht Readings from Last 1 Encounters:  05/26/18 5' 11.5" (1.816 m)    Weight:   Wt Readings from Last 1 Encounters:  05/26/18 73 kg    Ideal Body Weight:  79.5 kg  BMI:  Body mass index is 22.14 kg/m.  Estimated Nutritional Needs:   Kcal:  2100-2300  Protein:  100-120 gm  Fluid:  2.1-2.3 L    Molli Barrows, RD, LDN, CNSC Pager [phone] After Hours Pager [phone]

## 2018-06-07 ENCOUNTER — Telehealth (INDEPENDENT_AMBULATORY_CARE_PROVIDER_SITE_OTHER): Payer: Self-pay | Admitting: Orthopedic Surgery

## 2018-06-07 NOTE — Telephone Encounter (Signed)
Neysa Bonitohristy, RN with [facility] called to advise that they were taking care of Mr. Szabo.  Patient has follow up appointment with our office on Monday 06/11/2018, so they will come out and see patient on Tuesday 06/12/2018 to draw labs per protocol and address vascular access device.

## 2018-06-11 ENCOUNTER — Encounter (INDEPENDENT_AMBULATORY_CARE_PROVIDER_SITE_OTHER): Payer: Self-pay | Admitting: Physician Assistant

## 2018-06-11 ENCOUNTER — Telehealth (INDEPENDENT_AMBULATORY_CARE_PROVIDER_SITE_OTHER): Payer: Self-pay

## 2018-06-11 ENCOUNTER — Ambulatory Visit (INDEPENDENT_AMBULATORY_CARE_PROVIDER_SITE_OTHER): Payer: Medicaid Other | Admitting: Physician Assistant

## 2018-06-11 DIAGNOSIS — Z945 Skin transplant status: Secondary | ICD-10-CM

## 2018-06-11 DIAGNOSIS — S91002D Unspecified open wound, left ankle, subsequent encounter: Secondary | ICD-10-CM

## 2018-06-11 MED ORDER — MUPIROCIN 2 % EX OINT: 1.0000 "application " | TOPICAL_OINTMENT | Freq: Every day | CUTANEOUS | 0 refills | Status: DC

## 2018-06-11 NOTE — Telephone Encounter (Signed)
Christy, HHN with [facility] would like a call back concerning any changes in plan of care for patient.  Cb# is [phone].  Please advise.  Thank you.

## 2018-06-11 NOTE — Progress Notes (Signed)
Office Visit Note   Patient: Cesar Ali           Date of Birth: 1966-04-19           MRN: [id number] Visit Date: 06/11/2018              Requested by: Corine ShelterKilpatrick, George, MD [address] [place], [name] [id number] PCP: Corine ShelterKilpatrick, George, MD  No chief complaint on file.     HPI: The patient is a 52 yo gentleman seen for post operative follow up following repeat I&D of left ankle wound with exposed fibula and acute osteomyelitis in the face of an ankle fracture on 06/01/18. Dr. Lajoyce Cornersuda applied Acell powder and applied a STSG, allograft the the area and VAC placement and the incision was closed.  He is on IV antibiotics, Ceftaroline through 07/11/2018. He is on Plavix.  He reports he [facility] off after several days due to pain from feeling the VAC sock on his leg.  [place] and they are doing his IV antibiotics.  Assessment & Plan: Visit Diagnoses:  1. S/P split thickness skin graft   2. Open wound of left ankle, subsequent encounter     Plan: Instructed the patient to use some Bactroban ointment to the incisional areas daily.  Orders were sent for home health care to see the patient at least twice weekly to monitor the wound and also assist with dressing changes.  He should continue to use the fracture boot and walk with crutches.  He will follow-up here in 2 weeks.  Follow-Up Instructions: Return in about 1 week (around 06/18/2018).   Ortho Exam  Patient is alert, oriented, no adenopathy, well-dressed, normal affect, normal respiratory effort. The left lower leg incisions are intact and without significant drainage.  There is no signs of cellulitis or other signs of infection.  He has good pedal pulses.  Imaging: No results found. No images are attached to the encounter.  Labs: Lab Results  Component Value Date   HGBA1C 11.7 (H) 05/30/2018   HGBA1C 13.3 07/14/2010   ESRSEDRATE 40 (H) 05/28/2018   ESRSEDRATE 50 (H) 05/26/2018   CRP <0.8 05/28/2018   CRP 1.0 (H) 05/26/2018     Lab Results  Component Value Date   ALBUMIN 4.3 07/20/2010    There is no height or weight on file to calculate BMI.  Orders:  No orders of the defined types were placed in this encounter.  Meds ordered this encounter  Medications  . mupirocin ointment (BACTROBAN) 2 %    Sig: Apply 1 application topically daily.    Dispense:  22 g    Refill:  0    Apply to incisions daily and cover with gauze and Ace wrap     Procedures: No procedures performed  Clinical Data: No additional findings.  ROS:  All other systems negative, except as noted in the HPI. Review of Systems  Objective: Vital Signs: There were no vitals taken for this visit.  Specialty Comments:  No specialty comments available.  PMFS History: Patient Active Problem List   Diagnosis Date Noted  . Protein-calorie malnutrition, severe 06/04/2018  . Open wound of left ankle   . Acute osteomyelitis of left fibula (HCC) 05/26/2018  . HYPERKALEMIA 07/28/2010  . ANEMIA 07/28/2010  . IDDM 07/14/2010  . HYPERLIPIDEMIA 07/14/2010  . HYPERTENSION 07/14/2010  . Chronic kidney disease 07/14/2010  . CVA WITH RIGHT HEMIPARESIS 09/09/2008  Past Medical History:  Diagnosis Date  . Acute osteomyelitis of left fibula (HCC) 05/26/2018  . Ankle fracture, left   . Anticoagulant long-term use    plavix  . CKD (chronic kidney disease), stage III [facility](HCC)    nephrologist-- dr Hyman Hopeswebb  . Full dentures   . History of CVA with residual deficit 2010   x3  march 2010;  sept 2010;  oct 2010---  residual right side weakness, per pt does not need cane  . Hyperlipidemia   . Hypertension   . Insulin dependent type 1 diabetes mellitus (HCC)    followed by pcp-- dr Greggory Stalliongeorge kilpatrick  . Stroke [facility](HCC)     History reviewed. No pertinent family history.  Past Surgical History:  Procedure Laterality Date  . CLOSED REDUCTION FINGER WITH PERCUTANEOUS  PINNING Right 2003   left ring finger  . HARDWARE REMOVAL Left 05/26/2018   Procedure: HARDWARE REMOVAL, LEFT ANKLE;  Surgeon: Teryl LucyLandau, Joshua, MD;  Location: [facility] OR;  Service: Orthopedics;  Laterality: Left;  . I&D EXTREMITY Left 05/26/2018   Procedure: IRRIGATION AND DEBRIDEMENT, LEFT ANKLE;  Surgeon: Teryl LucyLandau, Joshua, MD;  Location: [facility] OR;  Service: Orthopedics;  Laterality: Left;  . I&D EXTREMITY Left 05/30/2018   Procedure: IRRIGATION AND DEBRIDEMENT LEFT ANKLE,  WOUND VAC EXCHANGE;  Surgeon: Sheral ApleyMurphy, Timothy D, MD;  Location: [facility] OR;  Service: Orthopedics;  Laterality: Left;  . I&D EXTREMITY Left 06/01/2018   Procedure: REPEAT IRRIGATION AND DEBRIDEMENT [facility],WOUND VAC AND SKIN GRAFT;  Surgeon: Nadara Mustarduda, Marcus V, MD;  [facility];  Service: Orthopedics;  Laterality: Left;  . IR FLUORO GUIDE CV LINE RIGHT  05/28/2018  . IR US GUIDE VASC ACCESS RIGHT  05/28/2018  . MULTIPLE TOOTH EXTRACTIONS  03-22-2011   dr Teola Bradleyjenson @[facility]   . ORIF ANKLE FRACTURE Left 04/03/2018   Procedure: OPEN REDUCTION INTERNAL FIXATION (ORIF)  LEFT ANKLE FRACTURE;  Surgeon: Sheral ApleyMurphy, Timothy D, MD;  [facility] [place];  Service: Orthopedics;  Laterality: Left;  . PARS PLANA VITRECTOMY  02/01/2012   Procedure: PARS PLANA VITRECTOMY WITH 23 GAUGE;  Surgeon: Shade FloodGreer Geiger, MD;  [facility];  Service: Ophthalmology;  Laterality: Left;   Social History   Occupational History  . Not on file  Tobacco Use  . Smoking status: Former Smoker    Years: 10.00    Types: Cigarettes    Last attempt to quit: 03/30/2003    Years since quitting: 15.2  . Smokeless tobacco: Never Used  Substance and Sexual Activity  . Alcohol use: Not Currently  . Drug use: Not Currently    Comment: 03-29-2018 per pt last used cocaine 2004  . Sexual activity: Not on file

## 2018-06-12 ENCOUNTER — Telehealth (INDEPENDENT_AMBULATORY_CARE_PROVIDER_SITE_OTHER): Payer: Self-pay | Admitting: Orthopedic Surgery

## 2018-06-12 NOTE — Telephone Encounter (Signed)
Called and sw Christy to advise of below.  Said that she would only be able to see the pt once a week that she did not have approval from insurance but would speak to her manager about this because pt does not have the family support to do the dressings themselves at home. Will call with any other questions

## 2018-06-12 NOTE — Telephone Encounter (Signed)
Will call to advise can wash with soap and water but holding until after instructions for dressing changes are given.

## 2018-06-12 NOTE — Telephone Encounter (Signed)
Please see message below and advise. What dressing would you like for them to apply or are we doing weekly dressings on this pt?

## 2018-06-12 NOTE — Telephone Encounter (Signed)
Christy from [facility] called wanting to know if the patent can clean the area with soap and water or saline.  CB#[phone].  Thank you.

## 2018-06-12 NOTE — Telephone Encounter (Signed)
See previous message

## 2018-06-12 NOTE — Telephone Encounter (Signed)
Bactroban ointment to the incisional areas daily and apply gauze and Ace wraps and if [facility] seeing, she can do 2-3 times weekly .

## 2018-06-12 NOTE — Telephone Encounter (Signed)
Brenda  [facility]  [phone]  Verbal orders  For wound care admissions/ Face to Face

## 2018-06-14 ENCOUNTER — Other Ambulatory Visit (INDEPENDENT_AMBULATORY_CARE_PROVIDER_SITE_OTHER): Payer: Self-pay

## 2018-06-14 NOTE — Telephone Encounter (Signed)
Steward Drone from [facility] called back to fu on the Verbal Orders for Wound Care Admissions/Face to Face.  CB#[phone].  Thank you.

## 2018-06-14 NOTE — Telephone Encounter (Signed)
Order and last office visit note faxed to [phone] as requested by nursing to provide wound care to pt 2-3 times a week.

## 2018-06-15 ENCOUNTER — Telehealth (INDEPENDENT_AMBULATORY_CARE_PROVIDER_SITE_OTHER): Payer: Self-pay | Admitting: Orthopedic Surgery

## 2018-06-15 NOTE — Telephone Encounter (Signed)
I called and sw Steward Drone and advised that I had already faxed this information yesterday and discussed and she requested that I fax it to another number [phone]. This was faxed and asked her to call me if this was not received with in the next 5 min.

## 2018-06-15 NOTE — Telephone Encounter (Signed)
Message Sent In Error   °

## 2018-06-15 NOTE — Telephone Encounter (Signed)
Can you please print this and fax the last office visit note and the order ( can use the stamp on it) and fax to this ew number they are requesting. This is the phone call from this morning.

## 2018-06-15 NOTE — Telephone Encounter (Signed)
Done

## 2018-06-15 NOTE — Telephone Encounter (Signed)
Cesar Ali with [facility] left a message stating that she did not get the fax from you and wanted to give you another fax number #[phone].  She did not leave a CB#.  Thank you.

## 2018-06-15 NOTE — Telephone Encounter (Signed)
Brenda  Copy of wound care order   Copy of last progress note for Face to Face   Referral admitting patient to [facility] for wound care

## 2018-06-21 ENCOUNTER — Telehealth (INDEPENDENT_AMBULATORY_CARE_PROVIDER_SITE_OTHER): Payer: Self-pay | Admitting: Orthopedic Surgery

## 2018-06-21 NOTE — Telephone Encounter (Signed)
Christy from [facility] called stated has several assesment issues to discuss about patients left ankle.

## 2018-06-21 NOTE — Telephone Encounter (Signed)
Called and sw Christy and she states that the distal end of the pt's surgical site has dehis and that he has an appt next week fo follow up di not think it was something that needed to be seen sooner but also wanted to advise that there is a wound care center in [place] and that this is about 10 min from the pt's house if at his next visit Dr. Lajoyce Corners wanted to discuss transfer of care so that he does not have to drive so far.

## 2018-06-28 ENCOUNTER — Encounter (INDEPENDENT_AMBULATORY_CARE_PROVIDER_SITE_OTHER): Payer: Self-pay | Admitting: Orthopedic Surgery

## 2018-06-28 ENCOUNTER — Ambulatory Visit (INDEPENDENT_AMBULATORY_CARE_PROVIDER_SITE_OTHER): Payer: Medicaid Other | Admitting: Physician Assistant

## 2018-06-28 ENCOUNTER — Ambulatory Visit (INDEPENDENT_AMBULATORY_CARE_PROVIDER_SITE_OTHER): Payer: Medicaid Other

## 2018-06-28 VITALS — Ht 71.5 in | Wt 161.0 lb

## 2018-06-28 DIAGNOSIS — S82892S Other fracture of left lower leg, sequela: Secondary | ICD-10-CM | POA: Diagnosis not present

## 2018-06-28 NOTE — Progress Notes (Signed)
Office Visit Note   Patient: Cesar Ali           Date of Birth: Oct 31, 1965           MRN: [id number] Visit Date: 06/28/2018              Requested by: Corine Shelter, MD [address] [place], [place] [address] PCP: Corine Shelter, MD  Chief Complaint  Patient presents with  . Left Ankle - Routine Post Op      HPI: The patient is a 53 year old gentleman who is here with his brother for postoperative follow-up following a repeat I&D of his left ankle wound with exposed fibula and acute osteomyelitis in the face of a ankle fracture on 06/01/2018.  He had ACell powder as well as a allograft split thickness skin graft applied to the incisional area and the incision was closed.  He continues on IV ceftriaxone through 07/11/2018.  He is also on Plavix.  We have been maintaining him in a fracture boot but he reports that the fracture boot is rubbing the incisional area and causing some drainage.  Home health is been using some Kerlix and Covan to help keep the area covered and keep it from rubbing over the site.  Assessment & Plan: Visit Diagnoses:  1. Closed left ankle fracture, sequela     Plan: Staples and sutures were removed today from the incisional area.  A larger but lower profile fracture boot was obtained for the patient and will try to see if this works better as far as pressure over the incisional area.  He can wash the area daily with Dial soap and water and apply Bactroban ointment to the incisional area gauze and an ABD pad if needed and then Ace wrapping.  He will follow-up here in 3 weeks or sooner should he have difficulties in the interim.  Follow-Up Instructions: Return in about 3 weeks (around 07/19/2018).   Ortho Exam  Patient is alert, oriented, no adenopathy, well-dressed, normal affect, normal respiratory effort. The incisional area has some scant serosanguineous drainage from the distal incision.  Staples and sutures were removed from the area  today and the incision remains intact proximally but distally has some mild dehiscence with graft visible.  Necrotic graft was debrided from the area.  There is no signs of infection or cellulitis.  He has good pedal pulses.  We will continue local wound care with Bactroban and covering the area with clean gauze and Ace wrapping daily.  Imaging: No results found.   Labs: Lab Results  Component Value Date   HGBA1C 11.7 (H) 05/30/2018   HGBA1C 13.3 07/14/2010   ESRSEDRATE 40 (H) 05/28/2018   ESRSEDRATE 50 (H) 05/26/2018   CRP <0.8 05/28/2018   CRP 1.0 (H) 05/26/2018     Lab Results  Component Value Date   ALBUMIN 4.3 07/20/2010    Body mass index is 22.14 kg/m.  Orders:  Orders Placed This Encounter  Procedures  . XR Ankle Complete Left   No orders of the defined types were placed in this encounter.    Procedures: No procedures performed  Clinical Data: No additional findings.  ROS:  All other systems negative, except as noted in the HPI. Review of Systems  Objective: Vital Signs: Ht 5' 11.5" (1.816 m)   Wt 161 lb (73 kg)   BMI 22.14 kg/m   Specialty Comments:  No specialty comments available.  PMFS History: Patient Active Problem List   Diagnosis Date  Noted  . Protein-calorie malnutrition, severe 06/04/2018  . Open wound of left ankle   . Acute osteomyelitis of left fibula (HCC) 05/26/2018  . HYPERKALEMIA 07/28/2010  . ANEMIA 07/28/2010  . IDDM 07/14/2010  . HYPERLIPIDEMIA 07/14/2010  . HYPERTENSION 07/14/2010  . Chronic kidney disease 07/14/2010  . CVA WITH RIGHT HEMIPARESIS 09/09/2008   Past Medical History:  Diagnosis Date  . Acute osteomyelitis of left fibula (HCC) 05/26/2018  . Ankle fracture, left   . Anticoagulant long-term use    plavix  . CKD (chronic kidney disease), stage III [facility](HCC)    nephrologist-- dr Hyman Hopeswebb  . Full dentures   . History of CVA with residual deficit 2010   x3  march 2010;  sept 2010;  oct 2010---  residual right  side weakness, per pt does not need cane  . Hyperlipidemia   . Hypertension   . Insulin dependent type 1 diabetes mellitus (HCC)    followed by pcp-- dr Greggory Stalliongeorge kilpatrick  . Stroke [facility](HCC)     History reviewed. No pertinent family history.  Past Surgical History:  Procedure Laterality Date  . CLOSED REDUCTION FINGER WITH PERCUTANEOUS PINNING Right 2003   left ring finger  . HARDWARE REMOVAL Left 05/26/2018   Procedure: HARDWARE REMOVAL, LEFT ANKLE;  Surgeon: Teryl LucyLandau, Joshua, MD;  Location: [facility] OR;  Service: Orthopedics;  Laterality: Left;  . I&D EXTREMITY Left 05/26/2018   Procedure: IRRIGATION AND DEBRIDEMENT, LEFT ANKLE;  Surgeon: Teryl LucyLandau, Joshua, MD;  Location: [facility] OR;  Service: Orthopedics;  Laterality: Left;  . I&D EXTREMITY Left 05/30/2018   Procedure: IRRIGATION AND DEBRIDEMENT LEFT ANKLE,  WOUND VAC EXCHANGE;  Surgeon: Sheral ApleyMurphy, Timothy D, MD;  [facility]: [facility] OR;  Service: Orthopedics;  Laterality: Left;  . I&D EXTREMITY Left 06/01/2018   Procedure: REPEAT IRRIGATION AND [facility]BRIDEMENT [facility]OUND [facility]AC AND SKIN GRAFT;  Surgeon: Nadara Mustarduda, Marcus V, MD;  [facility];  Service: Orthopedics;  Laterality: Left;  . IR FLUORO GUIDE CV LINE RIGHT  05/28/2018  . IR US GUIDE VASC ACCESS RIGHT  05/28/2018  . MULTIPLE TOOTH EXTRACTIONS  03-22-2011   dr Teola Bradleyjenson @[facility]   . ORIF ANKLE FRACTURE Left 04/03/2018   Procedure: OPEN REDUCTION INTERNAL FIXATION (ORIF)  LEFT ANKLE FRACTURE;  Surgeon: Sheral ApleyMurphy, Timothy D, MD;  [facility] [place];  Service: Orthopedics;  Laterality: Left;  . PARS PLANA VITRECTOMY  02/01/2012   Procedure: PARS PLANA VITRECTOMY WITH 23 GAUGE;  Surgeon: Shade FloodGreer Geiger, MD;  [facility];  Service: Ophthalmology;  Laterality: Left;   Social History   Occupational History  . Not on file  Tobacco Use  . Smoking status: Former Smoker    Years: 10.00    Types: Cigarettes    Last attempt to quit: 03/30/2003    Years since quitting: 15.2  . Smokeless tobacco: Never Used    Substance and Sexual Activity  . Alcohol use: Not Currently  . Drug use: Not Currently    Comment: 03-29-2018 per pt last used cocaine 2004  . Sexual activity: Not on file

## 2018-06-29 ENCOUNTER — Telehealth (INDEPENDENT_AMBULATORY_CARE_PROVIDER_SITE_OTHER): Payer: Self-pay | Admitting: Orthopedic Surgery

## 2018-06-29 ENCOUNTER — Encounter (INDEPENDENT_AMBULATORY_CARE_PROVIDER_SITE_OTHER): Payer: Self-pay | Admitting: Physician Assistant

## 2018-06-29 NOTE — Telephone Encounter (Signed)
I called and lm on vm to advise that the pt was in the office yesterday and we removed his staples and stiches and that he was advised to use Bactroban ointment to the incisional area and cover with gauze and and ace bandage daily. If they can not go out daily then to go three times a week and teach a support person in the home to change on the days they are not going out to see the pt . To call with any questions.

## 2018-06-29 NOTE — Telephone Encounter (Signed)
Christy-nurse with [facility] called needing wound care orders faxed to her. She advised patient was seen yesterday. The fax# is [phone]  The phone# is [phone]

## 2018-07-02 ENCOUNTER — Telehealth (INDEPENDENT_AMBULATORY_CARE_PROVIDER_SITE_OTHER): Payer: Self-pay | Admitting: Orthopedic Surgery

## 2018-07-02 ENCOUNTER — Telehealth (INDEPENDENT_AMBULATORY_CARE_PROVIDER_SITE_OTHER): Payer: Self-pay

## 2018-07-02 NOTE — Telephone Encounter (Signed)
Cesar Ali could not get vascular device blood draw but had to get it venipuncture. FYI  I advised her that it was okay to harvest sutures and staples. She understood VO.

## 2018-07-02 NOTE — Telephone Encounter (Signed)
Cesar Ali could not get vascular device blood draw but had to get it venipuncture. FYI  I advised her that it was okay to harvest sutures and staples. She understood VO. 

## 2018-07-02 NOTE — Telephone Encounter (Signed)
Christy with [facility] called to make sure that on the medial aspect of his ankle the sutures were not removed at his last appointment but the lateral ones were removed expect for one of them was remaining.  She did not know if that was something that the provider wanted to do   If you would like she can remove them with a VO

## 2018-07-06 ENCOUNTER — Telehealth (INDEPENDENT_AMBULATORY_CARE_PROVIDER_SITE_OTHER): Payer: Self-pay | Admitting: Orthopedic Surgery

## 2018-07-06 ENCOUNTER — Other Ambulatory Visit (INDEPENDENT_AMBULATORY_CARE_PROVIDER_SITE_OTHER): Payer: Self-pay

## 2018-07-06 MED ORDER — MUPIROCIN 2 % EX OINT: 1.0000 "application " | TOPICAL_OINTMENT | Freq: Every day | CUTANEOUS | 0 refills | Status: AC

## 2018-07-06 NOTE — Telephone Encounter (Signed)
Cesar Ali with [facility] left message to state she removed the remaining sutures & staples from the surgical site. Also patient is low on the bactroban and will need a new Rx called to pharmcay.

## 2018-07-06 NOTE — Telephone Encounter (Signed)
rx was sent to pharmacy ok per Dr. Lajoyce Corners

## 2018-07-09 ENCOUNTER — Inpatient Hospital Stay: Payer: Medicaid Other | Admitting: Family

## 2018-07-16 ENCOUNTER — Telehealth (INDEPENDENT_AMBULATORY_CARE_PROVIDER_SITE_OTHER): Payer: Self-pay | Admitting: Orthopedic Surgery

## 2018-07-16 NOTE — Telephone Encounter (Signed)
Dictation from the office visit 06/28/2018 has been faxed.

## 2018-07-16 NOTE — Telephone Encounter (Signed)
Kaitlyn-nurse with  [facility] called needing office note for DOS 06/28/2018 faxed to her for a face to face with Dr. Lajoyce Corners. The fax# is [phone]   The phone # is [phone]

## 2018-07-16 NOTE — Telephone Encounter (Signed)
Misty Stanley with [facility] request a call back @ [phone]. Misty Stanley wants to know if patient need labs drawn today since he has stopped the IV infusion last week. Misty Stanley also states patient has an appointment in office this week.

## 2018-07-17 ENCOUNTER — Encounter: Payer: Self-pay | Admitting: Infectious Diseases

## 2018-07-17 ENCOUNTER — Encounter (INDEPENDENT_AMBULATORY_CARE_PROVIDER_SITE_OTHER): Payer: Self-pay | Admitting: Physician Assistant

## 2018-07-17 ENCOUNTER — Ambulatory Visit (HOSPITAL_COMMUNITY)
Admission: RE | Admit: 2018-07-17 | Discharge: 2018-07-17 | Disposition: A | Discharge status: Home / Self Care | Payer: Medicaid Other | Source: Ambulatory Visit | Attending: Infectious Diseases | Admitting: Infectious Diseases

## 2018-07-17 ENCOUNTER — Ambulatory Visit (INDEPENDENT_AMBULATORY_CARE_PROVIDER_SITE_OTHER): Payer: Medicaid Other | Admitting: Infectious Diseases

## 2018-07-17 ENCOUNTER — Ambulatory Visit (INDEPENDENT_AMBULATORY_CARE_PROVIDER_SITE_OTHER): Payer: Medicaid Other | Admitting: Physician Assistant

## 2018-07-17 VITALS — Ht 71.5 in | Wt 161.0 lb

## 2018-07-17 VITALS — BP 128/85 | HR 88 | Temp 98.3°F | Wt 168.2 lb

## 2018-07-17 DIAGNOSIS — Z452 Encounter for adjustment and management of vascular access device: Secondary | ICD-10-CM | POA: Diagnosis present

## 2018-07-17 DIAGNOSIS — M86162 Other acute osteomyelitis, left tibia and fibula: Secondary | ICD-10-CM

## 2018-07-17 DIAGNOSIS — S82892S Other fracture of left lower leg, sequela: Secondary | ICD-10-CM

## 2018-07-17 DIAGNOSIS — Z945 Skin transplant status: Secondary | ICD-10-CM

## 2018-07-17 DIAGNOSIS — Z95828 Presence of other vascular implants and grafts: Secondary | ICD-10-CM

## 2018-07-17 HISTORY — PX: IR REMOVAL TUN CV CATH W/O FL: IMG2289

## 2018-07-17 MED ORDER — LIDOCAINE HCL 1 % IJ SOLN
INTRAMUSCULAR | Status: AC
  Filled 2018-07-17: qty 20

## 2018-07-17 MED ORDER — CEPHALEXIN 500 MG PO CAPS: 500.0000 mg | ORAL_CAPSULE | Freq: Four times a day (QID) | ORAL | 0 refills | Status: AC

## 2018-07-17 MED ORDER — CHLORHEXIDINE GLUCONATE 4 % EX LIQD
CUTANEOUS | Status: AC
  Filled 2018-07-17: qty 15

## 2018-07-17 NOTE — Assessment & Plan Note (Signed)
Has completed 6 weeks IV therapy. Site is unremarkable and well maintained. Will see if we can coordinate with IR today for removal vs contacting local hospital to have this done.

## 2018-07-17 NOTE — Progress Notes (Signed)
Office Visit Note   Patient: Cesar Ali           Date of Birth: 10-13-1965           MRN: [id number] Visit Date: 07/17/2018              Requested by: Corine Shelter, MD [address] [place], [place] [address] PCP: Corine Shelter, MD  Chief Complaint  Patient presents with  . Left Ankle - Routine Post Op    06/01/18 I&D       HPI: The patient is a 53 year old gentleman who is seen for postoperative follow-up following a repeat I&D of his left ankle wound with exposed fibula and acute osteomyelitis in the face of an ankle fracture on 06/01/2018.  He then had ACell powder as well as allograft split thickness skin graft applied to the incisional area.  He is continuing with home health nursing for dressing changes to the right lower leg. He reports he has completed his course of IV ceftriaxone and is following up with the infectious disease physician this afternoon to see if he needs further antibiotic therapy and for possible PICC line removal.  He reports he has been doing well at home and has been ambulating with his fracture boot as instructed.  Assessment & Plan: Visit Diagnoses:  1. S/P split thickness skin graft   2. Closed left ankle fracture, sequela     Plan: We will begin silver alginate dressings to the incisional area every Monday Wednesday Friday covered with gauze and then Ace wrapping.  He can continue to shower and wash the area with Dial soap on days of his dressing changes.  He will continue wearing his fracture boot as instructed.  He should follow-up here in 2 weeks or sooner should he have difficulties in the interim.  Follow-Up Instructions: Return in about 2 weeks (around 07/31/2018).   Ortho Exam  Patient is alert, oriented, no adenopathy, well-dressed, normal affect, normal respiratory effort. The left lateral leg incision has opened area with minimal serous appearing drainage.  The area is 7 x 1.5 x 0.5 cm.  There appears to be  residual graft partially in the area as well as some pink granulation.  There are no signs of periwound breakdown or irritation.  No signs of residual cellulitis or infection.  He has good palpable pedal pulses.    Imaging: No results found. No images are attached to the encounter.  Labs: Lab Results  Component Value Date   HGBA1C 11.7 (H) 05/30/2018   HGBA1C 13.3 07/14/2010   ESRSEDRATE 40 (H) 05/28/2018   ESRSEDRATE 50 (H) 05/26/2018   CRP <0.8 05/28/2018   CRP 1.0 (H) 05/26/2018     Lab Results  Component Value Date   ALBUMIN 4.3 07/20/2010    Body mass index is 22.14 kg/m.  Orders:  No orders of the defined types were placed in this encounter.  No orders of the defined types were placed in this encounter.    Procedures: No procedures performed  Clinical Data: No additional findings.  ROS:  All other systems negative, except as noted in the HPI. Review of Systems  Objective: Vital Signs: Ht 5' 11.5" (1.816 m)   Wt 161 lb (73 kg)   BMI 22.14 kg/m   Specialty Comments:  No specialty comments available.  PMFS History: Patient Active Problem List   Diagnosis Date Noted  . Protein-calorie malnutrition, severe 06/04/2018  . Open wound of left ankle   .  Acute osteomyelitis of left fibula (HCC) 05/26/2018  . HYPERKALEMIA 07/28/2010  . ANEMIA 07/28/2010  . IDDM 07/14/2010  . HYPERLIPIDEMIA 07/14/2010  . HYPERTENSION 07/14/2010  . Chronic kidney disease 07/14/2010  . CVA WITH RIGHT HEMIPARESIS 09/09/2008   Past Medical History:  Diagnosis Date  . Acute osteomyelitis of left fibula (HCC) 05/26/2018  . Ankle fracture, left   . Anticoagulant long-term use    plavix  . CKD (chronic kidney disease), stage III [facility])    nephrologist-- dr Hyman Hopes  . Full dentures   . History of CVA with residual deficit 2010   x3  march 2010;  sept 2010;  oct 2010---  residual right side weakness, per pt does not need cane  . Hyperlipidemia   . Hypertension   . Insulin  dependent type 1 diabetes mellitus (HCC)    followed by pcp-- dr Greggory Stallion kilpatrick  . Stroke [facility])     History reviewed. No pertinent family history.  Past Surgical History:  Procedure Laterality Date  . CLOSED REDUCTION FINGER WITH PERCUTANEOUS PINNING Right 2003   left ring finger  . HARDWARE REMOVAL Left 05/26/2018   Procedure: HARDWARE REMOVAL, LEFT ANKLE;  Surgeon: Teryl Lucy, MD;  Location: [facility] OR;  Service: Orthopedics;  Laterality: Left;  . I&D EXTREMITY Left 05/26/2018   Procedure: IRRIGATION AND DEBRIDEMENT, LEFT ANKLE;  Surgeon: Teryl Lucy, MD;  Location: [facility] OR;  Service: Orthopedics;  Laterality: Left;  . I&D EXTREMITY Left 05/30/2018   Procedure: IRRIGATION AND DEBRIDEMENT LEFT ANKLE,  WOUND VAC EXCHANGE;  Surgeon: Sheral Apley, MD;  Location: [facility] OR;  Service: Orthopedics;  Laterality: Left;  . I&D EXTREMITY Left 06/01/2018   Procedure: REPEAT IRRIGATION AND DEBRIDEMENT [facility] AND SKIN GRAFT;  Surgeon: Nadara Mustard, MD;  Location: [facility] OR;  Service: Orthopedics;  Laterality: Left;  . IR FLUORO GUIDE CV LINE RIGHT  05/28/2018  . IR US GUIDE VASC ACCESS RIGHT  05/28/2018  . MULTIPLE TOOTH EXTRACTIONS  03-22-2011   dr Teola Bradley @[facility]   . ORIF ANKLE FRACTURE Left 04/03/2018   Procedure: OPEN REDUCTION INTERNAL FIXATION (ORIF)  LEFT ANKLE FRACTURE;  Surgeon: Sheral Apley, MD;  Location: [facility];  Service: Orthopedics;  Laterality: Left;  . PARS PLANA VITRECTOMY  02/01/2012   Procedure: PARS PLANA VITRECTOMY WITH 23 GAUGE;  Surgeon: Shade Flood, MD;  Location: [facility] OR;  Service: Ophthalmology;  Laterality: Left;   Social History   Occupational History  . Not on file  Tobacco Use  . Smoking status: Former Smoker    Years: 10.00    Types: Cigarettes    Last attempt to quit: 03/30/2003    Years since quitting: 15.3  . Smokeless tobacco: Never Used  Substance and Sexual Activity  . Alcohol use: Not Currently  . Drug use: Not Currently     Comment: 03-29-2018 per pt last used cocaine 2004  . Sexual activity: Not on file

## 2018-07-17 NOTE — Assessment & Plan Note (Signed)
Acute/Chronic osteomyelitis associated with HW infection now s/p complete removal of HW and distal fibula and completed 42 days empiric antibiotic therapy with Ceftaroline. Will repeat labs today as his home health records are not available for review. His leg looks much improved compared to 3 weeks ago and healing in slowly. Will transition to PO cephalexin x 2 weeks until he follows up here with Tammy Sours or Dr. Drue Second to reassess need for continued therapy.

## 2018-07-17 NOTE — Telephone Encounter (Signed)
This pt was seen in the office this morning. Please see below and advise. Thanks!

## 2018-07-17 NOTE — Progress Notes (Signed)
Patient: Cesar Ali  DOB: 18-Apr-1966 MRN: [id number] PCP: Corine Shelter, MD  Referring Provider: hospital follow up osteomyelitis   Patient Active Problem List   Diagnosis Date Noted  . S/P PICC central line placement 07/17/2018  . Protein-calorie malnutrition, severe 06/04/2018  . Open wound of left ankle   . Acute osteomyelitis of left fibula (HCC) 05/26/2018  . HYPERKALEMIA 07/28/2010  . ANEMIA 07/28/2010  . IDDM 07/14/2010  . HYPERLIPIDEMIA 07/14/2010  . HYPERTENSION 07/14/2010  . Chronic kidney disease 07/14/2010  . CVA WITH RIGHT HEMIPARESIS 09/09/2008     Subjective:  CC:  Hospital follow up on left ankle osteomyelitis.    Brief ID Hx:  Cesar Ali is a 54 y.o. male admitted 05/26/18 with open wound on the left lateral ankle with exposed hardware/bone and acute osteomyelitis of the left ankle. He initially sustained closed trimalleolar fracture of the left ankle that required ORIF 04/03/18. He had trouble with post op wound drainage and disruption of surgical incision about 3 weeks later. Unfortunately despite wound care efforts his wound continued erode to where hardware was exposed. He was taken to ED at [facility] in December where Dr. Dion Saucier removed all hardware and debrided the site. He was started on antibiotics prior to surgery and intraop cultures and there was no growth. Initially started on Vanc/Cefepime but with CKD disease decided to continue him on Ceftaroline IV. He completed this January 29th.   HPI:  Cesar Ali is feeling well today. Reports that he has had close follow up with Dr. Lajoyce Corners and orthopedic team with regards to his wound. Acell powder and allograft split thickness skin graft was applied to incisional area. He is currently ambulating in a fracture boot. Recent X-rays indicate good alignment of fx with resected distal fibula d/t infection.   He saw them today and reported that his wound is healing nicely. He had no trouble with side  effects from antibiotics and still has tunneled PICC line in place now; site is without pain, drainage or erythema and is well maintained by [facility] Team. No swelling or altered sensation in affected distal extremity. No complaints today aside from desire to remove PICC line.   Review of Systems  Constitutional: Negative for chills, fever, malaise/fatigue and weight loss.  Respiratory: Negative for cough and sputum production.   Cardiovascular: Negative for chest pain and leg swelling.  Gastrointestinal: Negative for abdominal pain, diarrhea and vomiting.  Genitourinary: Negative for dysuria and flank pain.  Musculoskeletal:       Little/infrequent pain in the left ankle.   Skin: Negative for rash.  Neurological: Negative for dizziness.    Past Medical History:  Diagnosis Date  . Acute osteomyelitis of left fibula (HCC) 05/26/2018  . Ankle fracture, left   . Anticoagulant long-term use    plavix  . CKD (chronic kidney disease), stage III [facility])    nephrologist-- dr Hyman Hopes  . Full dentures   . History of CVA with residual deficit 2010   x3  march 2010;  sept 2010;  oct 2010---  residual right side weakness, per pt does not need cane  . Hyperlipidemia   . Hypertension   . Insulin dependent type 1 diabetes mellitus (HCC)    followed by pcp-- dr Greggory Stallion kilpatrick  . Stroke [facility])     Outpatient Medications Prior to Visit  Medication Sig Dispense Refill  . amLODipine (NORVASC) 10 MG tablet Take 10 mg by mouth every morning.     Marland Kitchen  aspirin EC 81 MG tablet Take 81 mg by mouth daily.    Marland Kitchen. atorvastatin (LIPITOR) 10 MG tablet Take 10 mg by mouth every morning.     . clopidogrel (PLAVIX) 75 MG tablet Take 75 mg by mouth every morning.     . furosemide (LASIX) 40 MG tablet Take 40 mg by mouth every morning.     . insulin regular (NOVOLIN R,HUMULIN R) 100 units/mL injection Inject into the skin 2 (two) times daily before a meal. Per sliding scale. 200-250= 2 units, 251-300= 4 units, 301-350=  6 units, 351-400= 8 units.     Marland Kitchen. LEVEMIR 100 UNIT/ML injection Inject 25 Units into the skin 2 (two) times daily.  4  . lisinopril (PRINIVIL,ZESTRIL) 10 MG tablet Take 10 mg by mouth every morning.     . methocarbamol (ROBAXIN) 500 MG tablet Take 1 tablet (500 mg total) by mouth every 8 (eight) hours as needed for muscle spasms. 40 tablet 0  . quinapril (ACCUPRIL) 10 MG tablet Take 10 mg by mouth every morning.     . gabapentin (NEURONTIN) 300 MG capsule Take 1 capsule (300 mg total) by mouth 2 (two) times daily for 14 days. For 2 weeks post op for pain. 28 capsule 0  . mupirocin ointment (BACTROBAN) 2 % Apply 1 application topically daily. (Patient not taking: Reported on 07/17/2018) 22 g 0  . ondansetron (ZOFRAN) 4 MG tablet Take 1 tablet (4 mg total) by mouth every 8 (eight) hours as needed for nausea or vomiting. (Patient not taking: Reported on 07/17/2018) 20 tablet 0  . oxyCODONE-acetaminophen (PERCOCET/ROXICET) 5-325 MG tablet Take 1 tablet by mouth every 4 (four) hours as needed. (Patient not taking: Reported on 07/17/2018) 30 tablet 0   No facility-administered medications prior to visit.      No Known Allergies  Social History   Tobacco Use  . Smoking status: Former Smoker    Years: 10.00    Types: Cigarettes    Last attempt to quit: 03/30/2003    Years since quitting: 15.3  . Smokeless tobacco: Never Used  Substance Use Topics  . Alcohol use: Not Currently  . Drug use: Not Currently    Comment: 03-29-2018 per pt last used cocaine 2004    No family history on file.  Objective:   Vitals:   07/17/18 1341  BP: 128/85  Pulse: 88  Temp: 98.3 F (36.8 C)  TempSrc: Oral  Weight: 168 lb 4 oz (76.3 kg)   Body mass index is 23.14 kg/m.  Physical Exam Constitutional:      Comments: Thin appearing male seated comfortably in chair today in no distress.   Cardiovascular:     Rate and Rhythm: Normal rate and regular rhythm.     Pulses: Normal pulses.  Pulmonary:     Effort:  Pulmonary effort is normal.     Breath sounds: Normal breath sounds.  Musculoskeletal:     Right lower leg: No edema.     Left lower leg: No edema.     Comments: Left lateral incision pictured below compared to 3 weeks ago.  Little serous drainage on silver alginate dressing applied today. Yellow wound bed s/p skin graft/ACell application. Periwound w/o signs of infection.   Skin:    Comments: Right tunneled PICC line - clean/dry dressing. Insertion site w/o erythema, tenderness, drainage, cording or distal swelling of affected extremity    Neurological:     Mental Status: He is alert.     07/17/18  06/28/18:      Lab Results: Lab Results  Component Value Date   WBC 7.1 05/28/2018   HGB 8.3 (L) 05/28/2018   HCT 27.0 (L) 05/28/2018   MCV 85.2 05/28/2018   PLT 391 05/28/2018    Lab Results  Component Value Date   CREATININE 1.44 (H) 06/05/2018   BUN 22 (H) 06/05/2018   NA 134 (L) 06/05/2018   K 4.9 06/05/2018   CL 96 (L) 06/05/2018   CO2 27 06/05/2018    Lab Results  Component Value Date   ALT 39 07/20/2010   AST 24 07/20/2010   ALKPHOS 101 07/20/2010   BILITOT 0.3 07/20/2010     Assessment & Plan:   Problem List Items Addressed This Visit      Unprioritized   Acute osteomyelitis of left fibula (HCC) - Primary    Acute/Chronic osteomyelitis associated with HW infection now s/p complete removal of HW and distal fibula and completed 42 days empiric antibiotic therapy with Ceftaroline. Will repeat labs today as his home health records are not available for review. His leg looks much improved compared to 3 weeks ago and healing in slowly. Will transition to PO cephalexin x 2 weeks until he follows up here with Tammy Sours or Dr. Drue Second to reassess need for continued therapy.       Relevant Medications   cephALEXin (KEFLEX) 500 MG capsule   Other Relevant Orders   C-reactive protein   Sedimentation rate   CBC   Basic metabolic panel   IR Removal Tun Cv Cath W/O FL  (Completed)   S/P PICC central line placement    Has completed 6 weeks IV therapy. Site is unremarkable and well maintained. Will see if we can coordinate with IR today for removal vs contacting local hospital to have this done.          Rexene Alberts, MSN, NP-C [facility] Pager: [phone]  07/17/18

## 2018-07-17 NOTE — Patient Instructions (Signed)
Will see if we can arrange to have your PICC line removed at [facility] as soon as possible.   Will have you start taking an antibiotic by mouth 4 times a day called cephalexin. Please take with food and let us know if you have trouble with diarrhea.   Would like to see you back on February 18th.

## 2018-07-17 NOTE — Telephone Encounter (Signed)
Orders faxed to [facility] for dressing changes with silver alginate to the wound every Mon-Wed-Fridays and cover with gauze and Ace wrapping.

## 2018-07-18 ENCOUNTER — Telehealth: Payer: Self-pay | Admitting: Behavioral Health

## 2018-07-18 LAB — BASIC METABOLIC PANEL
BUN/Creatinine Ratio: 12 (calc) (ref 6–22)
BUN: 23 mg/dL (ref 7–25)
CO2: 26 mmol/L (ref 20–32)
Calcium: 9.7 mg/dL (ref 8.6–10.3)
Chloride: 96 mmol/L — ABNORMAL LOW (ref 98–110)
Creat: 1.86 mg/dL — ABNORMAL HIGH (ref 0.70–1.33)
GLUCOSE: 502 mg/dL — AB (ref 65–99)
Potassium: 4.8 mmol/L (ref 3.5–5.3)
Sodium: 132 mmol/L — ABNORMAL LOW (ref 135–146)

## 2018-07-18 LAB — CBC
HCT: 37 % — ABNORMAL LOW (ref 38.5–50.0)
Hemoglobin: 11.7 g/dL — ABNORMAL LOW (ref 13.2–17.1)
MCH: 27 pg (ref 27.0–33.0)
MCHC: 31.6 g/dL — ABNORMAL LOW (ref 32.0–36.0)
MCV: 85.5 fL (ref 80.0–100.0)
MPV: 10.8 fL (ref 7.5–12.5)
PLATELETS: 366 10*3/uL (ref 140–400)
RBC: 4.33 10*6/uL (ref 4.20–5.80)
RDW: 14.5 % (ref 11.0–15.0)
WBC: 6.1 10*3/uL (ref 3.8–10.8)

## 2018-07-18 LAB — SEDIMENTATION RATE: Sed Rate: 6 mm/h (ref 0–20)

## 2018-07-18 LAB — C-REACTIVE PROTEIN: CRP: 4 mg/L (ref <8.0)

## 2018-07-18 NOTE — Telephone Encounter (Signed)
Called Cesar Ali, verified identity.  Patient states he is feeling good this morning. Informed him per Rexene Alberts that his glucose was 502 yesterday on the blood work taken while in the office.  Patient states it was 544 when he got back home to [facility] lastnight around 10pm.  Patient states at that time he took his insulin.  Patient states he had to drive over 3 hours to [facility] and 3.5 hours back he was drinking sodas to help him stay awake.  Patient states he checked his blood sugar this morning and it is down to 180.  Patient states he checks his blood sugars regularly and takes his medication as prescribed.  Patient was appreciative of the call.  Informed him if blood sugars get elevated and remain elevated despite intervention to go to the ER and make sure his PCP or doctor who manages his Diabetes is aware.  Patient verbalized understanding.  Patient states he had no complications after tunneled PICC removal yesterday 07/17/2018. Angeline Slim RN

## 2018-07-18 NOTE — Telephone Encounter (Signed)
Makes sense - he had to start driving around 6 am. Will talk with him about consideration of diet sodas next time. Glad he called back and they are back down.  Thank you!

## 2018-07-18 NOTE — Telephone Encounter (Signed)
Called Mr Acors left a voicemail for him to call the office back ASAP.  Did not leave results on the voicemail.  Attempted to call three times back to back, phone does not ring it goes straight to voicemail.  Will attempt again later today. Angeline Slim RN

## 2018-07-18 NOTE — Telephone Encounter (Signed)
-----   Message from Blanchard Kelch, NP sent at 07/18/2018  8:30 AM EST ----- Cesar Ali can you please call Cesar Ali to let him know that his blood sugar was 502. He has had very high blood sugars in the hospital and I am worried he doesn't have his diabetes medicines - can you call to see what's going on?  He was well appearing yesterday aside from being tired from driving.  No other severe electrolyte disturbance so I don't think he needs to go to ER as long as he has his diabetes meds / insulin and meter / supplies. If he doesn't have access to supplies/meds he probably needs to go to ER to get blood sugar down. At minimum he needs to contact his pcp or endocrinology team this week to discuss.  Thanks for your help.

## 2018-07-26 ENCOUNTER — Telehealth (INDEPENDENT_AMBULATORY_CARE_PROVIDER_SITE_OTHER): Payer: Self-pay | Admitting: Physician Assistant

## 2018-07-26 NOTE — Telephone Encounter (Signed)
Cesar Ali from [facility] left a message yesterday stating that the signed orders that were faxed back to her, she could not see the signature.  She requested that they be faxed directly to her at [phone], that will go directly into her email. Thank you.

## 2018-07-26 NOTE — Telephone Encounter (Signed)
Is there paper work that was faxed for this pt from this week that was maybe stamped? They are asking for it to be re faxed to a different number below.

## 2018-07-27 NOTE — Telephone Encounter (Signed)
refaxed as requested

## 2018-07-31 ENCOUNTER — Ambulatory Visit (INDEPENDENT_AMBULATORY_CARE_PROVIDER_SITE_OTHER): Payer: Medicaid Other | Admitting: Physician Assistant

## 2018-07-31 ENCOUNTER — Encounter (INDEPENDENT_AMBULATORY_CARE_PROVIDER_SITE_OTHER): Payer: Self-pay | Admitting: Physician Assistant

## 2018-07-31 ENCOUNTER — Ambulatory Visit (INDEPENDENT_AMBULATORY_CARE_PROVIDER_SITE_OTHER): Payer: Medicaid Other | Admitting: Family

## 2018-07-31 ENCOUNTER — Encounter: Payer: Self-pay | Admitting: Family

## 2018-07-31 VITALS — BP 133/90 | HR 88 | Temp 98.0°F | Ht 72.0 in | Wt 170.0 lb

## 2018-07-31 VITALS — Ht 71.5 in | Wt 168.2 lb

## 2018-07-31 DIAGNOSIS — M86162 Other acute osteomyelitis, left tibia and fibula: Secondary | ICD-10-CM | POA: Diagnosis not present

## 2018-07-31 DIAGNOSIS — Z945 Skin transplant status: Secondary | ICD-10-CM

## 2018-07-31 DIAGNOSIS — S91002D Unspecified open wound, left ankle, subsequent encounter: Secondary | ICD-10-CM

## 2018-07-31 DIAGNOSIS — S82892S Other fracture of left lower leg, sequela: Secondary | ICD-10-CM

## 2018-07-31 NOTE — Assessment & Plan Note (Signed)
Cesar Ali has osteomyelitis of the left fibula and has completed 6 weeks of IV therapy and is currently on oral cephalexin with good adherence and tolerance. Wound continues to heal slowly. Blood sugars are not optimal, however improved from previous. Discussed importance of controlling blood sugars and decreasing carbohydrates with adequate protein intake. Previous inflammatory markers look good. Continue current dose of Keflex for 2 weeks and follow up in 3 weeks for wound check.

## 2018-07-31 NOTE — Progress Notes (Signed)
Subjective:    Patient ID: Cesar Ali, male    DOB: 12-02-1965, 53 y.o.   MRN: [id number]  Chief Complaint  Patient presents with  . Follow-up    Osteomyelitis of left fibula     HPI:  Cesar Ali is a 53 y.o. male who presents today for follow up of osteomyelitis of the left lower leg.   Mr. Schedler was last seen in the office on 07/17/18 at the completion of 6 weeks of IV therapy for acute osteomyelitis that was found to be culture negative and initially treated with Vancomycin and Cefepime and changed to Ceftaroline. Wound was healing well at the time and was changed to oral cephalexin. Inflammatory markers were normal (CRP 4 / ESR 6). Continues to be seen for wound care by orthopedics with his most recent office visit being this morning.  Mr. Allsbrook has been taking his cephalexin as prescribed with no missed doses or adverse side effects. Wound continues to heal without evidence of infection. No fevers, chills, or sweats. Blood sugars have bene improved ranging from 170-200. He is working on decreasing his soda intake. 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    No Known Allergies    Outpatient Medications Prior to Visit  Medication Sig Dispense Refill  . amLODipine (NORVASC) 10 MG tablet Take 10 mg by mouth every morning.     Marland Kitchen aspirin EC 81 MG tablet Take 81 mg by mouth daily.    Marland Kitchen atorvastatin (LIPITOR) 10 MG tablet Take 10 mg by mouth every morning.     . cephALEXin (KEFLEX) 500 MG capsule Take 1 capsule (500 mg total) by mouth 4 (four) times daily for 21 days. 84 capsule 0  . clopidogrel (PLAVIX) 75 MG tablet Take 75 mg by mouth every morning.     . furosemide (LASIX) 40 MG tablet Take 40 mg by mouth every morning.     . insulin regular (NOVOLIN R,HUMULIN R) 100 units/mL injection Inject into the skin 2 (two) times daily before a meal. Per sliding scale. 200-250= 2 units, 251-300= 4 units, 301-350= 6 units, 351-400= 8 units.     Marland Kitchen  LEVEMIR 100 UNIT/ML injection Inject 25 Units into the skin 2 (two) times daily.  4  . lisinopril (PRINIVIL,ZESTRIL) 10 MG tablet Take 10 mg by mouth every morning.     . methocarbamol (ROBAXIN) 500 MG tablet Take 1 tablet (500 mg total) by mouth every 8 (eight) hours as needed for muscle spasms. 40 tablet 0  . mupirocin ointment (BACTROBAN) 2 % Apply 1 application topically daily. 22 g 0  . ondansetron (ZOFRAN) 4 MG tablet Take 1 tablet (4 mg total) by mouth every 8 (eight) hours as needed for nausea or vomiting. 20 tablet 0  . oxyCODONE-acetaminophen (PERCOCET/ROXICET) 5-325 MG tablet Take 1 tablet by mouth every 4 (four) hours as needed. 30 tablet 0  . quinapril (ACCUPRIL) 10 MG tablet Take 10 mg by mouth every morning.     . gabapentin (NEURONTIN) 300 MG capsule Take 1 capsule (300 mg total) by mouth 2 (two) times daily for 14 days. For 2 weeks post op for pain. 28 capsule 0   No facility-administered medications prior to visit.      Past Medical History:  Diagnosis Date  . Acute osteomyelitis of left fibula ([place]) 05/26/2018  . Ankle fracture, left   . Anticoagulant long-term use    plavix  . CKD (chronic kidney disease), stage III (  [facility])    nephrologist-- dr Justin Mend  . Full dentures   . History of CVA with residual deficit 2010   x3  march 2010;  sept 2010;  oct 2010---  residual right side weakness, per pt does not need cane  . Hyperlipidemia   . Hypertension   . Insulin dependent type 1 diabetes mellitus ([place])    followed by pcp-- dr Iona Beard kilpatrick  . Stroke [facility])      Past Surgical History:  Procedure Laterality Date  . CLOSED REDUCTION FINGER WITH PERCUTANEOUS PINNING Right 2003   left ring finger  . HARDWARE REMOVAL Left 05/26/2018   Procedure: HARDWARE REMOVAL, LEFT ANKLE;  Surgeon: Marchia Bond, MD;  Location: [place];  Service: Orthopedics;  Laterality: Left;  . I&D EXTREMITY Left 05/26/2018   Procedure: IRRIGATION AND DEBRIDEMENT, LEFT ANKLE;  Surgeon: Marchia Bond, MD;  Location: [place];  Service: Orthopedics;  Laterality: Left;  . I&D EXTREMITY Left 05/30/2018   Procedure: IRRIGATION AND DEBRIDEMENT LEFT ANKLE,  WOUND VAC EXCHANGE;  Surgeon: Renette Butters, MD;  Location: [place];  Service: Orthopedics;  Laterality: Left;  . I&D EXTREMITY Left 06/01/2018   Procedure: REPEAT IRRIGATION AND DEBRIDEMENT [facility] AND SKIN GRAFT;  Surgeon: Newt Minion, MD;  Location: [place];  Service: Orthopedics;  Laterality: Left;  . IR FLUORO GUIDE CV LINE RIGHT  05/28/2018  . IR REMOVAL TUN CV CATH W/O FL  07/17/2018  . IR US GUIDE VASC ACCESS RIGHT  05/28/2018  . MULTIPLE TOOTH EXTRACTIONS  03-22-2011   dr Stefanie Libel _0   . ORIF ANKLE FRACTURE Left 04/03/2018   Procedure: OPEN REDUCTION INTERNAL FIXATION (ORIF)  LEFT ANKLE FRACTURE;  Surgeon: Renette Butters, MD;  Location: [place];  Service: Orthopedics;  Laterality: Left;  . PARS PLANA VITRECTOMY  02/01/2012   Procedure: PARS PLANA VITRECTOMY WITH 23 GAUGE;  Surgeon: Adonis Brook, MD;  Location: [place];  Service: Ophthalmology;  Laterality: Left;       Review of Systems  Constitutional: Negative for chills, fatigue and fever.  Respiratory: Negative for chest tightness, shortness of breath and wheezing.   Cardiovascular: Negative for chest pain and leg swelling.  Gastrointestinal: Negative for constipation, diarrhea, nausea and vomiting.  Skin: Positive for wound.  Neurological: Positive for numbness. Negative for weakness.      Objective:    BP 133/90   Pulse 88   Temp 98 F (36.7 C)   Ht 6' (1.829 m)   Wt 170 lb (77.1 kg)   BMI 23.06 kg/m  Nursing note and vital signs reviewed.  Physical Exam Constitutional:      General: He is not in acute distress.    Appearance: He is well-developed. He is ill-appearing.  Cardiovascular:     Rate and Rhythm: Normal rate and regular rhythm.     Heart sounds: Normal heart sounds.  Pulmonary:     Effort: Pulmonary effort is  normal.     Breath sounds: Normal breath sounds.  Skin:    General: Skin is warm and dry.     Comments: Left lower extremity wound appears with increased amount of granulation tissue and slightly smaller than previous. There is serous drainage present. No odor or induration.   Neurological:     Mental Status: He is alert.        Assessment & Plan:   Problem List Items Addressed This Visit      Musculoskeletal and Integument   Acute osteomyelitis of left  fibula [facility]) - Primary    Mr. Deman has osteomyelitis of the left fibula and has completed 6 weeks of IV therapy and is currently on oral cephalexin with good adherence and tolerance. Wound continues to heal slowly. Blood sugars are not optimal, however improved from previous. Discussed importance of controlling blood sugars and decreasing carbohydrates with adequate protein intake. Previous inflammatory markers look good. Continue current dose of Keflex for 2 weeks and follow up in 3 weeks for wound check.           I am having Lyndon Code maintain his clopidogrel, lisinopril, atorvastatin, furosemide, amLODipine, insulin regular, aspirin EC, quinapril, gabapentin, ondansetron, methocarbamol, LEVEMIR, oxyCODONE-acetaminophen, mupirocin ointment, and cephALEXin.   Follow-up: Return in about 3 weeks (around 08/21/2018), or if symptoms worsen or fail to improve.   Terri Piedra, MSN, FNP-C Nurse Practitioner [facility] Office phone: [phone] Pager: [place] number: [phone]

## 2018-07-31 NOTE — Progress Notes (Signed)
Office Visit Note   Patient: Cesar Ali           Date of Birth: [birth year]           MRN: [id number] Visit Date: 07/31/2018              Requested by: Corine Shelter, MD [address] [place], [place] [address] PCP: Corine Shelter, MD  Chief Complaint  Patient presents with  . Left Ankle - Routine Post Op    Repeat I&D 06/01/18 Left ankle      HPI: The patient is a 53 year old gentleman who is seen for postoperative follow-up following repeat I&D of his left ankle wound with exposed fibula and acute osteomyelitis in the face of an ankle fracture on 06/01/2018.  He then had ACell powder as well as cadaver allograft split thickness skin graft applied to the incisional area.  He he has continued to heal well.  He completed a course of IV ceftriaxone.  He has been receiving home health care and they have been utilizing silver alginate to the wound bed and he is changing the dressing every other day.  Home health is been coming once a week.  He is continued to ambulate in his fracture boot and reports no pain over the area.  He does report that he has been walking some without the boot with no pain.  Assessment & Plan: Visit Diagnoses:  1. S/P split thickness skin graft   2. Closed left ankle fracture, sequela   3. Open wound of left ankle, subsequent encounter     Plan: Continue silver alginate dressings to the residual wound bed after showering with Dial soap and water every other day.  Home health can continue to see him once weekly for this dressing.  He may weight-bear as tolerated and begin wearing a regular shoe.  He will follow-up here in 4 weeks.  Follow-Up Instructions: Return in about 4 weeks (around 08/28/2018).   Ortho Exam  Patient is alert, oriented, no adenopathy, well-dressed, normal affect, normal respiratory effort. The graft over the left lateral ankle incision has incorporated well.  There are no signs of infection or cellulitis.  He has good  pedal pulses.  He is slightly contracted in his ankle range of motion and he is going to work on this and was instructed to do range of motion exercises.  Imaging: No results found.   Labs: Lab Results  Component Value Date   HGBA1C 11.7 (H) 05/30/2018   HGBA1C 13.3 07/14/2010   ESRSEDRATE 6 07/17/2018   ESRSEDRATE 40 (H) 05/28/2018   ESRSEDRATE 50 (H) 05/26/2018   CRP 4.0 07/17/2018   CRP <0.8 05/28/2018   CRP 1.0 (H) 05/26/2018     Lab Results  Component Value Date   ALBUMIN 4.3 07/20/2010    Body mass index is 23.14 kg/m.  Orders:  No orders of the defined types were placed in this encounter.  No orders of the defined types were placed in this encounter.    Procedures: No procedures performed  Clinical Data: No additional findings.  ROS:  All other systems negative, except as noted in the HPI. Review of Systems  Objective: Vital Signs: Ht 5' 11.5" (1.816 m)   Wt 168 lb 4 oz (76.3 kg)   BMI 23.14 kg/m   Specialty Comments:  No specialty comments available.  PMFS History: Patient Active Problem List   Diagnosis Date Noted  . S/P PICC central line placement 07/17/2018  . Protein-calorie  malnutrition, severe 06/04/2018  . Open wound of left ankle   . Acute osteomyelitis of left fibula (H[facility]) 05/26/2018  . HYPERKALEMIA 07/28/2010  . ANEMIA 07/28/2010  . IDDM 07/14/2010  . HYPERLIPIDEMIA 07/14/2010  . HYPERTENSION 07/14/2010  . Chronic kidney disease 07/14/2010  . CVA WITH RIGHT HEMIPARESIS 09/09/2008   Past Medical History:  Diagnosis Date  . Acute osteomyelitis of left fibula (H[facility]) 05/26/2018  . Ankle fracture, left   . Anticoagulant long-term use    plavix  . CKD (chronic kidney disease), stage III [facility])    nephrologist-- dr Hyman Hopes  . Full dentures   . History of CVA with residual deficit 2010   x3  march 2010;  sept 2010;  oct 2010---  residual right side weakness, per pt does not need cane  . Hyperlipidemia   . Hypertension   . Insulin  dependent type 1 diabetes mellitus (H[facility])    followed by pcp-- dr Greggory Stallion kilpatrick  . Stroke [facility])     History reviewed. No pertinent family history.  Past Surgical History:  Procedure Laterality Date  . CLOSED REDUCTION FINGER WITH PERCUTANEOUS PINNING Right 2003   left ring finger  . HARDWARE REMOVAL Left 05/26/2018   Procedure: HARDWARE REMOVAL, LEFT ANKLE;  Surgeon: Teryl Lucy, MD;  [facility];  Service: Orthopedics;  Laterality: Left;  . I&D EXTREMITY Left 05/26/2018   Procedure: IRRIGATION AND DEBRIDEMENT, LEFT ANKLE;  Surgeon: Teryl Lucy, MD;  Location: [facility];  Service: Orthopedics;  Laterality: Left;  . I&D EXTREMITY Left 05/30/2018   Procedure: IRRIGATION AND DEBRIDEMENT LEFT ANKLE,  WOUND VAC EXCHANGE;  Surgeon: Sheral Apley, MD;  Location: [facility];  Service: Orthopedics;  Laterality: Left;  . I&D EXTREMITY Left 06/01/2018   Procedure: REPEAT IRRIGATION AND DEBRIDEMENT [facility] AND SKIN GRAFT;  Surgeon: Nadara Mustard, MD;  Location: [facility] OR;  Service: Orthopedics;  Laterality: Left;  . IR FLUORO GUIDE CV LINE RIGHT  05/28/2018  . IR REMOVAL TUN CV CATH W/O FL  07/17/2018  . IR US GUIDE VASC ACCESS RIGHT  05/28/2018  . MULTIPLE TOOTH EXTRACTIONS  03-22-2011   dr Teola Bradley @[facility]   . ORIF ANKLE FRACTURE Left 04/03/2018   Procedure: OPEN REDUCTION INTERNAL FIXATION (ORIF)  LEFT ANKLE FRACTURE;  Surgeon: Sheral Apley, MD;  Location: [facility] [place];  Service: Orthopedics;  Laterality: Left;  . PARS PLANA VITRECTOMY  02/01/2012   Procedure: PARS PLANA VITRECTOMY WITH 23 GAUGE;  Surgeon: Shade Flood, MD;  Location[facility];  Service: Ophthalmology;  Laterality: Left;   Social History   Occupational History  . Not on file  Tobacco Use  . Smoking status: Former Smoker    Years: 10.00    Types: Cigarettes    Last attempt to quit: 03/30/2003    Years since quitting: 15.3  . Smokeless tobacco: Never Used  Substance and Sexual Activity  . Alcohol use:  Not Currently  . Drug use: Not Currently    Comment: 03-29-2018 per pt last used cocaine 2004  . Sexual activity: Not on file

## 2018-07-31 NOTE — Patient Instructions (Signed)
Nice to meet you.  Your wound is looking good.  Continue to take the Keflex until completed.  Continue wound care per orthopedics.  Good job controlling the blood sugars.  Hopefully back to mowing soon!  Follow up with Cesar Ali in 3 weeks or sooner if needed.

## 2018-08-14 ENCOUNTER — Telehealth (INDEPENDENT_AMBULATORY_CARE_PROVIDER_SITE_OTHER): Payer: Self-pay | Admitting: Orthopedic Surgery

## 2018-08-14 NOTE — Telephone Encounter (Signed)
Misty Stanley from [facility] left a message stating that they are suspending all home health services and that the patient needs a referral to a [facility] agency of his choice.  [facility] CB#[phone].  Thank you.

## 2018-08-15 ENCOUNTER — Telehealth (INDEPENDENT_AMBULATORY_CARE_PROVIDER_SITE_OTHER): Payer: Self-pay

## 2018-08-15 NOTE — Telephone Encounter (Signed)
I called and lvm for patient callback on services for home health.If patient calls please get home health services of his choice contact information so he can continue to receive wound care needs.

## 2018-08-15 NOTE — Telephone Encounter (Signed)
Patient returned call and said the agency is closing and that they suppose to be giving him another agency he can continue his services and will update our office when they contact him. I advised patient to continue to keep appts and we will see him in our office.

## 2018-08-15 NOTE — Telephone Encounter (Signed)
I called to clarify or be informed to why patient services was being suspended. Lvm for return call.

## 2018-08-16 ENCOUNTER — Telehealth (INDEPENDENT_AMBULATORY_CARE_PROVIDER_SITE_OTHER): Payer: Self-pay | Admitting: Family

## 2018-08-16 NOTE — Telephone Encounter (Signed)
Patient called needing to know the name of the healthcare facility in [place]. The number to contact patient is [phone]

## 2018-08-17 ENCOUNTER — Telehealth (INDEPENDENT_AMBULATORY_CARE_PROVIDER_SITE_OTHER): Payer: Self-pay

## 2018-08-17 NOTE — Telephone Encounter (Signed)
Patient was called and informed that he can seek any agency of his preference and we can try and set-up further treatment with that source if he qualifies for their services. I informed patient that if he finds an agency to contact us with the information and we can go from there. It was suggested to him some agencies such as [facility], [employer]; etc.

## 2018-08-21 ENCOUNTER — Ambulatory Visit: Payer: Medicaid Other | Admitting: Infectious Diseases

## 2018-08-29 ENCOUNTER — Ambulatory Visit (INDEPENDENT_AMBULATORY_CARE_PROVIDER_SITE_OTHER): Payer: Medicaid Other | Admitting: Family

## 2018-09-05 ENCOUNTER — Ambulatory Visit (INDEPENDENT_AMBULATORY_CARE_PROVIDER_SITE_OTHER): Payer: Medicaid Other | Admitting: Family

## 2018-12-08 IMAGING — US US EXTREM LOW DUPLEX ARTERIAL BILAT
1 series · 13 of 25 positions shown · non-contrast
Comparison: None.

CLINICAL DATA: 52-year-old male with a history of nonhealing ulcer.

EXAM:
NONINVASIVE PHYSIOLOGIC VASCULAR STUDY OF BILATERAL LOWER
EXTREMITIES
TECHNIQUE: Evaluation of both lower extremities was performed at rest,
including calculation of ankle-brachial indices, multiple segmental
pressure evaluation, segmental Doppler and segmental pulse volume
recording, as well as directed duplex.

[Series 1: us extrem low duplex arterial bilat · 0.06mm/px · 13 of 64 slices shown]
[im 1/64]
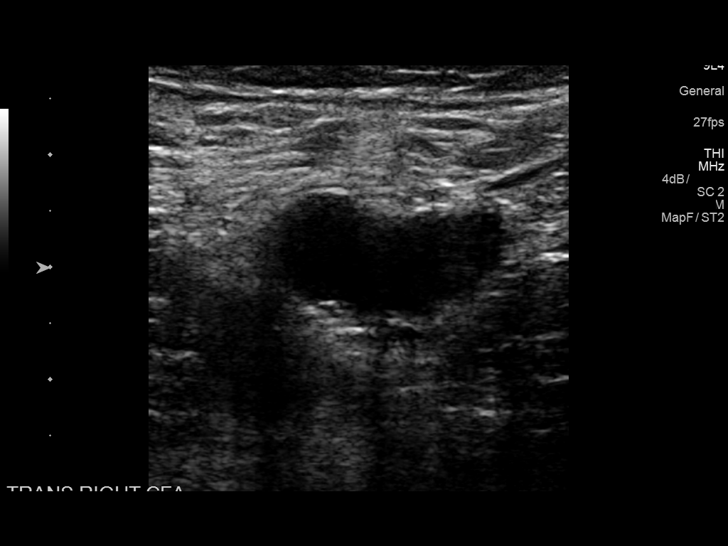
[im 6/64]
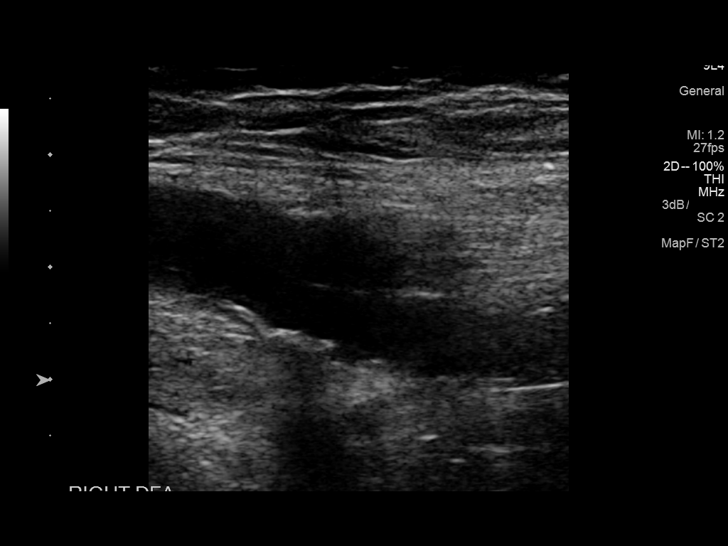
[im 11/64]
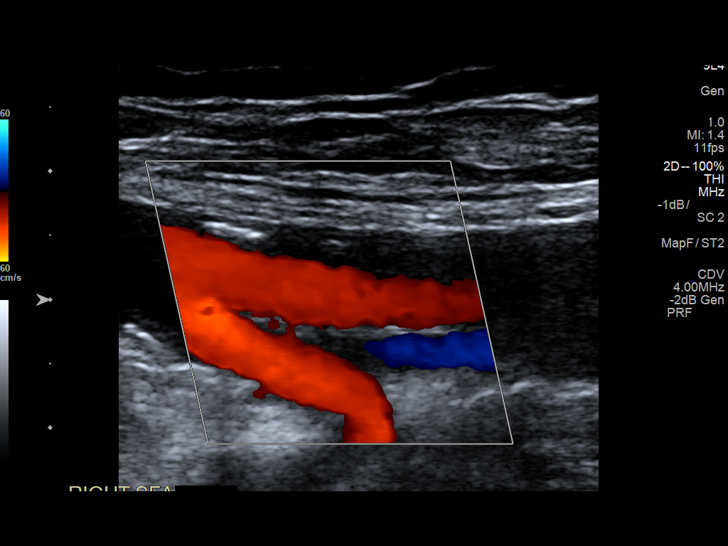
[im 16/64]
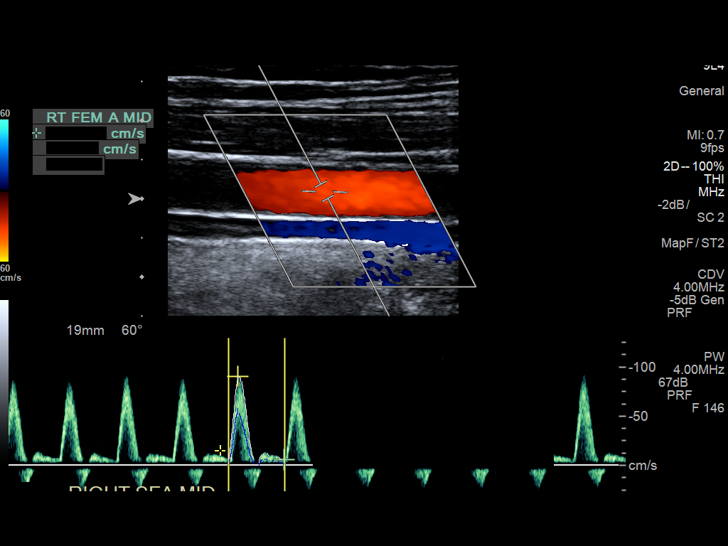
[im 22/64]
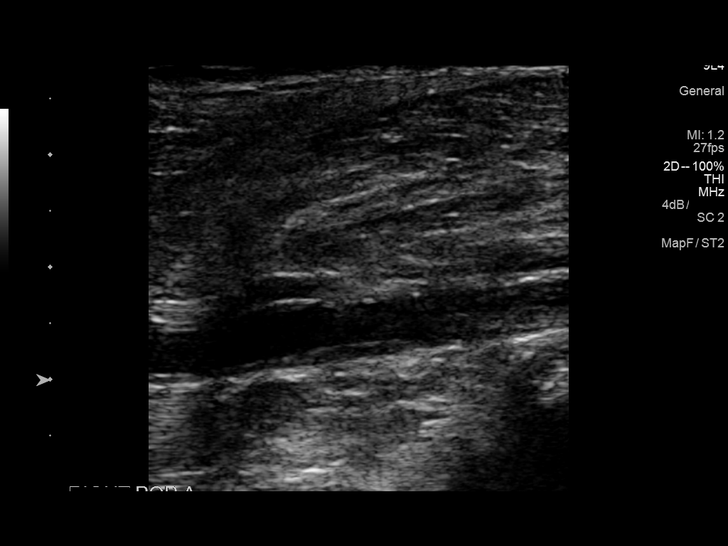
[im 27/64]
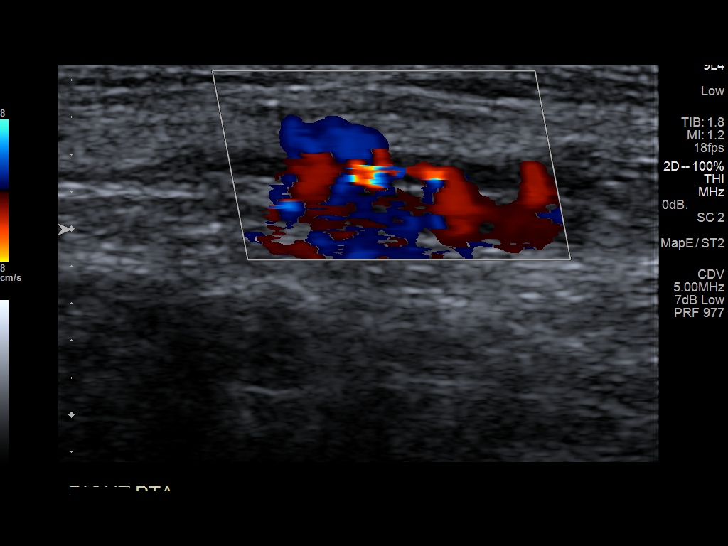
[im 32/64]
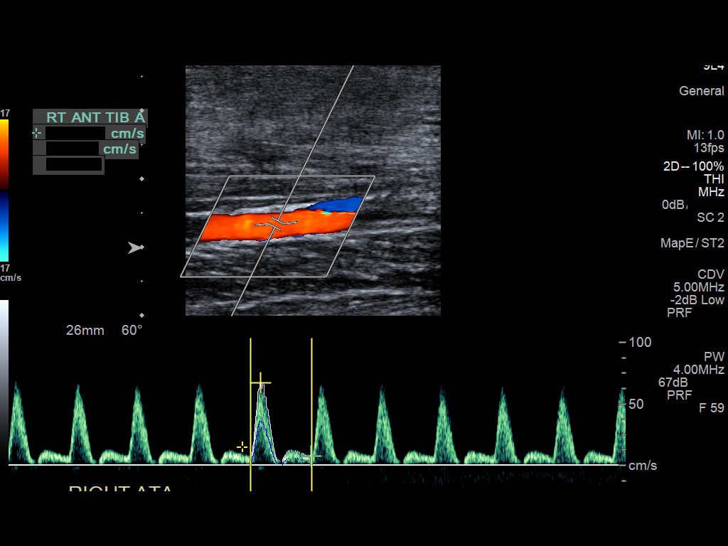
[im 37/64]
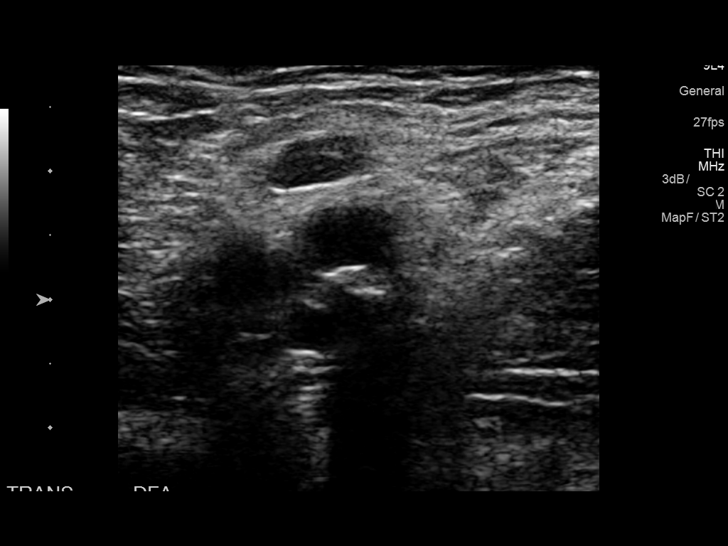
[im 43/64]
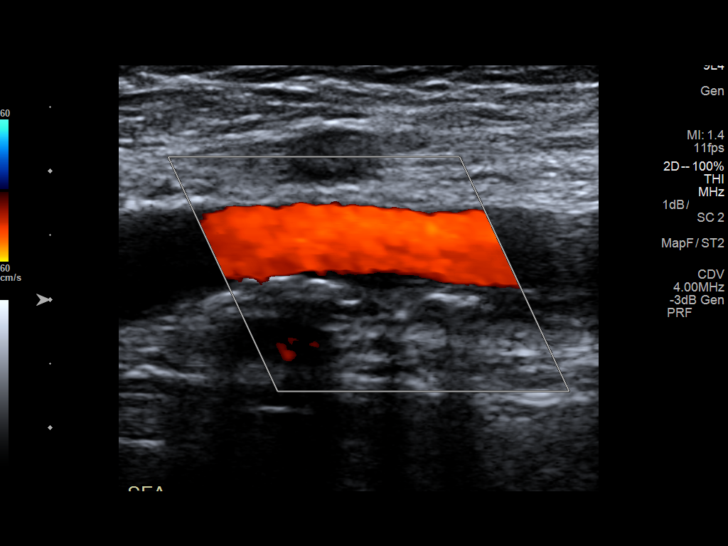
[im 48/64]
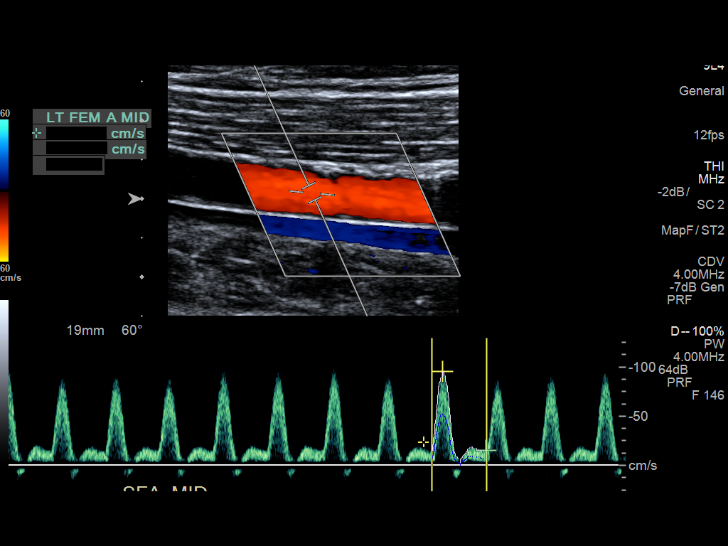
[im 53/64]
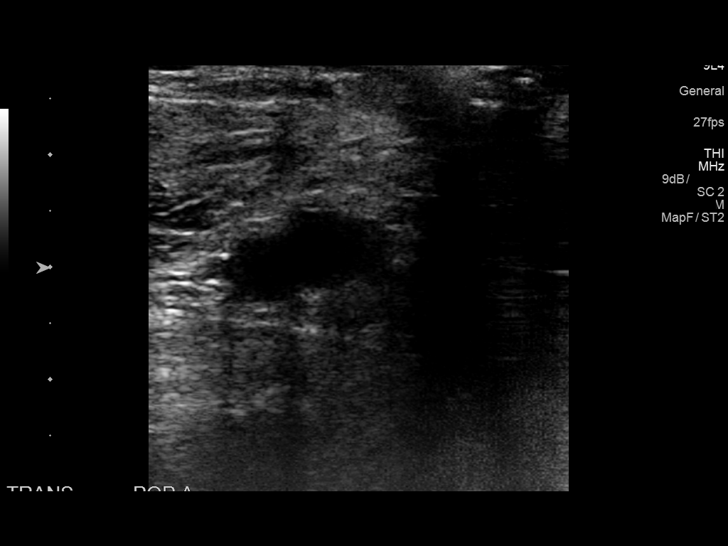
[im 58/64]
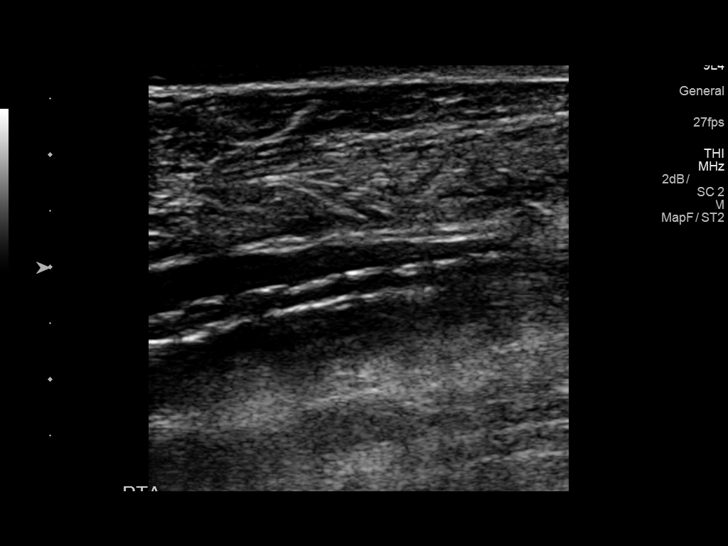
[im 64/64]
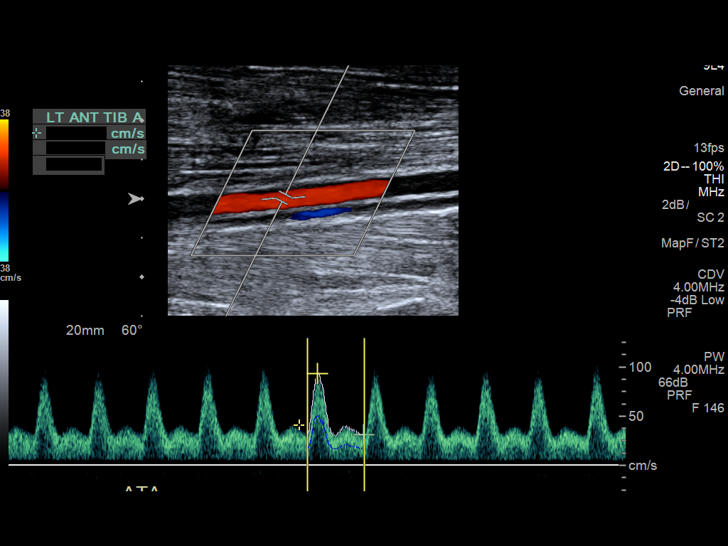

[13 of 25 positions shown; findings below may reference images not displayed]

FINDINGS: Right ABI:

Left ABI:

Right Lower Extremity: Segmental Doppler of the right lower
extremity demonstrates monophasic waveform of the dorsalis pedis and
posterior tibial.

Left Lower Extremity: Segmental Doppler of the left lower extremity
demonstrates monophasic waveform of the dorsalis pedis and posterior
tibial arteries.

Directed duplex of the right lower extremity demonstrates triphasic
common femoral artery, superficial femoral artery, popliteal artery,
with monophasic posterior tibial artery and biphasic anterior tibial
artery.

Directed duplex of the left lower extremity demonstrates triphasic
common femoral artery, profunda femoris, SFA, popliteal artery.
Biphasic waveform of the posterior tibial artery and anterior tibial
artery at the ankle.
IMPRESSION: Resting ABI the bilateral lower extremities within normal limits.

Segmental exam and directed duplex demonstrates evidence of
developing tibial disease.

## 2019-01-22 IMAGING — DX DG ANKLE COMPLETE 3+V*L*
3 series · 3 of 3 positions shown · non-contrast
Comparison: None.

CLINICAL DATA: Slipped with ankle swelling

EXAM:
LEFT ANKLE COMPLETE - 3+ VIEW

[ankle ap]
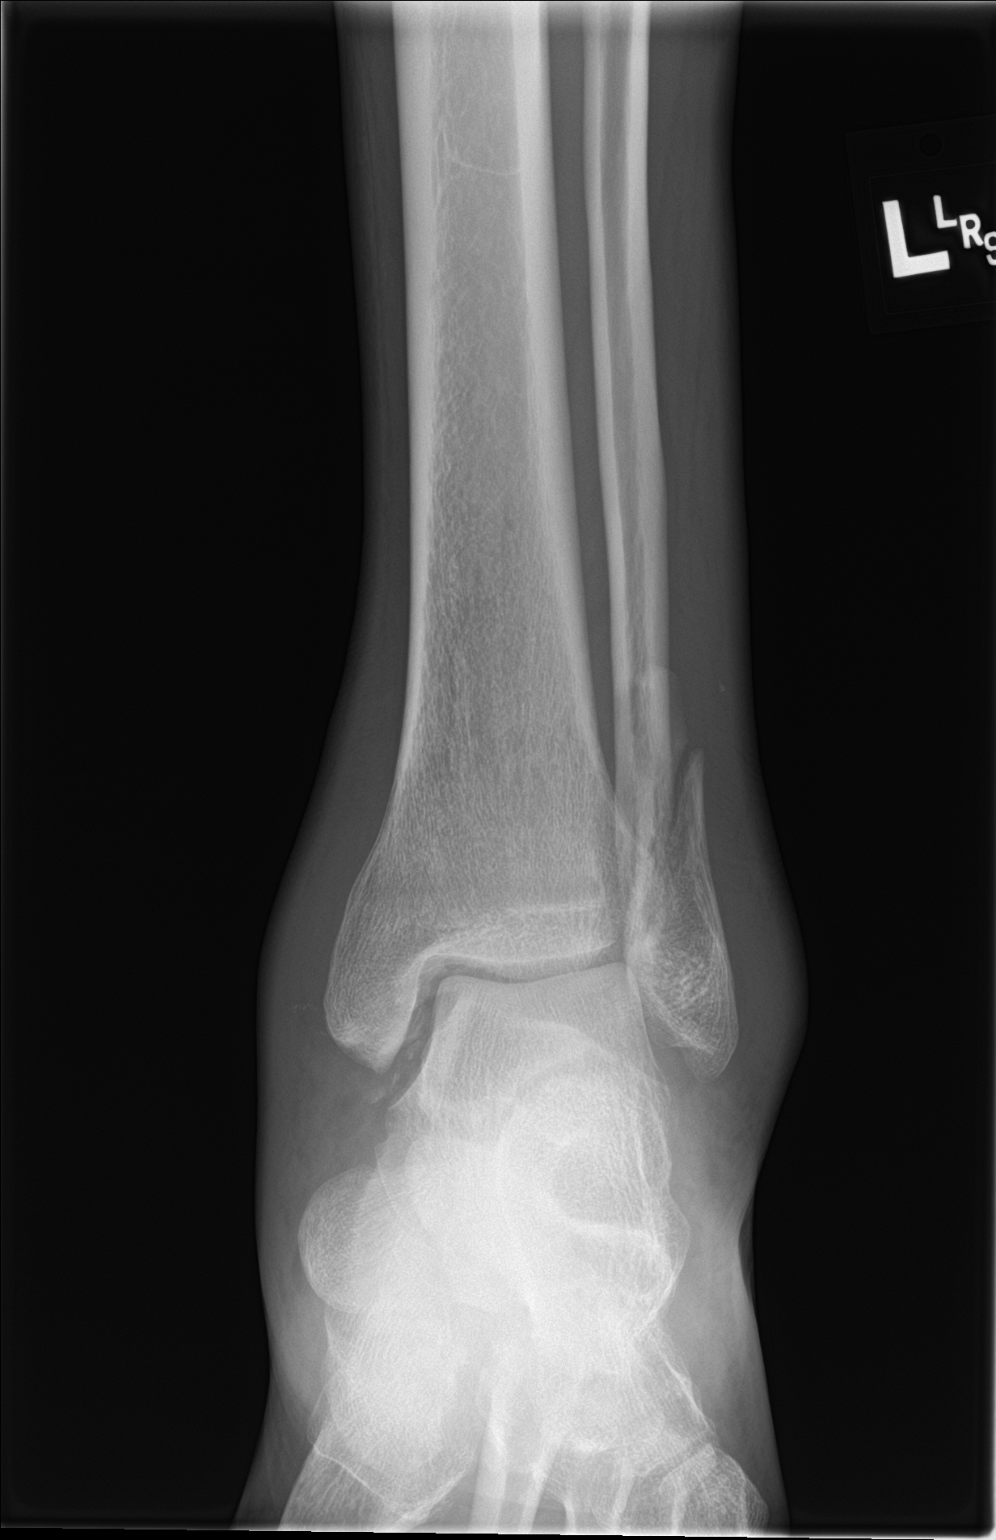

[ankle obl]
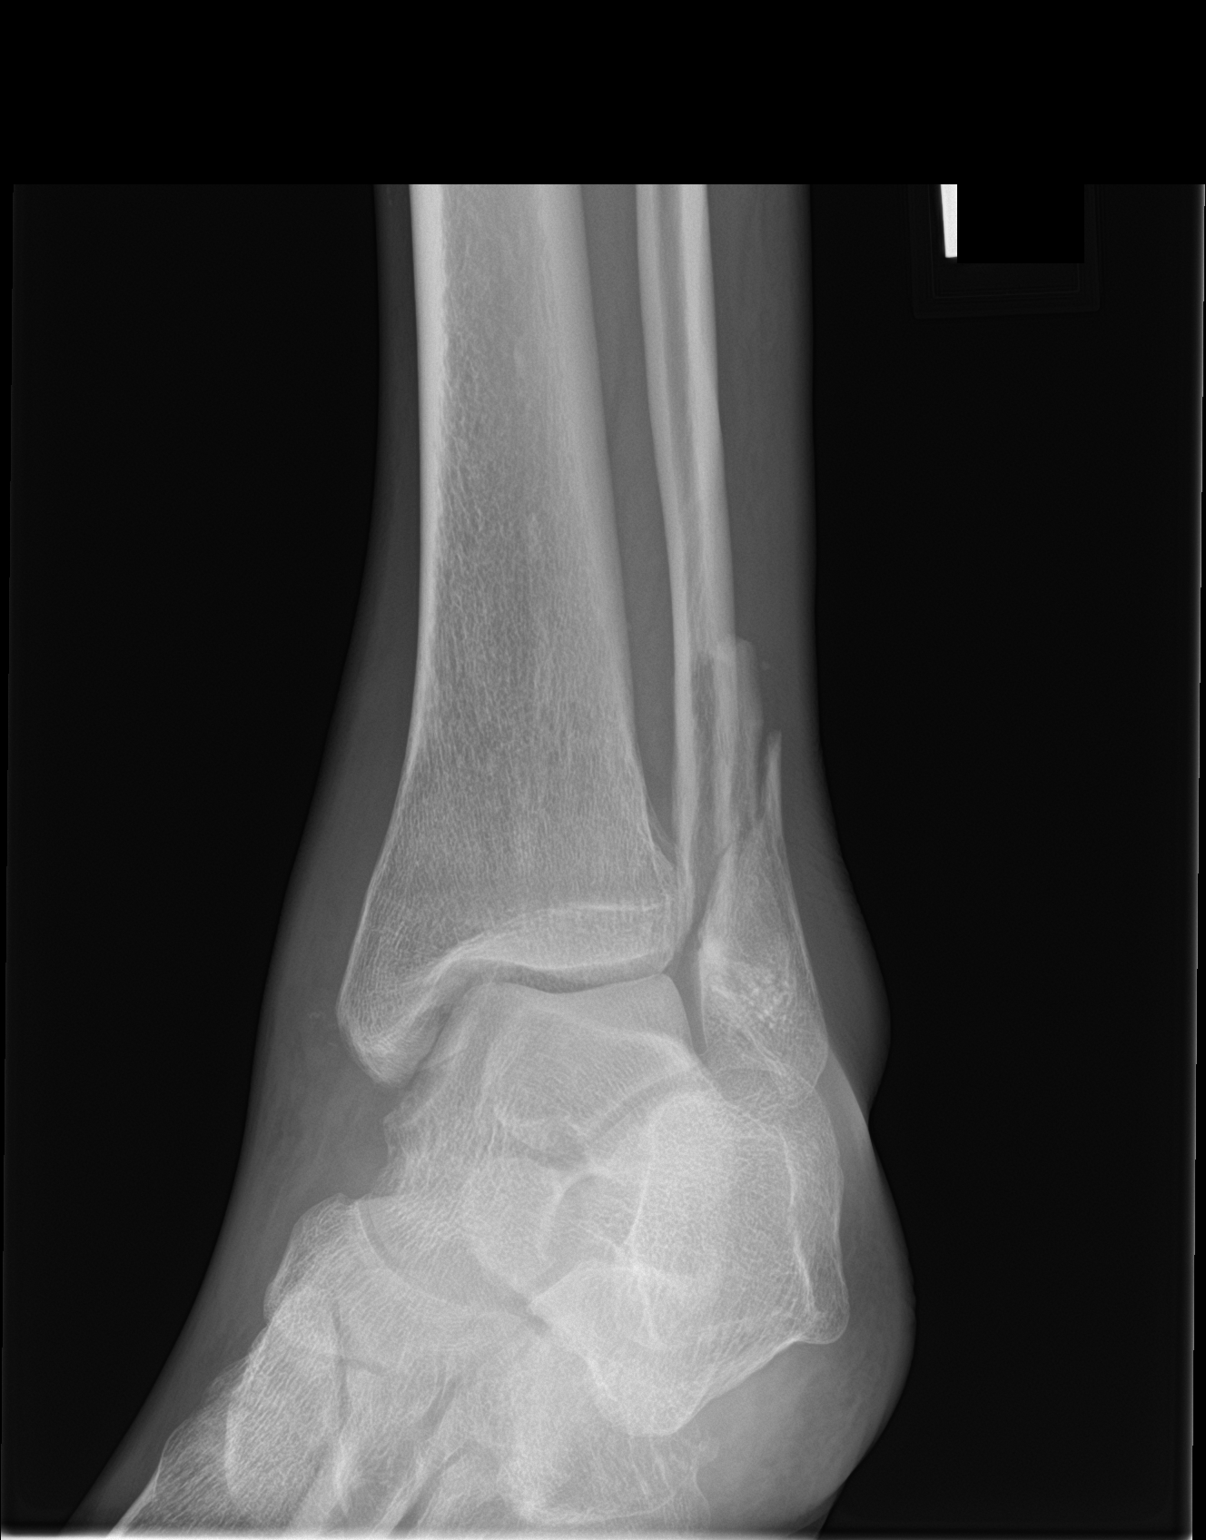

[ankle lat]
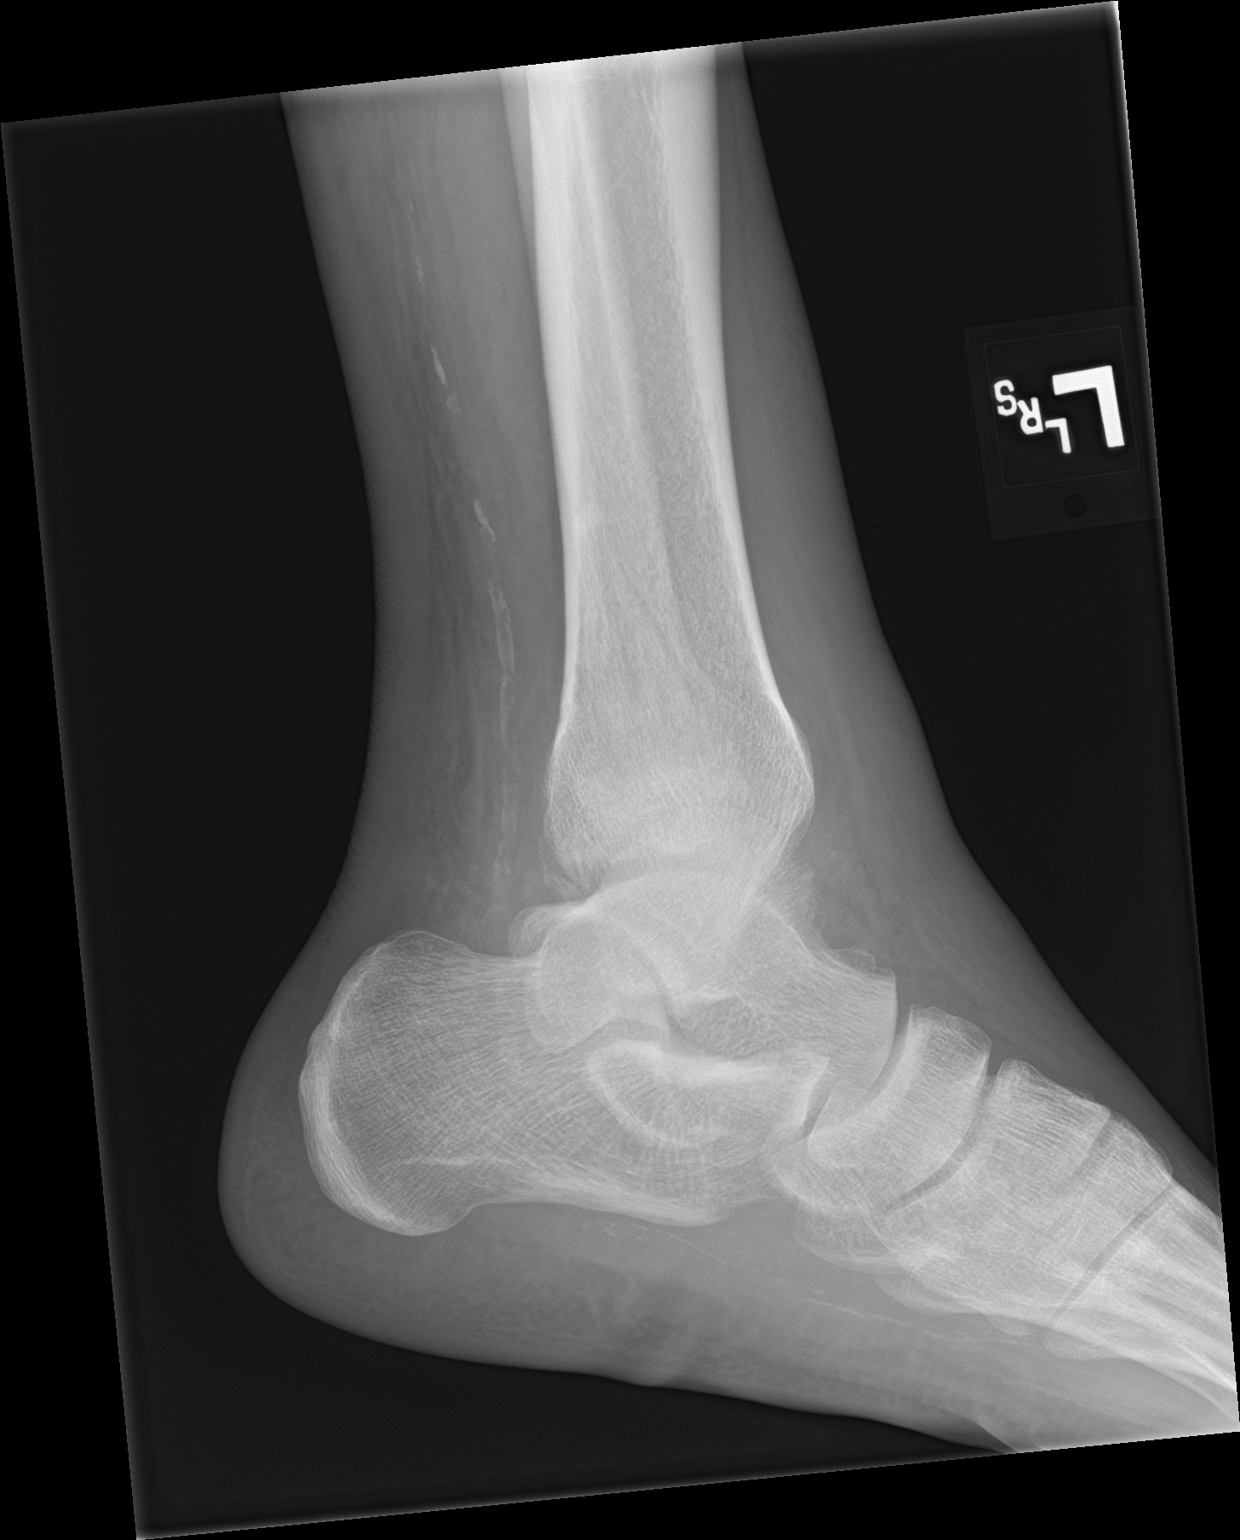

[3 of 3 positions shown; findings below may reference images not displayed]

FINDINGS: Acute fracture involving the distal metaphysis of the fibula with
extension of lucency to the superolateral joint space. About [DATE]
bone with lateral displacement of distal fracture fragment. Small
mildly displaced posterior malleolar fracture. Multiple suspected
fracture fragments between the medial malleolus and the talus. There
may be additional small fracture fragments superficial to the medial
malleolus. There is marked soft tissue swelling. Vascular
calcification.
IMPRESSION: 1. Acute mildly displaced distal fibular fracture with additional
minimally displaced posterior malleolar fracture
2. Multiple suspected osseous/fracture fragments between the medial
malleolus of the tibia and the medial aspect of the talus bone.
